# Patient Record
Sex: Female | Born: 1940
Health system: Southern US, Community
[De-identification: ages and names within clinical notes are randomized; demographics above are authoritative.]

## PROBLEM LIST (undated history)

## (undated) DIAGNOSIS — E785 Hyperlipidemia, unspecified: Secondary | ICD-10-CM

## (undated) DIAGNOSIS — K219 Gastro-esophageal reflux disease without esophagitis: Secondary | ICD-10-CM

## (undated) DIAGNOSIS — R011 Cardiac murmur, unspecified: Secondary | ICD-10-CM

## (undated) DIAGNOSIS — H3530 Unspecified macular degeneration: Secondary | ICD-10-CM

## (undated) HISTORY — PX: COLONOSCOPY: SHX174

## (undated) HISTORY — DX: Gastro-esophageal reflux disease without esophagitis: K21.9

## (undated) HISTORY — DX: Unspecified macular degeneration: H35.30

## (undated) HISTORY — DX: Hyperlipidemia, unspecified: E78.5

---

## 2005-12-09 ENCOUNTER — Ambulatory Visit: Payer: Self-pay | Admitting: Family Medicine

## 2006-01-03 ENCOUNTER — Encounter: Admission: RE | Admit: 2006-01-03 | Discharge: 2006-01-03 | Payer: Self-pay | Admitting: Family Medicine

## 2006-01-25 ENCOUNTER — Ambulatory Visit: Payer: Self-pay | Admitting: Gastroenterology

## 2006-02-01 ENCOUNTER — Ambulatory Visit: Payer: Self-pay | Admitting: Gastroenterology

## 2006-04-26 ENCOUNTER — Ambulatory Visit: Payer: Self-pay | Admitting: Family Medicine

## 2006-04-26 LAB — CONVERTED CEMR LAB
ALT: 15 units/L (ref 0–40)
Bilirubin, Direct: 0.1 mg/dL (ref 0.0–0.3)
Total Protein: 6.7 g/dL (ref 6.0–8.3)

## 2006-09-28 ENCOUNTER — Ambulatory Visit: Payer: Self-pay | Admitting: Family Medicine

## 2006-09-28 LAB — CONVERTED CEMR LAB
AST: 24 units/L (ref 0–37)
Albumin: 4.2 g/dL (ref 3.5–5.2)
Chloride: 106 meq/L (ref 96–112)
GFR calc non Af Amer: 76 mL/min
Potassium: 5 meq/L (ref 3.5–5.1)
Sodium: 143 meq/L (ref 135–145)

## 2006-10-02 ENCOUNTER — Encounter: Payer: Self-pay | Admitting: Family Medicine

## 2006-10-25 ENCOUNTER — Ambulatory Visit: Payer: Self-pay | Admitting: Internal Medicine

## 2007-01-09 ENCOUNTER — Telehealth (INDEPENDENT_AMBULATORY_CARE_PROVIDER_SITE_OTHER): Payer: Self-pay | Admitting: *Deleted

## 2007-01-16 ENCOUNTER — Ambulatory Visit: Payer: Self-pay | Admitting: Family Medicine

## 2007-01-19 LAB — CONVERTED CEMR LAB
Bilirubin, Direct: 0.1 mg/dL (ref 0.0–0.3)
Cholesterol: 169 mg/dL (ref 0–200)
HDL: 48.6 mg/dL (ref >39.0)
LDL Cholesterol: 90 mg/dL (ref 0–99)
Total CHOL/HDL Ratio: 3.5
Total Protein: 6.6 g/dL (ref 6.0–8.3)

## 2007-01-30 ENCOUNTER — Encounter: Admission: RE | Admit: 2007-01-30 | Discharge: 2007-01-30 | Payer: Self-pay | Admitting: Family Medicine

## 2007-02-01 ENCOUNTER — Encounter (INDEPENDENT_AMBULATORY_CARE_PROVIDER_SITE_OTHER): Payer: Self-pay | Admitting: *Deleted

## 2007-02-13 ENCOUNTER — Ambulatory Visit: Payer: Self-pay | Admitting: Family Medicine

## 2007-02-13 DIAGNOSIS — E785 Hyperlipidemia, unspecified: Secondary | ICD-10-CM | POA: Insufficient documentation

## 2007-04-18 ENCOUNTER — Ambulatory Visit: Payer: Self-pay | Admitting: Family Medicine

## 2007-04-23 LAB — CONVERTED CEMR LAB
Direct LDL: 168.7 mg/dL
HDL: 45.9 mg/dL (ref >39.0)
Total Bilirubin: 0.8 mg/dL (ref 0.3–1.2)
Total Protein: 6.2 g/dL (ref 6.0–8.3)
Triglycerides: 156 mg/dL — ABNORMAL HIGH (ref 0–149)

## 2007-07-30 ENCOUNTER — Ambulatory Visit: Payer: Self-pay | Admitting: Family Medicine

## 2007-08-06 ENCOUNTER — Telehealth (INDEPENDENT_AMBULATORY_CARE_PROVIDER_SITE_OTHER): Payer: Self-pay | Admitting: *Deleted

## 2007-08-06 LAB — CONVERTED CEMR LAB
ALT: 29 units/L (ref 0–35)
AST: 29 units/L (ref 0–37)
Bilirubin, Direct: 0.1 mg/dL (ref 0.0–0.3)
Cholesterol: 210 mg/dL (ref 0–200)
Total Protein: 6.7 g/dL (ref 6.0–8.3)
VLDL: 24 mg/dL (ref 0–40)

## 2007-11-30 ENCOUNTER — Telehealth (INDEPENDENT_AMBULATORY_CARE_PROVIDER_SITE_OTHER): Payer: Self-pay | Admitting: *Deleted

## 2008-02-07 ENCOUNTER — Encounter: Admission: RE | Admit: 2008-02-07 | Discharge: 2008-02-07 | Payer: Self-pay | Admitting: Family Medicine

## 2008-02-11 ENCOUNTER — Encounter (INDEPENDENT_AMBULATORY_CARE_PROVIDER_SITE_OTHER): Payer: Self-pay | Admitting: *Deleted

## 2008-03-19 ENCOUNTER — Telehealth (INDEPENDENT_AMBULATORY_CARE_PROVIDER_SITE_OTHER): Payer: Self-pay | Admitting: *Deleted

## 2008-04-15 ENCOUNTER — Ambulatory Visit: Payer: Self-pay | Admitting: Family Medicine

## 2008-04-15 LAB — CONVERTED CEMR LAB
Albumin: 3.9 g/dL (ref 3.5–5.2)
Alkaline Phosphatase: 66 units/L (ref 39–117)
Total Protein: 6.7 g/dL (ref 6.0–8.3)

## 2008-05-12 ENCOUNTER — Telehealth (INDEPENDENT_AMBULATORY_CARE_PROVIDER_SITE_OTHER): Payer: Self-pay | Admitting: *Deleted

## 2008-05-15 ENCOUNTER — Other Ambulatory Visit: Admission: RE | Admit: 2008-05-15 | Discharge: 2008-05-15 | Payer: Self-pay | Admitting: Family Medicine

## 2008-05-15 ENCOUNTER — Encounter: Payer: Self-pay | Admitting: Family Medicine

## 2008-05-15 ENCOUNTER — Ambulatory Visit: Payer: Self-pay | Admitting: Family Medicine

## 2008-05-16 ENCOUNTER — Encounter: Payer: Self-pay | Admitting: Family Medicine

## 2008-05-19 ENCOUNTER — Encounter (INDEPENDENT_AMBULATORY_CARE_PROVIDER_SITE_OTHER): Payer: Self-pay | Admitting: *Deleted

## 2008-05-20 ENCOUNTER — Encounter (INDEPENDENT_AMBULATORY_CARE_PROVIDER_SITE_OTHER): Payer: Self-pay | Admitting: *Deleted

## 2008-05-26 ENCOUNTER — Ambulatory Visit: Payer: Self-pay | Admitting: Family Medicine

## 2008-05-26 LAB — CONVERTED CEMR LAB
Albumin: 4.2 g/dL (ref 3.5–5.2)
Basophils Absolute: 0.1 10*3/uL (ref 0.0–0.1)
Basophils Relative: 1 % (ref 0.0–3.0)
CRP, High Sensitivity: <1 — ABNORMAL LOW (ref 0.00–5.00)
Calcium: 9.5 mg/dL (ref 8.4–10.5)
Chloride: 100 meq/L (ref 96–112)
Creatinine, Ser: 0.7 mg/dL (ref 0.4–1.2)
Direct LDL: 188.1 mg/dL
Eosinophils Absolute: 0.2 10*3/uL (ref 0.0–0.7)
GFR calc Af Amer: 107 mL/min
GFR calc non Af Amer: 88 mL/min
HCT: 42.5 % (ref 36.0–46.0)
HDL: 55.1 mg/dL (ref >39.0)
MCHC: 34.2 g/dL (ref 30.0–36.0)
MCV: 93.5 fL (ref 78.0–100.0)
Monocytes Absolute: 0.6 10*3/uL (ref 0.1–1.0)
Neutro Abs: 4 10*3/uL (ref 1.4–7.7)
Neutrophils Relative %: 58.7 % (ref 43.0–77.0)
RBC: 4.55 M/uL (ref 3.87–5.11)
TSH: 3.14 microintl units/mL (ref 0.35–5.50)
Total Bilirubin: 0.9 mg/dL (ref 0.3–1.2)
Total CK: 88 units/L (ref 7–177)

## 2008-07-25 ENCOUNTER — Telehealth: Payer: Self-pay | Admitting: Family Medicine

## 2008-08-14 ENCOUNTER — Ambulatory Visit: Payer: Self-pay | Admitting: Family Medicine

## 2008-08-14 DIAGNOSIS — R109 Unspecified abdominal pain: Secondary | ICD-10-CM | POA: Insufficient documentation

## 2008-08-14 DIAGNOSIS — R079 Chest pain, unspecified: Secondary | ICD-10-CM

## 2008-08-14 LAB — CONVERTED CEMR LAB
Glucose, Urine, Semiquant: NEGATIVE
Nitrite: NEGATIVE
Specific Gravity, Urine: 1.01

## 2008-08-15 ENCOUNTER — Encounter: Payer: Self-pay | Admitting: Family Medicine

## 2008-08-20 ENCOUNTER — Encounter: Admission: RE | Admit: 2008-08-20 | Discharge: 2008-08-20 | Payer: Self-pay | Admitting: Family Medicine

## 2008-08-24 LAB — CONVERTED CEMR LAB
Alkaline Phosphatase: 69 units/L (ref 39–117)
Basophils Absolute: 0 10*3/uL (ref 0.0–0.1)
Bilirubin, Direct: 0.1 mg/dL (ref 0.0–0.3)
Calcium: 9.5 mg/dL (ref 8.4–10.5)
GFR calc Af Amer: 107 mL/min
Glucose, Bld: 93 mg/dL (ref 70–99)
HCT: 40.9 % (ref 36.0–46.0)
Lipase: 35 units/L (ref 11.0–59.0)
Lymphocytes Relative: 29.3 % (ref 12.0–46.0)
MCHC: 34.5 g/dL (ref 30.0–36.0)
Monocytes Absolute: 0.3 10*3/uL (ref 0.1–1.0)
Monocytes Relative: 3.5 % (ref 3.0–12.0)
Neutro Abs: 4.5 10*3/uL (ref 1.4–7.7)
Platelets: 237 10*3/uL (ref 150–400)
Potassium: 4.4 meq/L (ref 3.5–5.1)
RDW: 12.5 % (ref 11.5–14.6)
Sodium: 140 meq/L (ref 135–145)
Total Bilirubin: 0.6 mg/dL (ref 0.3–1.2)
Total Protein: 6.8 g/dL (ref 6.0–8.3)

## 2008-08-25 ENCOUNTER — Telehealth (INDEPENDENT_AMBULATORY_CARE_PROVIDER_SITE_OTHER): Payer: Self-pay | Admitting: *Deleted

## 2008-08-25 ENCOUNTER — Encounter (INDEPENDENT_AMBULATORY_CARE_PROVIDER_SITE_OTHER): Payer: Self-pay | Admitting: *Deleted

## 2008-08-26 ENCOUNTER — Ambulatory Visit: Payer: Self-pay | Admitting: Family Medicine

## 2008-08-27 LAB — CONVERTED CEMR LAB
ALT: 26 units/L (ref 0–35)
AST: 27 units/L (ref 0–37)
Alkaline Phosphatase: 68 units/L (ref 39–117)
Bilirubin, Direct: 0.1 mg/dL (ref 0.0–0.3)
Total Bilirubin: 0.7 mg/dL (ref 0.3–1.2)

## 2008-08-28 ENCOUNTER — Encounter (INDEPENDENT_AMBULATORY_CARE_PROVIDER_SITE_OTHER): Payer: Self-pay | Admitting: *Deleted

## 2008-09-02 LAB — HM COLONOSCOPY

## 2008-09-04 ENCOUNTER — Ambulatory Visit: Payer: Self-pay | Admitting: Family Medicine

## 2008-09-04 DIAGNOSIS — J029 Acute pharyngitis, unspecified: Secondary | ICD-10-CM

## 2008-09-08 ENCOUNTER — Telehealth (INDEPENDENT_AMBULATORY_CARE_PROVIDER_SITE_OTHER): Payer: Self-pay | Admitting: *Deleted

## 2008-09-09 ENCOUNTER — Ambulatory Visit: Payer: Self-pay | Admitting: Family Medicine

## 2008-11-24 ENCOUNTER — Telehealth (INDEPENDENT_AMBULATORY_CARE_PROVIDER_SITE_OTHER): Payer: Self-pay | Admitting: *Deleted

## 2009-01-28 ENCOUNTER — Ambulatory Visit: Payer: Self-pay | Admitting: Family Medicine

## 2009-01-28 DIAGNOSIS — N390 Urinary tract infection, site not specified: Secondary | ICD-10-CM | POA: Insufficient documentation

## 2009-01-28 LAB — CONVERTED CEMR LAB
Nitrite: NEGATIVE
Protein, U semiquant: NEGATIVE
Specific Gravity, Urine: 1.01
Urobilinogen, UA: 0.2

## 2009-01-29 ENCOUNTER — Encounter: Payer: Self-pay | Admitting: Family Medicine

## 2009-01-29 LAB — CONVERTED CEMR LAB: WBC, UA: NONE SEEN cells/hpf (ref <3)

## 2009-02-01 LAB — CONVERTED CEMR LAB
ALT: 23 units/L (ref 0–35)
Albumin: 4.2 g/dL (ref 3.5–5.2)
BUN: 15 mg/dL (ref 6–23)
Bilirubin, Direct: 0.1 mg/dL (ref 0.0–0.3)
Chloride: 106 meq/L (ref 96–112)
GFR calc non Af Amer: 88.16 mL/min (ref >60)
Potassium: 4.2 meq/L (ref 3.5–5.1)
Sodium: 142 meq/L (ref 135–145)
Total Protein: 7.2 g/dL (ref 6.0–8.3)

## 2009-02-02 ENCOUNTER — Telehealth (INDEPENDENT_AMBULATORY_CARE_PROVIDER_SITE_OTHER): Payer: Self-pay | Admitting: *Deleted

## 2009-02-12 ENCOUNTER — Encounter (INDEPENDENT_AMBULATORY_CARE_PROVIDER_SITE_OTHER): Payer: Self-pay | Admitting: *Deleted

## 2009-02-12 ENCOUNTER — Telehealth (INDEPENDENT_AMBULATORY_CARE_PROVIDER_SITE_OTHER): Payer: Self-pay | Admitting: *Deleted

## 2009-02-16 ENCOUNTER — Ambulatory Visit: Payer: Self-pay | Admitting: Family Medicine

## 2009-02-16 LAB — CONVERTED CEMR LAB
Nitrite: NEGATIVE
Specific Gravity, Urine: 1.01

## 2009-02-18 ENCOUNTER — Encounter: Admission: RE | Admit: 2009-02-18 | Discharge: 2009-02-18 | Payer: Self-pay | Admitting: Family Medicine

## 2009-02-19 ENCOUNTER — Encounter (INDEPENDENT_AMBULATORY_CARE_PROVIDER_SITE_OTHER): Payer: Self-pay | Admitting: *Deleted

## 2009-03-20 ENCOUNTER — Telehealth (INDEPENDENT_AMBULATORY_CARE_PROVIDER_SITE_OTHER): Payer: Self-pay | Admitting: *Deleted

## 2009-04-28 ENCOUNTER — Ambulatory Visit: Payer: Self-pay | Admitting: Family Medicine

## 2009-04-28 ENCOUNTER — Telehealth (INDEPENDENT_AMBULATORY_CARE_PROVIDER_SITE_OTHER): Payer: Self-pay | Admitting: *Deleted

## 2009-04-28 DIAGNOSIS — B372 Candidiasis of skin and nail: Secondary | ICD-10-CM | POA: Insufficient documentation

## 2009-04-28 LAB — CONVERTED CEMR LAB
Ketones, urine, test strip: NEGATIVE
Nitrite: NEGATIVE
Urobilinogen, UA: 0.2
WBC Urine, dipstick: NEGATIVE

## 2009-06-29 ENCOUNTER — Telehealth (INDEPENDENT_AMBULATORY_CARE_PROVIDER_SITE_OTHER): Payer: Self-pay | Admitting: *Deleted

## 2009-07-22 ENCOUNTER — Ambulatory Visit: Payer: Self-pay | Admitting: Family Medicine

## 2009-07-22 DIAGNOSIS — R300 Dysuria: Secondary | ICD-10-CM

## 2009-07-22 LAB — CONVERTED CEMR LAB
Bilirubin Urine: NEGATIVE
Ketones, urine, test strip: NEGATIVE
Nitrite: POSITIVE
Protein, U semiquant: NEGATIVE
Urobilinogen, UA: 0.2

## 2009-07-23 ENCOUNTER — Encounter: Payer: Self-pay | Admitting: Family Medicine

## 2009-07-23 LAB — CONVERTED CEMR LAB
ALT: 20 units/L (ref 0–35)
AST: 25 units/L (ref 0–37)
Alkaline Phosphatase: 71 units/L (ref 39–117)
Bilirubin, Direct: 0 mg/dL (ref 0.0–0.3)
Total Bilirubin: 0.8 mg/dL (ref 0.3–1.2)

## 2009-07-27 ENCOUNTER — Telehealth (INDEPENDENT_AMBULATORY_CARE_PROVIDER_SITE_OTHER): Payer: Self-pay | Admitting: *Deleted

## 2009-08-04 ENCOUNTER — Telehealth: Payer: Self-pay | Admitting: Family Medicine

## 2009-08-24 ENCOUNTER — Telehealth (INDEPENDENT_AMBULATORY_CARE_PROVIDER_SITE_OTHER): Payer: Self-pay | Admitting: *Deleted

## 2009-09-07 ENCOUNTER — Telehealth: Payer: Self-pay | Admitting: Family Medicine

## 2009-10-13 ENCOUNTER — Telehealth: Payer: Self-pay | Admitting: Family Medicine

## 2009-11-27 ENCOUNTER — Telehealth: Payer: Self-pay | Admitting: Family Medicine

## 2009-11-27 DIAGNOSIS — D239 Other benign neoplasm of skin, unspecified: Secondary | ICD-10-CM | POA: Insufficient documentation

## 2009-12-04 ENCOUNTER — Ambulatory Visit: Payer: Self-pay | Admitting: Family Medicine

## 2009-12-07 ENCOUNTER — Encounter: Payer: Self-pay | Admitting: Family Medicine

## 2009-12-07 LAB — CONVERTED CEMR LAB
ALT: 16 units/L (ref 0–35)
Albumin: 4.1 g/dL (ref 3.5–5.2)
Total Protein: 6.5 g/dL (ref 6.0–8.3)

## 2009-12-21 ENCOUNTER — Telehealth (INDEPENDENT_AMBULATORY_CARE_PROVIDER_SITE_OTHER): Payer: Self-pay | Admitting: *Deleted

## 2010-02-09 ENCOUNTER — Telehealth: Payer: Self-pay | Admitting: Family Medicine

## 2010-02-11 LAB — HM MAMMOGRAPHY: HM Mammogram: NEGATIVE

## 2010-02-19 ENCOUNTER — Encounter: Admission: RE | Admit: 2010-02-19 | Discharge: 2010-02-19 | Payer: Self-pay | Admitting: Family Medicine

## 2010-03-11 ENCOUNTER — Telehealth (INDEPENDENT_AMBULATORY_CARE_PROVIDER_SITE_OTHER): Payer: Self-pay | Admitting: *Deleted

## 2010-05-10 ENCOUNTER — Ambulatory Visit (HOSPITAL_BASED_OUTPATIENT_CLINIC_OR_DEPARTMENT_OTHER): Admission: RE | Admit: 2010-05-10 | Discharge: 2010-05-10 | Payer: Self-pay | Admitting: Family Medicine

## 2010-05-10 ENCOUNTER — Ambulatory Visit: Payer: Self-pay | Admitting: Diagnostic Radiology

## 2010-05-10 ENCOUNTER — Ambulatory Visit: Payer: Self-pay | Admitting: Family Medicine

## 2010-05-10 DIAGNOSIS — M25559 Pain in unspecified hip: Secondary | ICD-10-CM

## 2010-05-17 ENCOUNTER — Ambulatory Visit: Payer: Self-pay | Admitting: Family Medicine

## 2010-06-02 ENCOUNTER — Telehealth: Payer: Self-pay | Admitting: Family Medicine

## 2010-06-04 ENCOUNTER — Encounter: Payer: Self-pay | Admitting: Family Medicine

## 2010-06-14 ENCOUNTER — Ambulatory Visit: Payer: Self-pay | Admitting: Family Medicine

## 2010-06-15 ENCOUNTER — Ambulatory Visit: Payer: Self-pay | Admitting: Family Medicine

## 2010-06-15 DIAGNOSIS — E039 Hypothyroidism, unspecified: Secondary | ICD-10-CM

## 2010-06-16 LAB — CONVERTED CEMR LAB
Free T4: 0.9 ng/dL (ref 0.60–1.60)
T3, Free: 3.3 pg/mL (ref 2.3–4.2)

## 2010-07-15 ENCOUNTER — Telehealth (INDEPENDENT_AMBULATORY_CARE_PROVIDER_SITE_OTHER): Payer: Self-pay | Admitting: *Deleted

## 2010-08-03 NOTE — Progress Notes (Signed)
Summary: samples  Phone Note Call from Patient Call back at Home Phone [phone]   Caller: Patient Summary of Call: patient is scheduled for lab [phone] - she wants to know if she can have sample of crestor 10 mg - she only has a few left  patient wants referral to dermatol - she has a mole - she said she discussed this with drllowne on last visit - i explained if it wasnt noted she may need appt -- i didnt see a mention of a mole in notes   Initial call taken by: Okey Regal Spring,  Nov 27, 2009 8:45 AM  Follow-up for Phone Call        reviewed OV notes did not see were mole discuss. Ok to refer to derm ? ok to give samples. pls advise..............Marland KitchenFelecia Deloach CMA  Nov 27, 2009 9:40 AM   Additional Follow-up for Phone Call Additional follow up Details #1::        ok to give samples referral put in Additional Follow-up by: Loreen Freud DO,  Nov 27, 2009 10:28 AM  New Problems: MOLE (ICD-216.9)   Additional Follow-up for Phone Call Additional follow up Details #2::    pt aware samples placed up front, awaiting appt info for referral.......Marland KitchenFelecia Deloach CMA  Nov 27, 2009 11:16 AM   New Problems: MOLE (ICD-216.9)

## 2010-08-03 NOTE — Progress Notes (Signed)
Summary: lab results  Phone Note Outgoing Call   Call placed by: [facility] CMA,  July 27, 2009 9:54 AM Summary of Call: left message to call  office...............Marland KitchenFelecia Deloach CMA  July 27, 2009 9:54 AM   + uti --on cipro  Follow-up for Phone Call        pt aware............Marland KitchenFelecia Deloach CMA  July 27, 2009 10:08 AM

## 2010-08-03 NOTE — Progress Notes (Signed)
Summary: refill - ambien  Phone Note Refill Request Message from:  Fax from Pharmacy on June 02, 2010 8:15 AM  Refills Requested: Medication #1:  AMBIEN 10 MG  TABS 1 every 3rd night as needed. [facility] [place] - fax [phone]  Initial call taken by: Okey Regal Spring,  June 02, 2010 8:15 AM  Follow-up for Phone Call        last seen 02/09/10 and filled 05/10/10.Marland KitchenMarland Kitchenplease advise Follow-up by: Almeta Monas CMA Duncan Dull),  June 02, 2010 8:48 AM  Additional Follow-up for Phone Call Additional follow up Details #1::        if filled on 11 / 7 she is taking it every night and she should not be----  med list says refill was 8/9-- if that is correct--ok to refill Additional Follow-up by: Loreen Freud DO,  June 02, 2010 9:12 AM    Additional Follow-up for Phone Call Additional follow up Details #2::    sorry last filled 02/09/10 and seen 05/10/10 Follow-up by: Almeta Monas CMA Duncan Dull),  June 02, 2010 10:00 AM  Additional Follow-up for Phone Call Additional follow up Details #3:: Details for Additional Follow-up Action Taken: ok to refill x1 Additional Follow-up by: Loreen Freud DO,  June 02, 2010 11:12 AM  Prescriptions: AMBIEN 10 MG  TABS (ZOLPIDEM TARTRATE) 1 every 3rd night as needed.  #30 x 0   Entered by:   Almeta Monas CMA (AAMA)   Authorized by:   Loreen Freud DO   Signed by:   Almeta Monas CMA (AAMA) on 06/02/2010   Method used:   Printed then faxed to ...       [facility] [phone]* (retail)       [address]       [place], [place]  [address]       Ph: [phone]       Fax: [phone]   RxID:   [id number]

## 2010-08-03 NOTE — Assessment & Plan Note (Signed)
Summary: ROV/labwork/drb   Vital Signs:  Patient profile:   70 year old female Height:      65 inches Weight:      150.13 pounds BMI:     25.07 Temp:     97.2 degrees F oral Pulse rate:   82 / minute Pulse rhythm:   regular BP sitting:   128 / 78  (left arm) Cuff size:   regular  Vitals Entered By: Army Fossa CMA (July 22, 2009 9:42 AM) CC: Routine follow-up, labwork- pt is fasting. Everything going okay. needs refills. , Dysuria   History of Present Illness:  Hyperlipidemia follow-up      This is a 70 year old woman who presents for Hyperlipidemia follow-up.  The patient denies muscle aches, GI upset, abdominal pain, flushing, itching, constipation, diarrhea, and fatigue.  The patient denies the following symptoms: chest pain/pressure, exercise intolerance, dypsnea, palpitations, syncope, and pedal edema.  Compliance with medications (by patient report) has been near 100%.  Dietary compliance has been good.  The patient reports no exercise.    Dysuria      The patient also presents with Dysuria.  The patient complains of burning with urination and urinary frequency, but denies urgency, hematuria, vaginal discharge, vaginal itching, and vaginal sores.  The patient denies the following associated symptoms: nausea, vomiting, fever, shaking chills, flank pain, abdominal pain, back pain, pelvic pain, and arthralgias.  The patient denies the following risk factors: diabetes, prior antibiotics, immunosuppression, history of GU anomaly, history of pyelonephritis, pregnancy, history of STD, and analgesic abuse.    Allergies (verified): No Known Drug Allergies  Past History:  Past medical, surgical, family and social histories (including risk factors) reviewed for relevance to current acute and chronic problems.  Past Medical History: Reviewed history from 02/13/2007 and no changes required. Hyperlipidemia  Past Surgical History: Reviewed history from 05/15/2008 and no changes  required. Denies surgical history  Family History: Reviewed history from 05/15/2008 and no changes required. none  Social History: Reviewed history from 05/15/2008 and no changes required. Retired Married Never Smoked Alcohol use-yes Drug use-no Regular exercise-yes  Review of Systems      See HPI  Physical Exam  General:  Well-developed,well-nourished,in no acute distress; alert,appropriate and cooperative throughout examination Lungs:  Normal respiratory effort, chest expands symmetrically. Lungs are clear to auscultation, no crackles or wheezes. Heart:  Normal rate and regular rhythm. S1 and S2 normal without gallop, murmur, click, rub or other extra sounds. Extremities:  No clubbing, cyanosis, edema, or deformity noted with normal full range of motion of all joints.   Psych:  Oriented X3 and normally interactive.     Impression & Recommendations:  Problem # 1:  HYPERLIPIDEMIA (ICD-272.4)  Her updated medication list for this problem includes:    Simvastatin 40 Mg Tabs (Simvastatin) .Marland Kitchen... Take 1 tab once daily at bedtime. needs office visit before additional refills.  Orders: Venipuncture (98119) TLB-Hepatic/Liver Function Pnl (80076-HEPATIC) T-NMR, Lipoprofile (14782-95621) UA Dipstick w/o Micro (manual) (30865)  Labs Reviewed: SGOT: 29 (01/28/2009)   SGPT: 23 (01/28/2009)   HDL:55.1 (05/15/2008), 55.4 (07/30/2007)  LDL:DEL (05/15/2008), DEL (07/30/2007)  Chol:294 (05/15/2008), 210 (07/30/2007)  Trig:124 (05/15/2008), 121 (07/30/2007)  Problem # 2:  DYSURIA (ICD-788.1)  Her updated medication list for this problem includes:    Cipro 500 Mg Tabs (Ciprofloxacin hcl) .Marland Kitchen... 1 by mouth two times a day  Encouraged to push clear liquids, get enough rest, and take acetaminophen as needed. To be seen in 10 days if no  improvement, sooner if worse.  Orders: UA Dipstick w/o Micro (manual) (65784)  Complete Medication List: 1)  Ambien 10 Mg Tabs (Zolpidem tartrate) .Marland Kitchen..  1 every 3rd night as needed. 2)  Simvastatin 40 Mg Tabs (Simvastatin) .... Take 1 tab once daily at bedtime. needs office visit before additional refills. 3)  Cipro 500 Mg Tabs (Ciprofloxacin hcl) .Marland Kitchen.. 1 by mouth two times a day  Other Orders: T-Culture, Urine ([id number]) Prescriptions: CIPRO 500 MG TABS (CIPROFLOXACIN HCL) 1 by mouth two times a day  #10 x 0   Entered and Authorized by:   Loreen Freud DO   Signed by:   Loreen Freud DO on 07/22/2009   Method used:   Electronically to        [facility] [phone]* (retail)       [address]       [place], [place]  [address]       Ph: [phone]       Fax: [phone]   RxID:   [id number]   Laboratory Results   Urine Tests    Routine Urinalysis   Color: yellow Appearance: Clear Glucose: negative   (Normal Range: Negative) Bilirubin: negative   (Normal Range: Negative) Ketone: negative   (Normal Range: Negative) Spec. Gravity: 1.015   (Normal Range: 1.003-1.035) Blood: small   (Normal Range: Negative) pH: 6.5   (Normal Range: 5.0-8.0) Protein: negative   (Normal Range: Negative) Urobilinogen: 0.2   (Normal Range: 0-1) Nitrite: positive   (Normal Range: Negative) Leukocyte Esterace: negative   (Normal Range: Negative)    Comments: Army Fossa CMA  July 22, 2009 10:06 AM

## 2010-08-03 NOTE — Progress Notes (Signed)
Summary: med reaction  Phone Note Call from Patient Call back at Home Phone [phone]   Caller: Mom Summary of Call: pt states that she was given sample of crestor. pt is on her 3rd week of samples and is experience pain in her legs. pt states that she may not be able to tolerate a rx of this med. pls advise..............Marland KitchenFelecia Deloach CMA  August 24, 2009 11:19 AM   Follow-up for Phone Call        break them in half and take every other day---see if helps Follow-up by: Loreen Freud DO,  August 24, 2009 12:04 PM  Additional Follow-up for Phone Call Additional follow up Details #1::        left message to call  office............Marland KitchenFelecia Deloach CMA  August 24, 2009 12:24 PM  pt return call left message to call office...........Marland KitchenFelecia Deloach CMA  August 24, 2009 2:24 PM  pt aware, pt given 2 week samples of 10mg  and instructed to take 1 tab every other day. pt will pick samples up tomorrow............Marland KitchenFelecia Deloach CMA  August 24, 2009 3:48 PM

## 2010-08-03 NOTE — Progress Notes (Signed)
Summary: Refill Request  Phone Note Refill Request Message from:  [facility] Fax #: [phone]  Refills Requested: Medication #1:  AMBIEN 10 MG  TABS 1 every 3rd night as needed.   Dosage confirmed as above?Dosage Confirmed   Brand Name Necessary? No   Supply Requested: 1 month   Last Refilled: 06/29/2009 Next Appointment Scheduled: none Initial call taken by: Harold Barban,  September 07, 2009 10:09 AM  Follow-up for Phone Call        last ov- 07/22/09 ok to refill x1 Follow-up by: Loreen Freud DO,  September 07, 2009 11:23 AM    Prescriptions: AMBIEN 10 MG  TABS (ZOLPIDEM TARTRATE) 1 every 3rd night as needed.  #10 x 0   Entered by:   Army Fossa CMA   Authorized by:   Loreen Freud DO   Signed by:   Army Fossa CMA on 09/07/2009   Method used:   Printed then faxed to ...       [facility] [phone]* (retail)       [address]       [place], [place]  [address]       Ph: [phone]       Fax: [phone]   RxID:   [id number]

## 2010-08-03 NOTE — Progress Notes (Signed)
Summary: Lab Results  Phone Note Outgoing Call   Call placed by: Army Fossa CMA,  December 21, 2009 11:51 AM Reason for Call: Discuss lab or test results Summary of Call: Regarding lab results, no answering machine  con't current meds---LDL particle # high---add niaspan 500 mg #30  2 refills----  1 by mouth at bedtime---take with aspirin or tylenol to decrease flushing and take with low fat snack.  Recheck 3 months----272.4 NMR, hep Signed by Loreen Freud DO on 12/18/2009 at 10:17 PM   Follow-up for Phone Call        Left message for pt to call back. Army Fossa CMA  December 23, 2009 10:11 AM   Additional Follow-up for Phone Call Additional follow up Details #1::        PATIENT DID  RETUREN YOUR CALL AT 12:10---WILL CALL YOU BACK AFTER 1:00PM TODAY Additional Follow-up by: Jerolyn Shin,  December 23, 2009 12:11 PM    Additional Follow-up for Phone Call Additional follow up Details #2::    Spoke with pt she is aware of results and medication. Army Fossa CMA  December 23, 2009 2:38 PM mailed pt a copy in the mail. Army Fossa CMA  December 23, 2009 2:38 PM   New/Updated Medications: NIASPAN 500 MG CR-TABS (NIACIN (ANTIHYPERLIPIDEMIC)) 1 by mouth at bedtime. )Prescriptions: NIASPAN 500 MG CR-TABS (NIACIN (ANTIHYPERLIPIDEMIC)) 1 by mouth at bedtime.  #30 x 2   Entered by:   Army Fossa CMA   Authorized by:   Loreen Freud DO   Signed by:   Army Fossa CMA on 12/23/2009   Method used:   Electronically to        [facility] [phone]* (retail)       [address]       [place], [place]  [address]       Ph: [phone]       Fax: [phone]   RxID:   [id number]

## 2010-08-03 NOTE — Progress Notes (Signed)
Summary: Meds  Phone Note Outgoing Call   Summary of Call: I spoke with pt about her labwork- she states that Crestor is to expensive. Please advise. Army Fossa CMA  August 04, 2009 4:51 PM   Follow-up for Phone Call        give samples and pt assistance form  Follow-up by: Loreen Freud DO,  August 04, 2009 5:12 PM  Additional Follow-up for Phone Call Additional follow up Details #1::        Pt is going to pick everything up today. Army Fossa CMA  August 05, 2009 8:45 AM

## 2010-08-03 NOTE — Progress Notes (Signed)
Summary: Refill(lmom 4/12)  Phone Note Refill Request   Refills Requested: Medication #1:  AMBIEN 10 MG  TABS 1 every 3rd night as needed.   Supply Requested: 1 month   Last Refilled: 09/07/2009  Medication #2:  CRESTOR 10 MG TABS 1 by mouth every other day.. Pt states she would like a 30 day supply because she is having to pay the same price for 10 as she would for 30. Please advise.  Initial call taken by: Army Fossa CMA,  October 13, 2009 8:30 AM  Follow-up for Phone Call        ok to give #30 on ambien Follow-up by: Loreen Freud DO,  October 13, 2009 11:27 AM  Additional Follow-up for Phone Call Additional follow up Details #1::        left message for pt, need to know what pharm. It is time for her to have labwork done also. Army Fossa CMA  October 13, 2009 11:31 AM     Additional Follow-up for Phone Call Additional follow up Details #2::    Pt is aware. Army Fossa CMA  October 13, 2009 12:52 PM   New/Updated Medications: CRESTOR 10 MG TABS (ROSUVASTATIN CALCIUM) 1 by mouth every other day. Prescriptions: CRESTOR 10 MG TABS (ROSUVASTATIN CALCIUM) 1 by mouth every other day.  #15 x 0   Entered by:   Army Fossa CMA   Authorized by:   Loreen Freud DO   Signed by:   Army Fossa CMA on 10/13/2009   Method used:   Printed then faxed to ...       [facility] [phone]* (retail)       [address]       [place], [place]  [address]       Ph: [phone]       Fax: [phone]   RxID:   [id number] AMBIEN 10 MG  TABS (ZOLPIDEM TARTRATE) 1 every 3rd night as needed.  #30 x 0   Entered by:   Army Fossa CMA   Authorized by:   Loreen Freud DO   Signed by:   Army Fossa CMA on 10/13/2009   Method used:   Printed then faxed to ...       [facility]  [facility] [phone]* (retail)       [address]       [place], [place]  [address]       Ph: [phone]       Fax: [phone]   RxID:    [id number]

## 2010-08-03 NOTE — Assessment & Plan Note (Signed)
Summary: FOR HIP PAIN//PH   Vital Signs:  Patient profile:   70 year old female Height:      65 inches Weight:      150.6 pounds BMI:     25.15 Pulse rate:   68 / minute Pulse rhythm:   regular BP sitting:   124 / 70  (left arm) Cuff size:   regular  Vitals Entered By: Almeta Monas CMA Duncan Dull) (May 10, 2010 1:37 PM) CC: x1week ago pt fell down the stairs c/o right hip and buttocks pain Pain Assessment Patient in pain? yes     Location: hip Intensity: 8 Type: aching Onset of pain  Gradual   History of Present Illness:  Injury      This is a 70 year old woman who presents with An injury.  The symptoms began 1 week ago.  Pt fell about 1 week ago down about 10 steps and now she c/o R hip and buttock pain.  The patient reports injury to the right hip, but denies injury to the head, face, neck, left arm, right arm, left elbow, right elbow, left forearm, right forearm, chest, back, abdomen, left hip, left thigh, right thigh, left knee, right knee, left leg, right leg, left ankle, right ankle, left foot, and right foot.  The patient denies swelling, redness, tenderness, increased warmth deformity, blood loss, numbness, weakness, loss of sensation, coolness of extremity, and loss of consciousness.  The patient denies the following risk factors for significant bleeding: aspirin use, anticoagulant use, and history of bleeding disorder.  Screening for risk of abuse was negative.    Current Medications (verified): 1)  Ambien 10 Mg  Tabs (Zolpidem Tartrate) .Marland Kitchen.. 1 Every 3rd Night As Needed. 2)  Crestor 10 Mg Tabs (Rosuvastatin Calcium) .... As Directed 3)  Mobic 15 Mg Tabs (Meloxicam) .... 1/2 -1 By Mouth Once Daily As Needed  Pain  Allergies (verified): 1)  ! Niaspan (Niacin (Antihyperlipidemic))  Past History:  Past Medical History: Last updated: 02/13/2007 Hyperlipidemia  Past Surgical History: Last updated: 05/15/2008 Denies surgical history  Family History: Last  updated: 05/15/2008 none  Social History: Last updated: 05/15/2008 Retired Married Never Smoked Alcohol use-yes Drug use-no Regular exercise-yes  Risk Factors: Alcohol Use: <1 (05/15/2008) Caffeine Use: 2 (05/15/2008) Exercise: yes (05/15/2008)  Risk Factors: Smoking Status: never (05/15/2008)  Family History: Reviewed history from 05/15/2008 and no changes required. none  Social History: Reviewed history from 05/15/2008 and no changes required. Retired Married Never Smoked Alcohol use-yes Drug use-no Regular exercise-yes  Review of Systems      See HPI  Physical Exam  General:  Well-developed,well-nourished,in no acute distress; alert,appropriate and cooperative throughout examination Msk:  normal ROM, no joint tenderness, no joint swelling, no joint warmth, no redness over joints, no joint deformities, and no crepitation.   Extremities:  No clubbing, cyanosis, edema, or deformity noted with normal full range of motion of all joints.   Neurologic:  alert & oriented X3 and strength normal in all extremities.   pain only with weight bearing Psych:  Cognition and judgment appear intact. Alert and cooperative with normal attention span and concentration. No apparent delusions, illusions, hallucinations   Impression & Recommendations:  Problem # 1:  HIP PAIN, RIGHT (ICD-719.45)  Orders: T-Hip Comp Right Min 2 views (73510TC) T-Lumbar Spine 2 Views (72100TC)  Discussed use of medications, application of heat or cold, and exercises.   Her updated medication list for this problem includes:    Mobic 15 Mg  Tabs (Meloxicam) .Marland Kitchen... 1/2 -1 by mouth once daily as needed  pain  Complete Medication List: 1)  Ambien 10 Mg Tabs (Zolpidem tartrate) .Marland Kitchen.. 1 every 3rd night as needed. 2)  Crestor 10 Mg Tabs (Rosuvastatin calcium) .... As directed 3)  Mobic 15 Mg Tabs (Meloxicam) .... 1/2 -1 by mouth once daily as needed  pain  Patient Instructions: 1)  fasting labs  272.4    [facility]-----ov 3 weeks after labs Prescriptions: MOBIC 15 MG TABS (MELOXICAM) 1/2 -1 by mouth once daily as needed  pain  #30 x 0   Entered and Authorized by:   Loreen Freud DO   Signed by:   Loreen Freud DO on 05/10/2010   Method used:   Electronically to        [facility] [phone]* (retail)       [address]       [place], [place]  [address]       Ph: [phone]       Fax: [phone]   RxID:   [id number]    Orders Added: 1)  T-Hip Comp Right Min 2 views [73510TC] 2)  T-Lumbar Spine 2 Views [72100TC] 3)  Est. Patient Level III [24401]

## 2010-08-03 NOTE — Progress Notes (Signed)
Summary: Change Direction  Phone Note Refill Request   Refills Requested: Medication #1:  CRESTOR 10 MG TABS 1 by mouth every other day.  Medication #2:  NIASPAN 500 MG CR-TABS 1 by mouth at bedtime.. Pt would like to get direction change on crestor to states once daily but she will still take every other day. pt would like to have direction change  due to med costing the same for #15 and #30. ok to change direction on med. Pt also wants to let you know that she was unable to take NIASPAN 500 MG due to it causing nausea. pls advise ok to change direction. pt uses [facility]...................Marland KitchenFelecia Deloach CMA  March 11, 2010 11:16 AM    Follow-up for Phone Call        I can not say on directions 1 a day---we can put as directed---but to put 1 a day is considered fraud. Follow-up by: Loreen Freud DO,  March 11, 2010 11:43 AM  Additional Follow-up for Phone Call Additional follow up Details #1::        Spoke with patient, patient ok'd above information and aware rx sent to pharmacy. Niaspan was removed from med list with the notation caused nausea Additional Follow-up by: Shonna Chock CMA,  March 11, 2010 3:58 PM   New Allergies: ! NIASPAN (NIACIN (ANTIHYPERLIPIDEMIC)) New/Updated Medications: CRESTOR 10 MG TABS (ROSUVASTATIN CALCIUM) as directed New Allergies: ! NIASPAN (NIACIN (ANTIHYPERLIPIDEMIC))Prescriptions: CRESTOR 10 MG TABS (ROSUVASTATIN CALCIUM) as directed  #30 x 2   Entered and Authorized by:   Loreen Freud DO   Signed by:   Loreen Freud DO on 03/11/2010   Method used:   Electronically to        [facility] [phone]* (retail)       [address]       [place], [place]  [address]       Ph: [phone]       Fax: [phone]   RxID:   [id number]

## 2010-08-03 NOTE — Progress Notes (Signed)
Summary: refill  Phone Note Refill Request Message from:  Fax from Pharmacy on February 09, 2010 12:58 PM  Refills Requested: Medication #1:  AMBIEN 10 MG  TABS 1 every 3rd night as needed. [facility] [place] - fax [phone]  Initial call taken by: Okey Regal Spring,  February 09, 2010 12:58 PM  Follow-up for Phone Call        last filled 10-13-09 #30, last ov 07-22-09....Marland KitchenMarland KitchenFelecia Deloach CMA  February 09, 2010 1:40 PM   Additional Follow-up for Phone Call Additional follow up Details #1::        ok to refill x Additional Follow-up by: Loreen Freud DO,  February 09, 2010 2:10 PM    Prescriptions: AMBIEN 10 MG  TABS (ZOLPIDEM TARTRATE) 1 every 3rd night as needed.  #30 x 0   Entered by:   Jeremy Johann CMA   Authorized by:   Loreen Freud DO   Signed by:   Jeremy Johann CMA on 02/09/2010   Method used:   Printed then faxed to ...       [facility] [phone]* (retail)       [address]       [place], [place]  [address]       Ph: [phone]       Fax: [phone]   RxID:   [id number]

## 2010-08-05 NOTE — Assessment & Plan Note (Signed)
Summary: REVIEW [facility]//KP   Vital Signs:  Patient profile:   70 year old female Weight:      152.0 pounds Pulse rate:   68 / minute Pulse rhythm:   regular BP sitting:   130 / 70  (left arm) Cuff size:   regular  Vitals Entered By: Almeta Monas CMA Duncan Dull) (June 15, 2010 1:51 PM) CC: review [facility]   History of Present Illness: Pt here to review [facility].  No complaints.    Current Medications (verified): 1)  Ambien 10 Mg  Tabs (Zolpidem Tartrate) .Marland Kitchen.. 1 Every 3rd Night As Needed. 2)  Crestor 10 Mg Tabs (Rosuvastatin Calcium) .... As Directed 3)  Mobic 15 Mg Tabs (Meloxicam) .... 1/2 -1 By Mouth Once Daily As Needed  Pain  Allergies (verified): 1)  ! Niaspan (Niacin (Antihyperlipidemic))  Past History:  Past Medical History: Last updated: 02/13/2007 Hyperlipidemia  Past Surgical History: Last updated: 05/15/2008 Denies surgical history  Family History: Last updated: 05/15/2008 none  Social History: Last updated: 05/15/2008 Retired Married Never Smoked Alcohol use-yes Drug use-no Regular exercise-yes  Risk Factors: Alcohol Use: <1 (05/15/2008) Caffeine Use: 2 (05/15/2008) Exercise: yes (05/15/2008)  Risk Factors: Smoking Status: never (05/15/2008)  Family History: Reviewed history from 05/15/2008 and no changes required. none  Social History: Reviewed history from 05/15/2008 and no changes required. Retired Married Never Smoked Alcohol use-yes Drug use-no Regular exercise-yes  Review of Systems      See HPI  Physical Exam  General:  Well-developed,well-nourished,in no acute distress; alert,appropriate and cooperative throughout examination Psych:  Cognition and judgment appear intact. Alert and cooperative with normal attention span and concentration. No apparent delusions, illusions, hallucinations   Impression & Recommendations:  Problem # 1:  HYPERLIPIDEMIA (ICD-272.4)  Her updated medication list  for this problem includes:    Crestor 10 Mg Tabs (Rosuvastatin calcium) .Marland Kitchen... As directed  Labs Reviewed: SGOT: 21 (12/04/2009)   SGPT: 16 (12/04/2009)   HDL:55.1 (05/15/2008), 55.4 (07/30/2007)  LDL:DEL (05/15/2008), DEL (07/30/2007)  Chol:294 (05/15/2008), 210 (07/30/2007)  Trig:124 (05/15/2008), 121 (07/30/2007)  Problem # 2:  HYPOTHYROIDISM (ICD-244.9)  Orders: Venipuncture ([address]) TLB-TSH (Thyroid Stimulating Hormone) (84443-TSH) TLB-T3, Free (Triiodothyronine) (84481-T3FREE) TLB-T4 (Thyrox), Free [phone]) Specimen Handling (19147)  Labs Reviewed: TSH: 3.14 (05/15/2008)    Chol: 294 (05/15/2008)   HDL: 55.1 (05/15/2008)   LDL: DEL (05/15/2008)   TG: 124 (05/15/2008)  Complete Medication List: 1)  Ambien 10 Mg Tabs (Zolpidem tartrate) .Marland Kitchen.. 1 every 3rd night as needed. 2)  Crestor 10 Mg Tabs (Rosuvastatin calcium) .... As directed 3)  Mobic 15 Mg Tabs (Meloxicam) .... 1/2 -1 by mouth once daily as needed  pain  Patient Instructions: 1)  fasting labs in 3 months---[facility] 272.3     Orders Added: 1)  Venipuncture [36415] 2)  TLB-TSH (Thyroid Stimulating Hormone) [84443-TSH] 3)  TLB-T3, Free (Triiodothyronine) [82956-O1HYQM] 4)  TLB-T4 (Thyrox), Free [57846-NG2X] 5)  Specimen Handling [99000] 6)  Est. Patient Level III [52841]

## 2010-08-05 NOTE — Progress Notes (Signed)
Summary: referral  Phone Note Call from Patient   Caller: Patient Summary of Call: Pt call to report that hip pain is still no better so she would like to be referred to ortho. Pt aware referral put in awaiting appt info..........Marland KitchenFelecia Deloach CMA  July 15, 2010 10:21 AM

## 2010-09-16 ENCOUNTER — Telehealth: Payer: Self-pay | Admitting: Family Medicine

## 2010-09-17 ENCOUNTER — Other Ambulatory Visit (INDEPENDENT_AMBULATORY_CARE_PROVIDER_SITE_OTHER): Payer: Medicare Other

## 2010-09-17 ENCOUNTER — Encounter (INDEPENDENT_AMBULATORY_CARE_PROVIDER_SITE_OTHER): Payer: Self-pay | Admitting: *Deleted

## 2010-09-17 DIAGNOSIS — E783 Hyperchylomicronemia: Secondary | ICD-10-CM

## 2010-09-20 ENCOUNTER — Telehealth (INDEPENDENT_AMBULATORY_CARE_PROVIDER_SITE_OTHER): Payer: Self-pay | Admitting: *Deleted

## 2010-09-21 NOTE — Progress Notes (Signed)
Summary: refiill  Phone Note Refill Request   Refills Requested: Medication #1:  ZOLPIDEM TARYTATE 10MG  TAKE 1 TABLET EVERY 3RD NIGHT AS NEEDED   [facility] [address] [facility] [phone]  Initial call taken by: Okey Regal [place],  September 16, 2010 4:52 PM  Follow-up for Phone Call        last seen 06/15/10 and filled 06/02/10 please advise Follow-up by: Almeta Monas CMA Duncan Dull),  September 16, 2010 5:12 PM  Additional Follow-up for Phone Call Additional follow up Details #1::        refill x1 Additional Follow-up by: Loreen Freud DO,  September 16, 2010 5:27 PM    Prescriptions: AMBIEN 10 MG  TABS (ZOLPIDEM TARTRATE) 1 every 3rd night as needed.  #30 x 0   Entered by:   Almeta Monas CMA (AAMA)   Authorized by:   Loreen Freud DO   Signed by:   Almeta Monas CMA (AAMA) on 09/17/2010   Method used:   Printed then faxed to ...       [facility] [address] [facility] [address] (retail)       [address] [place], [place]  [address]       Ph: [phone]       Fax: [phone]   RxID:   [id number]

## 2010-09-29 ENCOUNTER — Ambulatory Visit: Payer: Medicare Other | Attending: Orthopedic Surgery | Admitting: Physical Therapy

## 2010-09-29 DIAGNOSIS — M2569 Stiffness of other specified joint, not elsewhere classified: Secondary | ICD-10-CM | POA: Insufficient documentation

## 2010-09-29 DIAGNOSIS — IMO0001 Reserved for inherently not codable concepts without codable children: Secondary | ICD-10-CM | POA: Insufficient documentation

## 2010-09-29 DIAGNOSIS — M545 Low back pain, unspecified: Secondary | ICD-10-CM | POA: Insufficient documentation

## 2010-09-29 DIAGNOSIS — M25559 Pain in unspecified hip: Secondary | ICD-10-CM | POA: Insufficient documentation

## 2010-09-30 NOTE — Progress Notes (Signed)
Summary: PT advisement  Phone Note Call from Patient Call back at Home Phone [phone]   Summary of Call: Patient called about referrals (physical therapy) and would like to know if there is a location that she can go to that is close to our office/[employer]. Patient does not want to go to downtown location. She would like advisement from Dr. Laury Axon. Please advise. Initial call taken by: Lucious Groves CMA,  September 20, 2010 3:58 PM  Follow-up for Phone Call        [place] [facility]---did ortho refer her?  She would just need to let them know she wants to go there. Follow-up by: Loreen Freud DO,  September 20, 2010 4:55 PM  Additional Follow-up for Phone Call Additional follow up Details #1::        Left message to call back  Additional Follow-up by: Almeta Monas CMA Duncan Dull),  September 21, 2010 10:07 AM    Additional Follow-up for Phone Call Additional follow up Details #2::    pt aware of the above and stated  and was switched to the [facility] in [place] by Her Ortho..... Follow-up by: Almeta Monas CMA Duncan Dull),  September 21, 2010 10:45 AM

## 2010-10-05 ENCOUNTER — Encounter: Payer: Self-pay | Admitting: Family Medicine

## 2010-10-05 ENCOUNTER — Ambulatory Visit: Payer: Medicare Other | Attending: Orthopedic Surgery | Admitting: Physical Therapy

## 2010-10-05 DIAGNOSIS — M2569 Stiffness of other specified joint, not elsewhere classified: Secondary | ICD-10-CM | POA: Insufficient documentation

## 2010-10-05 DIAGNOSIS — M545 Low back pain, unspecified: Secondary | ICD-10-CM | POA: Insufficient documentation

## 2010-10-05 DIAGNOSIS — M25559 Pain in unspecified hip: Secondary | ICD-10-CM | POA: Insufficient documentation

## 2010-10-05 DIAGNOSIS — IMO0001 Reserved for inherently not codable concepts without codable children: Secondary | ICD-10-CM | POA: Insufficient documentation

## 2010-10-07 ENCOUNTER — Ambulatory Visit: Payer: Medicare Other | Admitting: Physical Therapy

## 2010-10-12 ENCOUNTER — Ambulatory Visit: Payer: Medicare Other | Attending: Orthopedic Surgery | Admitting: Physical Therapy

## 2010-10-14 ENCOUNTER — Ambulatory Visit: Payer: Medicare Other | Attending: Orthopedic Surgery | Admitting: Physical Therapy

## 2010-10-14 DIAGNOSIS — M25559 Pain in unspecified hip: Secondary | ICD-10-CM | POA: Insufficient documentation

## 2010-10-14 DIAGNOSIS — M2569 Stiffness of other specified joint, not elsewhere classified: Secondary | ICD-10-CM | POA: Insufficient documentation

## 2010-10-14 DIAGNOSIS — M545 Low back pain, unspecified: Secondary | ICD-10-CM | POA: Insufficient documentation

## 2010-10-14 DIAGNOSIS — IMO0001 Reserved for inherently not codable concepts without codable children: Secondary | ICD-10-CM | POA: Insufficient documentation

## 2010-10-15 ENCOUNTER — Encounter: Payer: Self-pay | Admitting: Family Medicine

## 2010-10-18 ENCOUNTER — Ambulatory Visit (INDEPENDENT_AMBULATORY_CARE_PROVIDER_SITE_OTHER): Payer: Medicare Other | Admitting: Family Medicine

## 2010-10-18 ENCOUNTER — Encounter: Payer: Self-pay | Admitting: Family Medicine

## 2010-10-18 VITALS — BP 122/80 | HR 84 | Wt 151.8 lb

## 2010-10-18 DIAGNOSIS — E785 Hyperlipidemia, unspecified: Secondary | ICD-10-CM

## 2010-10-18 MED ORDER — PITAVASTATIN CALCIUM 2 MG PO TABS: ORAL_TABLET | ORAL | Status: DC

## 2010-10-18 NOTE — Progress Notes (Signed)
  Subjective:    Patient ID: Joanna Williams, female    DOB: 1940/09/09, 70 y.o.   MRN: [id number]  HPI Pt here to review [facility].  No complaints.  Pt unable to take crestor everyday secondary to myalgias.    Review of Systems  All other systems reviewed and are negative.       Objective:   Physical Exam  Constitutional: She appears well-developed and well-nourished.  Psychiatric: She has a normal mood and affect. Her behavior is normal. Judgment and thought content normal.          Assessment & Plan:

## 2010-10-18 NOTE — Assessment & Plan Note (Signed)
Reviewed [facility] Change crestor to livalo Recheck 3 months

## 2010-10-19 ENCOUNTER — Ambulatory Visit: Payer: Medicare Other | Attending: Orthopedic Surgery | Admitting: Physical Therapy

## 2010-10-19 DIAGNOSIS — M25559 Pain in unspecified hip: Secondary | ICD-10-CM | POA: Insufficient documentation

## 2010-10-19 DIAGNOSIS — M545 Low back pain, unspecified: Secondary | ICD-10-CM | POA: Insufficient documentation

## 2010-10-19 DIAGNOSIS — IMO0001 Reserved for inherently not codable concepts without codable children: Secondary | ICD-10-CM | POA: Insufficient documentation

## 2010-10-19 DIAGNOSIS — M2569 Stiffness of other specified joint, not elsewhere classified: Secondary | ICD-10-CM | POA: Insufficient documentation

## 2010-10-21 ENCOUNTER — Ambulatory Visit: Payer: Medicare Other | Admitting: Physical Therapy

## 2010-10-21 DIAGNOSIS — M2569 Stiffness of other specified joint, not elsewhere classified: Secondary | ICD-10-CM | POA: Insufficient documentation

## 2010-10-21 DIAGNOSIS — M25559 Pain in unspecified hip: Secondary | ICD-10-CM | POA: Insufficient documentation

## 2010-10-21 DIAGNOSIS — M545 Low back pain, unspecified: Secondary | ICD-10-CM | POA: Insufficient documentation

## 2010-10-21 DIAGNOSIS — IMO0001 Reserved for inherently not codable concepts without codable children: Secondary | ICD-10-CM | POA: Insufficient documentation

## 2010-10-26 ENCOUNTER — Ambulatory Visit: Payer: Medicare Other | Admitting: Physical Therapy

## 2010-10-26 DIAGNOSIS — M545 Low back pain, unspecified: Secondary | ICD-10-CM | POA: Insufficient documentation

## 2010-10-26 DIAGNOSIS — IMO0001 Reserved for inherently not codable concepts without codable children: Secondary | ICD-10-CM | POA: Insufficient documentation

## 2010-10-26 DIAGNOSIS — M2569 Stiffness of other specified joint, not elsewhere classified: Secondary | ICD-10-CM | POA: Insufficient documentation

## 2010-10-26 DIAGNOSIS — M25559 Pain in unspecified hip: Secondary | ICD-10-CM | POA: Insufficient documentation

## 2010-10-28 ENCOUNTER — Ambulatory Visit: Payer: Medicare Other | Admitting: Physical Therapy

## 2010-10-29 ENCOUNTER — Ambulatory Visit: Payer: Medicare Other | Admitting: Physical Therapy

## 2010-10-29 DIAGNOSIS — IMO0001 Reserved for inherently not codable concepts without codable children: Secondary | ICD-10-CM | POA: Insufficient documentation

## 2010-10-29 DIAGNOSIS — M25559 Pain in unspecified hip: Secondary | ICD-10-CM | POA: Insufficient documentation

## 2010-10-29 DIAGNOSIS — M2569 Stiffness of other specified joint, not elsewhere classified: Secondary | ICD-10-CM | POA: Insufficient documentation

## 2010-10-29 DIAGNOSIS — M545 Low back pain, unspecified: Secondary | ICD-10-CM | POA: Insufficient documentation

## 2010-11-18 ENCOUNTER — Telehealth: Payer: Self-pay

## 2010-11-18 NOTE — Telephone Encounter (Signed)
Spoke with patient and she is aware of Dr.Lowne recommendations and agreed to it      KP

## 2010-11-18 NOTE — Telephone Encounter (Signed)
otc fish oil or flaxseed oil----4 g a day

## 2010-11-18 NOTE — Telephone Encounter (Signed)
Pt called left msg on triage voicemail that she can't afford Lovaza would like to know if there any other alternative medication.   Dr. Laury Axon, pls advise

## 2010-11-19 NOTE — Letter (Signed)
October 25, 2006    Joanna Perla, DO  [address] [place], [place] [address]   RE:  [name], [name]  MRN:  [id number]  /  DOB:  1941-05-12   Dear Myrene Buddy:   Somehow [name] ended up seeing me to review her NMR lipo profile.  Your name as ordering physician was crossed out, and mine written in.   Rather than have her reschedule at this time, I went over it with her.   She is on no diet.  She is extremely physically active walking 3 miles  almost every day with no cardiopulmonary symptoms.   She was intolerant to 80 mg of Zocor.  She has had no issues with  Crestor 5 mg.   On this relatively small dose, her LDL was 153, which would be  associated with at least a 15% risk of premature cardiovascular event.  Based on the panel, her LDL goal would less than 100.  The good news is it looks like she could get to that level with Crestor,  Vytorin, or Lipitor.  It basically would depend on which of the 3 is  least expensive to her.   One option would be to try the Vytorin 40 mg 1/2 pill daily with fasting  labs in 10-11 weeks & OV 2-3 days later with you.    Sincerely,      Titus Dubin. Alwyn Ren, MD,FACP,FCCP  Electronically Signed    [facility]  DD: 10/25/2006  DT: 10/25/2006  Job #: [id number]

## 2010-12-21 ENCOUNTER — Other Ambulatory Visit: Payer: Self-pay

## 2010-12-21 MED ORDER — ZOLPIDEM TARTRATE 10 MG PO TABS: 10.0000 mg | ORAL_TABLET | Freq: Every evening | ORAL | Status: DC | PRN

## 2010-12-21 NOTE — Telephone Encounter (Signed)
Last seen 10/18/10 and filled 09/17/10   Please advise    KP

## 2010-12-21 NOTE — Telephone Encounter (Signed)
Faxed.   KP 

## 2011-01-14 ENCOUNTER — Other Ambulatory Visit: Payer: Self-pay | Admitting: Family Medicine

## 2011-01-14 DIAGNOSIS — E785 Hyperlipidemia, unspecified: Secondary | ICD-10-CM

## 2011-01-17 ENCOUNTER — Other Ambulatory Visit (INDEPENDENT_AMBULATORY_CARE_PROVIDER_SITE_OTHER): Payer: Medicare Other

## 2011-01-17 DIAGNOSIS — E785 Hyperlipidemia, unspecified: Secondary | ICD-10-CM

## 2011-01-17 LAB — HEPATIC FUNCTION PANEL
Albumin: 4.3 g/dL (ref 3.5–5.2)
Total Protein: 7.1 g/dL (ref 6.0–8.3)

## 2011-01-17 LAB — LIPID PANEL
Cholesterol: 309 mg/dL — ABNORMAL HIGH (ref 0–200)
HDL: 57.1 mg/dL (ref >39.00)
Triglycerides: 185 mg/dL — ABNORMAL HIGH (ref 0.0–149.0)

## 2011-01-17 LAB — BASIC METABOLIC PANEL
BUN: 19 mg/dL (ref 6–23)
CO2: 28 mEq/L (ref 19–32)
Calcium: 8.8 mg/dL (ref 8.4–10.5)
Creatinine, Ser: 0.8 mg/dL (ref 0.4–1.2)
Glucose, Bld: 93 mg/dL (ref 70–99)

## 2011-01-17 NOTE — Progress Notes (Signed)
Labs only

## 2011-01-18 ENCOUNTER — Other Ambulatory Visit: Payer: Self-pay | Admitting: Family Medicine

## 2011-01-18 DIAGNOSIS — Z1231 Encounter for screening mammogram for malignant neoplasm of breast: Secondary | ICD-10-CM

## 2011-01-19 ENCOUNTER — Telehealth: Payer: Self-pay | Admitting: Family Medicine

## 2011-01-19 MED ORDER — SIMVASTATIN 20 MG PO TABS: 20.0000 mg | ORAL_TABLET | Freq: Every evening | ORAL | Status: DC

## 2011-01-19 NOTE — Telephone Encounter (Signed)
Patient aware of Rx ---Meds sent to the pharmacy    KP

## 2011-01-19 NOTE — Telephone Encounter (Signed)
We can start with zocor 20 mg  #30  1 po qhs , 2 refills   ---recheck 3 months  272.4  Lipid, hep

## 2011-01-19 NOTE — Telephone Encounter (Signed)
Message copied by Doristine Devoid on Wed Jan 19, 2011  2:26 PM ------      Message from: Lelon Perla      Created: Mon Jan 17, 2011  8:27 PM       crestor and livalo on med list---if she is taking livalo---increase to 4 mg 1 po qhs  #30  2 refills      Recheck 3 months  272.4  Lipid, hep

## 2011-01-19 NOTE — Telephone Encounter (Signed)
Spoke w/ pt about labs says she hasn't been on anything because medication was too expensive so she called and was told it was ok to take fish oil and flaxseed oil. Pt ok w/ cholesterol medication as long as it is generic says she has taken zocor in the past. Pls advise

## 2011-01-24 NOTE — Progress Notes (Signed)
Labs only

## 2011-02-21 ENCOUNTER — Ambulatory Visit
Admission: RE | Admit: 2011-02-21 | Discharge: 2011-02-21 | Disposition: A | Discharge status: Home / Self Care | Payer: Medicare Other | Source: Ambulatory Visit | Attending: Family Medicine | Admitting: Family Medicine

## 2011-02-21 DIAGNOSIS — Z1231 Encounter for screening mammogram for malignant neoplasm of breast: Secondary | ICD-10-CM

## 2011-03-08 ENCOUNTER — Other Ambulatory Visit: Payer: Self-pay | Admitting: Family Medicine

## 2011-03-09 MED ORDER — ZOLPIDEM TARTRATE 10 MG PO TABS: 10.0000 mg | ORAL_TABLET | Freq: Every evening | ORAL | Status: DC | PRN

## 2011-03-09 NOTE — Telephone Encounter (Signed)
Faxed.   KP 

## 2011-03-09 NOTE — Telephone Encounter (Signed)
Last seen 10/18/10 and filled 12/21/10 please advise      KP

## 2011-04-04 ENCOUNTER — Encounter: Payer: Self-pay | Admitting: Family Medicine

## 2011-04-04 ENCOUNTER — Ambulatory Visit (INDEPENDENT_AMBULATORY_CARE_PROVIDER_SITE_OTHER): Payer: Medicare Other | Admitting: Family Medicine

## 2011-04-04 VITALS — BP 120/74 | HR 82 | Temp 98.5°F | Wt 151.4 lb

## 2011-04-04 DIAGNOSIS — R300 Dysuria: Secondary | ICD-10-CM

## 2011-04-04 DIAGNOSIS — N390 Urinary tract infection, site not specified: Secondary | ICD-10-CM

## 2011-04-04 DIAGNOSIS — E785 Hyperlipidemia, unspecified: Secondary | ICD-10-CM

## 2011-04-04 DIAGNOSIS — N3000 Acute cystitis without hematuria: Secondary | ICD-10-CM

## 2011-04-04 LAB — POCT URINALYSIS DIPSTICK
Nitrite, UA: POSITIVE
Protein, UA: 30
Urobilinogen, UA: 0.2
pH, UA: 6.5

## 2011-04-04 MED ORDER — CIPROFLOXACIN HCL 500 MG PO TABS: 500.0000 mg | ORAL_TABLET | Freq: Two times a day (BID) | ORAL | Status: AC

## 2011-04-04 MED ORDER — PHENAZOPYRIDINE HCL 200 MG PO TABS: 200.0000 mg | ORAL_TABLET | Freq: Three times a day (TID) | ORAL | Status: DC | PRN

## 2011-04-04 NOTE — Progress Notes (Signed)
  Subjective:    Joanna Williams is a 70 y.o. female who complains of burning with urination, frequency and suprapubic pressure. She has had symptoms for 2 days. Patient also complains of stomach ache. Patient denies back pain, congestion, cough, fever, headache, rhinitis, sorethroat and vaginal discharge. Patient does not have a history of recurrent UTI. Patient does not have a history of pyelonephritis.   The following portions of the patient's history were reviewed and updated as appropriate: allergies, current medications, past family history, past medical history, past social history, past surgical history and problem list.  Review of Systems Pertinent items are noted in HPI.    Objective:    BP 120/74  Pulse 82  Temp(Src) 98.5 F (36.9 C) (Oral)  Wt 151 lb 6.4 oz (68.675 kg)  SpO2 96% General appearance: alert, cooperative, appears stated age and no distress Abdomen: soft, non-tender; bowel sounds normal; no masses,  no organomegaly  Laboratory:  Urine dipstick: mod for leukocyte esterase and pos for nitrites.   Micro exam: not done.    Assessment:    Acute cystitis and UTI     Plan:    Medications: ciprofloxacin. Maintain adequate hydration. Follow up if symptoms not improving, and as needed.

## 2011-04-04 NOTE — Patient Instructions (Signed)
Urinary Tract Infection (UTI)   Infections of the urinary tract can start in several places. A bladder infection (cystitis), a kidney infection (pyelonephritis), and a prostate infection (prostatitis) are different types of urinary tract infections. They usually get better if treated with medicines (antibiotics) that kill germs. Take all the medicine until it is gone. You or your child may feel better in a few days, but TAKE ALL MEDICINE or the infection may not respond and may become more difficult to treat.   HOME CARE INSTRUCTIONS   Drink enough water and fluids to keep the urine clear or pale yellow. Cranberry juice is especially recommended, in addition to large amounts of water.   Avoid caffeine, tea, and carbonated beverages. They tend to irritate the bladder.   Alcohol may irritate the prostate.   Only take over-the-counter or prescription medicines for pain, discomfort, or fever as directed by your caregiver.   FINDING OUT THE RESULTS OF YOUR TEST   Not all test results are available during your visit. If your or your child's test results are not back during the visit, make an appointment with your caregiver to find out the results. Do not assume everything is normal if you have not heard from your caregiver or the medical facility. It is important for you to follow up on all test results.   TO PREVENT FURTHER INFECTIONS:   Empty the bladder often. Avoid holding urine for long periods of time.   After a bowel movement, women should cleanse from front to back. Use each tissue only once.   Empty the bladder before and after sexual intercourse.   SEEK MEDICAL CARE IF:   There is back pain.   You or your child has an oral temperature above 100.4.   Your baby is older than 3 months with a rectal temperature of 100.5º F (38.1° C) or higher for more than 1 day.   Your or your child's problems (symptoms) are no better in 3 days. Return sooner if you or your child is getting worse.   SEEK IMMEDIATE MEDICAL CARE IF:    There is severe back pain or lower abdominal pain.   You or your child develops chills.   You or your child has an oral temperature above 100.4, not controlled by medicine.   Your baby is older than 3 months with a rectal temperature of 102º F (38.9º C) or higher.   Your baby is 3 months old or younger with a rectal temperature of 100.4º F (38º C) or higher.   There is nausea or vomiting.   There is continued burning or discomfort with urination.   MAKE SURE YOU:   Understand these instructions.   Will watch this condition.   Will get help right away if you or your child is not doing well or gets worse.   Document Released: 03/30/2005 Document Re-Released: 09/14/2009   ExitCare® Patient Information ©2011 [employer].

## 2011-04-07 LAB — URINE CULTURE

## 2011-04-15 ENCOUNTER — Other Ambulatory Visit: Payer: Self-pay | Admitting: Family Medicine

## 2011-04-15 DIAGNOSIS — E785 Hyperlipidemia, unspecified: Secondary | ICD-10-CM

## 2011-04-18 ENCOUNTER — Other Ambulatory Visit (INDEPENDENT_AMBULATORY_CARE_PROVIDER_SITE_OTHER): Payer: Medicare Other

## 2011-04-18 DIAGNOSIS — E785 Hyperlipidemia, unspecified: Secondary | ICD-10-CM

## 2011-04-18 DIAGNOSIS — Z23 Encounter for immunization: Secondary | ICD-10-CM

## 2011-04-18 LAB — BASIC METABOLIC PANEL
BUN: 16 mg/dL (ref 6–23)
CO2: 27 mEq/L (ref 19–32)
Chloride: 106 mEq/L (ref 96–112)
Creatinine, Ser: 0.7 mg/dL (ref 0.4–1.2)
Glucose, Bld: 83 mg/dL (ref 70–99)

## 2011-04-18 LAB — HEPATIC FUNCTION PANEL
AST: 19 U/L (ref 0–37)
Albumin: 4.1 g/dL (ref 3.5–5.2)
Alkaline Phosphatase: 68 U/L (ref 39–117)
Bilirubin, Direct: 0 mg/dL (ref 0.0–0.3)
Total Bilirubin: 0.4 mg/dL (ref 0.3–1.2)

## 2011-04-18 LAB — POCT URINALYSIS DIPSTICK
Ketones, UA: NEGATIVE
Protein, UA: NEGATIVE
Spec Grav, UA: 1.015

## 2011-04-18 LAB — LIPID PANEL
Total CHOL/HDL Ratio: 4
VLDL: 37.4 mg/dL (ref 0.0–40.0)

## 2011-04-18 NOTE — Progress Notes (Signed)
Labs only

## 2011-04-19 ENCOUNTER — Telehealth: Payer: Self-pay | Admitting: *Deleted

## 2011-04-19 ENCOUNTER — Encounter: Payer: Self-pay | Admitting: *Deleted

## 2011-04-19 MED ORDER — SIMVASTATIN 40 MG PO TABS: 40.0000 mg | ORAL_TABLET | Freq: Every day | ORAL | Status: DC

## 2011-04-19 NOTE — Telephone Encounter (Signed)
Message copied by Verdene Rio on Tue Apr 19, 2011 11:47 AM ------      Message from: Lelon Perla      Created: Mon Apr 18, 2011  4:30 PM       Cholesterol--- LDL goal < 100,  HDL >40  TG < 150.  Diet and exercise will increase HDL and decrease LDL and TG.  Fish,  Fish Oil, Flaxseed oil will also help increase the HDL and decrease Triglycerides.   Increase zocor 40 mg #30 ,  2 refills 1 po qhs.   Recheck labs in 3 months  272.4  Lipid, hep

## 2011-04-20 LAB — URINE CULTURE: Colony Count: 3000

## 2011-05-30 ENCOUNTER — Other Ambulatory Visit: Payer: Self-pay | Admitting: Family Medicine

## 2011-05-31 MED ORDER — ZOLPIDEM TARTRATE 10 MG PO TABS: 10.0000 mg | ORAL_TABLET | Freq: Every evening | ORAL | Status: DC | PRN

## 2011-05-31 NOTE — Telephone Encounter (Signed)
Last seen 04/04/11 and filled 03/09/11 #30. Please advise     KP

## 2011-07-21 ENCOUNTER — Other Ambulatory Visit: Payer: Self-pay | Admitting: Orthopedic Surgery

## 2011-07-26 ENCOUNTER — Encounter (HOSPITAL_COMMUNITY): Payer: Self-pay | Admitting: Respiratory Therapy

## 2011-08-02 NOTE — Pre-Procedure Instructions (Signed)
20 LAQUITA HARLAN   08/02/2011      Your procedure is scheduled on: Wednesday, Feb. 6 th  Report to [facility] at  11:00 AM.  Call this number if you have problems the morning of surgery: [phone]   Remember:   Do not eat food:After Midnight Tuesday.   May have clear liquids: up to 4 Hours before arrival time -- 7:00 AM.  Clear liquids include soda, tea, black coffee, apple or grape juice, broth.   Take these medicines the morning of surgery with A SIP OF WATER:  Norco,              Prilosec   Do not wear jewelry, make-up or nail polish.  Do not wear lotions, powders, or perfumes. You may wear deodorant.  Do not shave 48 hours prior to surgery.   Do not bring valuables to the hospital.   Contacts, dentures or bridgework may not be worn into surgery.  Leave suitcase in the car. After surgery it may be brought to your room.  For patients admitted to the hospital, checkout time is 11:00 AM the day of discharge.   Patients discharged the day of surgery will not be allowed to drive home.  Name and phone number of your driver: NA  Special Instructions: CHG Shower Use Special Wash: 1/2 bottle night before surgery and 1/2 bottle morning of surgery.   Please read over the following fact sheets that you were given: Pain Booklet, Blood Transfusion Information, MRSA Information and Surgical Site Infection Prevention

## 2011-08-03 ENCOUNTER — Encounter (HOSPITAL_COMMUNITY)
Admission: RE | Admit: 2011-08-03 | Discharge: 2011-08-03 | Disposition: A | Discharge status: Home / Self Care | Payer: Medicare Other | Source: Ambulatory Visit | Attending: Orthopedic Surgery | Admitting: Orthopedic Surgery

## 2011-08-03 ENCOUNTER — Encounter (HOSPITAL_COMMUNITY): Payer: Self-pay

## 2011-08-03 LAB — DIFFERENTIAL
Basophils Absolute: 0.1 10*3/uL (ref 0.0–0.1)
Lymphocytes Relative: 41 % (ref 12–46)
Monocytes Absolute: 0.8 10*3/uL (ref 0.1–1.0)
Monocytes Relative: 11 % (ref 3–12)
Neutro Abs: 2.9 10*3/uL (ref 1.7–7.7)

## 2011-08-03 LAB — CBC
HCT: 44.1 % (ref 36.0–46.0)
Hemoglobin: 14.8 g/dL (ref 12.0–15.0)
RBC: 4.7 MIL/uL (ref 3.87–5.11)
RDW: 12.8 % (ref 11.5–15.5)
WBC: 6.7 10*3/uL (ref 4.0–10.5)

## 2011-08-03 LAB — SURGICAL PCR SCREEN
MRSA, PCR: NEGATIVE
Staphylococcus aureus: NEGATIVE

## 2011-08-03 LAB — ABO/RH: ABO/RH(D): B POS

## 2011-08-03 LAB — BASIC METABOLIC PANEL
Chloride: 101 mEq/L (ref 96–112)
GFR calc Af Amer: >90 mL/min (ref >90)
Potassium: 4.1 mEq/L (ref 3.5–5.1)
Sodium: 140 mEq/L (ref 135–145)

## 2011-08-03 LAB — URINALYSIS, ROUTINE W REFLEX MICROSCOPIC
Hgb urine dipstick: NEGATIVE
Protein, ur: NEGATIVE mg/dL
Urobilinogen, UA: 0.2 mg/dL (ref 0.0–1.0)

## 2011-08-03 LAB — APTT: aPTT: 28 seconds (ref 24–37)

## 2011-08-03 MED ORDER — CHLORHEXIDINE GLUCONATE 4 % EX LIQD: 60.0000 mL | Freq: Once | CUTANEOUS | Status: DC

## 2011-08-04 LAB — TYPE AND SCREEN
ABO/RH(D): B POS
Antibody Screen: NEGATIVE

## 2011-08-09 MED ORDER — CEFAZOLIN SODIUM 1-5 GM-% IV SOLN
1.0000 g | INTRAVENOUS | Status: AC
  Administered 2011-08-10: 1 g via INTRAVENOUS
  Filled 2011-08-09: qty 50

## 2011-08-09 NOTE — H&P (Signed)
  HISTORY OF PRESENT ILLNESS:  Joanna Williams is a 71 year old patient who was last seen in February 2012.  X-rays taken by another physician demonstrated moderate arthritis.  She had a cortisone injection with Dr. Regino Schultze on 07/22/2010 that provided her with partial pain relief.  She reports that recently her right hip pain has worsened significantly, she localizes most of it to the groin area.  She has had Mobic and Vicodin in the past, but reports that the medicines are not working very well at this point.  She does water aerobics, but has difficulty being active outside of the pool.  PAST MEDICAL HISTORY:  Significant for high cholesterol.  She takes Crestor and has no known drug allergies.  She has not listed any prior surgeries.  FAMILY HISTORY:  Negative for diabetes or hypertension.  SOCIAL HISTORY:  She denies use of tobacco and drinks occasional alcohol.  She is married and retired.  ROS: Patient denies dizziness, nausea, fever, chills, vomiting, shortness of breath, chest pain, loss of appetite, or rash.    PHYSICAL EXAM: Well-developed, well-nourished.  Awake, alert, and oriented x3.  Extraocular motion is intact.  No use of accessory respiratory muscles for breathing.   Cardiovascular exam reveals a regular rhythm.  Skin is intact without cuts, scrapes, or abrasions.  Right hip pain with internal and external rotation.    RADIOGRAPHS:  X-rays were ordered, performed, and interpreted by me today.  Two views of the right hip today demonstrate bone-on-bone degenerative joint disease in the superior weightbearing dome of the hip.  IMPRESSION:  End stage arthritis of the right hip.  PLAN:  We discussed the options in detail with Joanna Williams today.  She understands that at this point she is a candidate for a hip replacement.  Dr. Turner Daniels discussed the surgery with her today.  If she decides to proceed, we would use a DePuy pinnacle poly on ceramic cup and an S-ROM stem.  We have refilled her  meloxicam and Norco prescriptions today.

## 2011-08-10 ENCOUNTER — Encounter (HOSPITAL_COMMUNITY): Payer: Self-pay | Admitting: *Deleted

## 2011-08-10 ENCOUNTER — Inpatient Hospital Stay (HOSPITAL_COMMUNITY)
Admission: RE | Admit: 2011-08-10 | Discharge: 2011-08-13 | DRG: 470 | Disposition: A | Discharge status: Home Health | Payer: Medicare Other | Source: Ambulatory Visit | Attending: Orthopedic Surgery | Admitting: Orthopedic Surgery

## 2011-08-10 ENCOUNTER — Encounter (HOSPITAL_COMMUNITY): Payer: Self-pay | Admitting: Anesthesiology

## 2011-08-10 ENCOUNTER — Encounter (HOSPITAL_COMMUNITY)
Admission: RE | Disposition: A | Discharge status: Home Health | Payer: Self-pay | Source: Ambulatory Visit | Attending: Orthopedic Surgery

## 2011-08-10 ENCOUNTER — Ambulatory Visit (HOSPITAL_COMMUNITY): Payer: Medicare Other | Admitting: Anesthesiology

## 2011-08-10 ENCOUNTER — Ambulatory Visit (HOSPITAL_COMMUNITY): Payer: Medicare Other

## 2011-08-10 DIAGNOSIS — M169 Osteoarthritis of hip, unspecified: Principal | ICD-10-CM | POA: Diagnosis present

## 2011-08-10 DIAGNOSIS — M1610 Unilateral primary osteoarthritis, unspecified hip: Principal | ICD-10-CM | POA: Diagnosis present

## 2011-08-10 DIAGNOSIS — E039 Hypothyroidism, unspecified: Secondary | ICD-10-CM | POA: Diagnosis present

## 2011-08-10 DIAGNOSIS — Z888 Allergy status to other drugs, medicaments and biological substances status: Secondary | ICD-10-CM

## 2011-08-10 DIAGNOSIS — Z01812 Encounter for preprocedural laboratory examination: Secondary | ICD-10-CM

## 2011-08-10 DIAGNOSIS — K219 Gastro-esophageal reflux disease without esophagitis: Secondary | ICD-10-CM | POA: Diagnosis present

## 2011-08-10 DIAGNOSIS — Z87891 Personal history of nicotine dependence: Secondary | ICD-10-CM

## 2011-08-10 DIAGNOSIS — Z87440 Personal history of urinary (tract) infections: Secondary | ICD-10-CM

## 2011-08-10 DIAGNOSIS — E785 Hyperlipidemia, unspecified: Secondary | ICD-10-CM | POA: Diagnosis present

## 2011-08-10 DIAGNOSIS — M25559 Pain in unspecified hip: Secondary | ICD-10-CM

## 2011-08-10 DIAGNOSIS — Z7901 Long term (current) use of anticoagulants: Secondary | ICD-10-CM

## 2011-08-10 HISTORY — PX: TOTAL HIP ARTHROPLASTY: SHX124

## 2011-08-10 SURGERY — ARTHROPLASTY, HIP, TOTAL,POSTERIOR APPROACH
Anesthesia: General | Site: Hip | Laterality: Right | Wound class: Clean

## 2011-08-10 MED ORDER — SIMVASTATIN 40 MG PO TABS
40.0000 mg | ORAL_TABLET | Freq: Every evening | ORAL | Status: DC
  Administered 2011-08-10 – 2011-08-12 (×3): 40 mg via ORAL
  Filled 2011-08-10 (×4): qty 1

## 2011-08-10 MED ORDER — ONDANSETRON HCL 4 MG/2ML IJ SOLN: 4.0000 mg | Freq: Four times a day (QID) | INTRAMUSCULAR | Status: DC | PRN

## 2011-08-10 MED ORDER — METHOCARBAMOL 100 MG/ML IJ SOLN: 500.0000 mg | Freq: Four times a day (QID) | INTRAVENOUS | Status: DC | PRN

## 2011-08-10 MED ORDER — LIDOCAINE HCL (CARDIAC) 20 MG/ML IV SOLN
INTRAVENOUS | Status: DC | PRN
  Administered 2011-08-10: 50 mg via INTRAVENOUS

## 2011-08-10 MED ORDER — LACTATED RINGERS IV SOLN
INTRAVENOUS | Status: DC
  Administered 2011-08-10: 11:00:00 via INTRAVENOUS

## 2011-08-10 MED ORDER — ONDANSETRON HCL 4 MG PO TABS: 4.0000 mg | ORAL_TABLET | Freq: Four times a day (QID) | ORAL | Status: DC | PRN

## 2011-08-10 MED ORDER — PANTOPRAZOLE SODIUM 40 MG PO TBEC
40.0000 mg | DELAYED_RELEASE_TABLET | Freq: Every day | ORAL | Status: DC
  Administered 2011-08-10 – 2011-08-12 (×3): 40 mg via ORAL
  Filled 2011-08-10 (×3): qty 1

## 2011-08-10 MED ORDER — NEOSTIGMINE METHYLSULFATE 1 MG/ML IJ SOLN
INTRAMUSCULAR | Status: DC | PRN
  Administered 2011-08-10: 4 mg via INTRAVENOUS

## 2011-08-10 MED ORDER — BUPIVACAINE-EPINEPHRINE 0.5% -1:200000 IJ SOLN
INTRAMUSCULAR | Status: DC | PRN
  Administered 2011-08-10: 20 mL

## 2011-08-10 MED ORDER — DROPERIDOL 2.5 MG/ML IJ SOLN: 0.6250 mg | INTRAMUSCULAR | Status: DC | PRN

## 2011-08-10 MED ORDER — LACTATED RINGERS IV SOLN
INTRAVENOUS | Status: DC | PRN
  Administered 2011-08-10 (×2): via INTRAVENOUS

## 2011-08-10 MED ORDER — METHOCARBAMOL 100 MG/ML IJ SOLN
500.0000 mg | INTRAVENOUS | Status: AC
  Administered 2011-08-10: 500 mg via INTRAVENOUS
  Filled 2011-08-10: qty 5

## 2011-08-10 MED ORDER — MAGNESIUM HYDROXIDE 400 MG/5ML PO SUSP: 30.0000 mL | Freq: Every day | ORAL | Status: DC | PRN

## 2011-08-10 MED ORDER — HYDROCODONE-ACETAMINOPHEN 5-325 MG PO TABS
1.0000 | ORAL_TABLET | ORAL | Status: DC | PRN
  Administered 2011-08-10 (×2): 1 via ORAL
  Administered 2011-08-11 – 2011-08-12 (×6): 2 via ORAL
  Administered 2011-08-13: 1 via ORAL
  Filled 2011-08-10 (×5): qty 2
  Filled 2011-08-10: qty 1
  Filled 2011-08-10: qty 2
  Filled 2011-08-10 (×2): qty 1

## 2011-08-10 MED ORDER — PHENOL 1.4 % MT LIQD: 1.0000 | OROMUCOSAL | Status: DC | PRN

## 2011-08-10 MED ORDER — KCL IN DEXTROSE-NACL 20-5-0.45 MEQ/L-%-% IV SOLN
INTRAVENOUS | Status: DC
  Administered 2011-08-10 – 2011-08-11 (×2): via INTRAVENOUS
  Filled 2011-08-10 (×9): qty 1000

## 2011-08-10 MED ORDER — DEXTROSE-NACL 5-0.45 % IV SOLN: INTRAVENOUS | Status: DC

## 2011-08-10 MED ORDER — DEXAMETHASONE SODIUM PHOSPHATE 10 MG/ML IJ SOLN
INTRAMUSCULAR | Status: DC | PRN
  Administered 2011-08-10: 4 mg via INTRAVENOUS

## 2011-08-10 MED ORDER — HYDROMORPHONE HCL PF 1 MG/ML IJ SOLN
0.5000 mg | INTRAMUSCULAR | Status: DC | PRN
  Administered 2011-08-10: 1 mg via INTRAVENOUS

## 2011-08-10 MED ORDER — HYDROMORPHONE HCL PF 1 MG/ML IJ SOLN
0.2500 mg | INTRAMUSCULAR | Status: DC | PRN
  Administered 2011-08-10 (×4): 0.5 mg via INTRAVENOUS

## 2011-08-10 MED ORDER — MENTHOL 3 MG MT LOZG
1.0000 | LOZENGE | OROMUCOSAL | Status: DC | PRN
  Filled 2011-08-10: qty 9

## 2011-08-10 MED ORDER — BISACODYL 10 MG RE SUPP: 10.0000 mg | Freq: Every day | RECTAL | Status: DC | PRN

## 2011-08-10 MED ORDER — ONDANSETRON HCL 4 MG/2ML IJ SOLN
INTRAMUSCULAR | Status: DC | PRN
  Administered 2011-08-10: 4 mg via INTRAVENOUS

## 2011-08-10 MED ORDER — METOCLOPRAMIDE HCL 10 MG PO TABS: 5.0000 mg | ORAL_TABLET | Freq: Three times a day (TID) | ORAL | Status: DC | PRN

## 2011-08-10 MED ORDER — FLEET ENEMA 7-19 GM/118ML RE ENEM: 1.0000 | ENEMA | Freq: Once | RECTAL | Status: AC | PRN

## 2011-08-10 MED ORDER — OMEPRAZOLE MAGNESIUM 20 MG PO TBEC: 20.0000 mg | DELAYED_RELEASE_TABLET | Freq: Every day | ORAL | Status: DC

## 2011-08-10 MED ORDER — GLYCOPYRROLATE 0.2 MG/ML IJ SOLN
INTRAMUSCULAR | Status: DC | PRN
  Administered 2011-08-10: .5 mg via INTRAVENOUS

## 2011-08-10 MED ORDER — FENTANYL CITRATE 0.05 MG/ML IJ SOLN
INTRAMUSCULAR | Status: DC | PRN
  Administered 2011-08-10 (×2): 50 ug via INTRAVENOUS
  Administered 2011-08-10: 100 ug via INTRAVENOUS
  Administered 2011-08-10: 50 ug via INTRAVENOUS

## 2011-08-10 MED ORDER — METHOCARBAMOL 500 MG PO TABS
500.0000 mg | ORAL_TABLET | Freq: Four times a day (QID) | ORAL | Status: DC | PRN
  Administered 2011-08-10: 500 mg via ORAL
  Filled 2011-08-10: qty 1

## 2011-08-10 MED ORDER — ZOLPIDEM TARTRATE 10 MG PO TABS: 10.0000 mg | ORAL_TABLET | Freq: Every evening | ORAL | Status: DC | PRN

## 2011-08-10 MED ORDER — ZOLPIDEM TARTRATE 5 MG PO TABS
5.0000 mg | ORAL_TABLET | Freq: Every evening | ORAL | Status: DC | PRN
  Administered 2011-08-12: 5 mg via ORAL
  Filled 2011-08-10: qty 1

## 2011-08-10 MED ORDER — ENOXAPARIN SODIUM 40 MG/0.4ML ~~LOC~~ SOLN
40.0000 mg | SUBCUTANEOUS | Status: DC
  Administered 2011-08-11 – 2011-08-12 (×2): 40 mg via SUBCUTANEOUS
  Filled 2011-08-10 (×4): qty 0.4

## 2011-08-10 MED ORDER — DIPHENHYDRAMINE HCL 12.5 MG/5ML PO ELIX
12.5000 mg | ORAL_SOLUTION | ORAL | Status: DC | PRN
  Filled 2011-08-10: qty 10

## 2011-08-10 MED ORDER — WARFARIN VIDEO
Freq: Once | Status: AC
  Administered 2011-08-12: 18:00:00

## 2011-08-10 MED ORDER — ACETAMINOPHEN 650 MG RE SUPP: 650.0000 mg | Freq: Four times a day (QID) | RECTAL | Status: DC | PRN

## 2011-08-10 MED ORDER — METOCLOPRAMIDE HCL 5 MG/ML IJ SOLN
5.0000 mg | Freq: Three times a day (TID) | INTRAMUSCULAR | Status: DC | PRN
  Filled 2011-08-10: qty 2

## 2011-08-10 MED ORDER — HYDROMORPHONE HCL PF 1 MG/ML IJ SOLN
INTRAMUSCULAR | Status: AC
  Administered 2011-08-10: 1 mg via INTRAVENOUS
  Filled 2011-08-10: qty 1

## 2011-08-10 MED ORDER — WARFARIN SODIUM 5 MG PO TABS
5.0000 mg | ORAL_TABLET | Freq: Once | ORAL | Status: AC
  Administered 2011-08-10: 5 mg via ORAL
  Filled 2011-08-10: qty 1

## 2011-08-10 MED ORDER — ROCURONIUM BROMIDE 100 MG/10ML IV SOLN
INTRAVENOUS | Status: DC | PRN
  Administered 2011-08-10: 40 mg via INTRAVENOUS

## 2011-08-10 MED ORDER — HYDROMORPHONE HCL PF 1 MG/ML IJ SOLN
INTRAMUSCULAR | Status: AC
  Filled 2011-08-10: qty 1

## 2011-08-10 MED ORDER — ACETAMINOPHEN 10 MG/ML IV SOLN
INTRAVENOUS | Status: AC
  Filled 2011-08-10: qty 100

## 2011-08-10 MED ORDER — OXYCODONE HCL 5 MG PO TABS
5.0000 mg | ORAL_TABLET | ORAL | Status: DC | PRN
  Administered 2011-08-11: 10 mg via ORAL
  Administered 2011-08-11: 5 mg via ORAL
  Filled 2011-08-10: qty 2
  Filled 2011-08-10: qty 1

## 2011-08-10 MED ORDER — ALUM & MAG HYDROXIDE-SIMETH 200-200-20 MG/5ML PO SUSP: 30.0000 mL | ORAL | Status: DC | PRN

## 2011-08-10 MED ORDER — PATIENT'S GUIDE TO USING COUMADIN BOOK
Freq: Once | Status: AC
  Administered 2011-08-10: 18:00:00
  Filled 2011-08-10: qty 1

## 2011-08-10 MED ORDER — ACETAMINOPHEN 10 MG/ML IV SOLN
INTRAVENOUS | Status: DC | PRN
  Administered 2011-08-10: 1000 mg via INTRAVENOUS

## 2011-08-10 MED ORDER — SODIUM CHLORIDE 0.9 % IR SOLN
Status: DC | PRN
  Administered 2011-08-10: 1000 mL

## 2011-08-10 MED ORDER — PROPOFOL 10 MG/ML IV EMUL
INTRAVENOUS | Status: DC | PRN
  Administered 2011-08-10: 150 mg via INTRAVENOUS

## 2011-08-10 MED ORDER — ACETAMINOPHEN 325 MG PO TABS: 650.0000 mg | ORAL_TABLET | Freq: Four times a day (QID) | ORAL | Status: DC | PRN

## 2011-08-10 MED ORDER — MIDAZOLAM HCL 5 MG/5ML IJ SOLN
INTRAMUSCULAR | Status: DC | PRN
  Administered 2011-08-10: 2 mg via INTRAVENOUS

## 2011-08-10 SURGICAL SUPPLY — 54 items
BLADE SAW SAG 73X25 THK (BLADE) ×1
BLADE SAW SGTL 18X1.27X75 (BLADE) IMPLANT
BLADE SAW SGTL 73X25 THK (BLADE) ×1 IMPLANT
BLADE SAW SGTL MED 73X18.5 STR (BLADE) IMPLANT
BRUSH FEMORAL CANAL (MISCELLANEOUS) IMPLANT
CLOTH BEACON ORANGE TIMEOUT ST (SAFETY) ×2 IMPLANT
COVER BACK TABLE 24X17X13 BIG (DRAPES) IMPLANT
COVER SURGICAL LIGHT HANDLE (MISCELLANEOUS) ×4 IMPLANT
DRAPE ORTHO SPLIT 77X108 STRL (DRAPES) ×2
DRAPE PROXIMA HALF (DRAPES) ×2 IMPLANT
DRAPE SURG ORHT 6 SPLT 77X108 (DRAPES) ×1 IMPLANT
DRAPE U-SHAPE 47X51 STRL (DRAPES) ×2 IMPLANT
DRILL BIT 7/64X5 (BIT) ×2 IMPLANT
DRSG MEPILEX BORDER 4X12 (GAUZE/BANDAGES/DRESSINGS) ×2 IMPLANT
DRSG MEPILEX BORDER 4X8 (GAUZE/BANDAGES/DRESSINGS) ×2 IMPLANT
DURAPREP 26ML APPLICATOR (WOUND CARE) ×2 IMPLANT
ELECT BLADE 4.0 EZ CLEAN MEGAD (MISCELLANEOUS)
ELECT REM PT RETURN 9FT ADLT (ELECTROSURGICAL) ×2
ELECTRODE BLDE 4.0 EZ CLN MEGD (MISCELLANEOUS) IMPLANT
ELECTRODE REM PT RTRN 9FT ADLT (ELECTROSURGICAL) ×1 IMPLANT
FLOSEAL 10ML (HEMOSTASIS) IMPLANT
GAUZE XEROFORM 1X8 LF (GAUZE/BANDAGES/DRESSINGS) ×2 IMPLANT
GLOVE BIO SURGEON STRL SZ7 (GLOVE) ×2 IMPLANT
GLOVE BIO SURGEON STRL SZ7.5 (GLOVE) ×2 IMPLANT
GLOVE BIOGEL PI IND STRL 7.0 (GLOVE) ×1 IMPLANT
GLOVE BIOGEL PI IND STRL 8 (GLOVE) ×1 IMPLANT
GLOVE BIOGEL PI INDICATOR 7.0 (GLOVE) ×1
GLOVE BIOGEL PI INDICATOR 8 (GLOVE) ×1
GOWN PREVENTION PLUS XLARGE (GOWN DISPOSABLE) ×2 IMPLANT
GOWN STRL NON-REIN LRG LVL3 (GOWN DISPOSABLE) ×4 IMPLANT
HANDPIECE INTERPULSE COAX TIP (DISPOSABLE)
HOOD PEEL AWAY FACE SHEILD DIS (HOOD) ×4 IMPLANT
KIT BASIN OR (CUSTOM PROCEDURE TRAY) ×2 IMPLANT
KIT ROOM TURNOVER OR (KITS) ×2 IMPLANT
MANIFOLD NEPTUNE II (INSTRUMENTS) ×2 IMPLANT
NEEDLE 22X1 1/2 (OR ONLY) (NEEDLE) ×2 IMPLANT
NS IRRIG 1000ML POUR BTL (IV SOLUTION) ×2 IMPLANT
PACK TOTAL JOINT (CUSTOM PROCEDURE TRAY) ×2 IMPLANT
PAD ARMBOARD 7.5X6 YLW CONV (MISCELLANEOUS) ×4 IMPLANT
PASSER SUT SWANSON 36MM LOOP (INSTRUMENTS) ×2 IMPLANT
PRESSURIZER FEMORAL UNIV (MISCELLANEOUS) IMPLANT
SET HNDPC FAN SPRY TIP SCT (DISPOSABLE) IMPLANT
SUT ETHIBOND 2 V 37 (SUTURE) ×2 IMPLANT
SUT ETHILON 3 0 FSL (SUTURE) ×2 IMPLANT
SUT VIC AB 0 CTB1 27 (SUTURE) ×2 IMPLANT
SUT VIC AB 1 CTX 36 (SUTURE) ×2
SUT VIC AB 1 CTX36XBRD ANBCTR (SUTURE) ×1 IMPLANT
SUT VIC AB 2-0 CTB1 (SUTURE) ×2 IMPLANT
SYR CONTROL 10ML LL (SYRINGE) ×2 IMPLANT
TOWEL OR 17X24 6PK STRL BLUE (TOWEL DISPOSABLE) ×2 IMPLANT
TOWEL OR 17X26 10 PK STRL BLUE (TOWEL DISPOSABLE) ×2 IMPLANT
TOWER CARTRIDGE SMART MIX (DISPOSABLE) IMPLANT
TRAY FOLEY CATH 14FR (SET/KITS/TRAYS/PACK) IMPLANT
WATER STERILE IRR 1000ML POUR (IV SOLUTION) ×8 IMPLANT

## 2011-08-10 NOTE — Transfer of Care (Signed)
Immediate Anesthesia Transfer of Care Note  Patient: Joanna Williams  Procedure(s) Performed:  TOTAL HIP ARTHROPLASTY - DEPUY/ PENNACLE POLY OR CERAMIC  Patient Location: PACU  Anesthesia Type: General  Level of Consciousness: alert   Airway & Oxygen Therapy: Patient Spontanous Breathing and Patient connected to nasal cannula oxygen  Post-op Assessment: Report given to PACU RN and Post -op Vital signs reviewed and stable  Post vital signs: stable  Complications: No apparent anesthesia complications

## 2011-08-10 NOTE — Progress Notes (Signed)
ANTICOAGULATION CONSULT NOTE - Initial Consult  Pharmacy Consult for Coumadin Indication: VTE prophylaxis s/p R THA 08/10/11  Allergies  Allergen Reactions  . Niacin     REACTION: Nausea    Patient Measurements: Height: 5\' 4"  (162.6 cm) Weight: 151 lb 8 oz (68.72 kg) IBW/kg (Calculated) : 54.7   Vital Signs: Temp: 97.8 F (36.6 C) (02/06 1500) Temp src: Oral (02/06 1003) BP: 112/63 mmHg (02/06 1501) Pulse Rate: 54  (02/06 1501)  Labs: 1/31 pre-op labs, PT = 13.3 seconds; INR 0.99    H/H = 14.8/44.1, PLTC =  282K    SCr = 0.67; no pre-op LFTs, but LFTs okay Oct. 2012   Estimated Creatinine Clearance: 61.4 ml/min (by C-G formula based on Cr of 0.67).  Medical History: Past Medical History  Diagnosis Date  . Hyperlipidemia    Medications prior to admission, continued post-op:   Omeprazole 20 mg daily (substituting Protonix 40 mg daily)   Simvastatin 40 mg daily    meds NOT continued:        Meloxicam 15 mg daily - OK, not recently taking  Assessment:   OR day - Right total hip arthroplasty  Goal of Therapy:    INR 2-3   Plan:     Coumadin 5 mg tonight.    Coumadin education materials.    Daily PT/INR.    Lovenox 40 mg SQ q24h also to begin 12-18 hours post-op & continue until INR > or = 1.8.  SCDs also ordered.  Dennie Fetters, [place] Pager: [phone] 08/10/2011,4:17 PM

## 2011-08-10 NOTE — Preoperative (Signed)
Beta Blockers   Reason not to administer Beta Blockers:Not Applicable 

## 2011-08-10 NOTE — Anesthesia Preprocedure Evaluation (Addendum)
Anesthesia Evaluation  Patient identified by MRN, date of birth, ID band Patient awake    Reviewed: Allergy & Precautions, H&P , NPO status , Patient's Chart, lab work & pertinent test results, reviewed documented beta blocker date and time   History of Anesthesia Complications Negative for: history of anesthetic complications  Airway Mallampati: II TM Distance: >3 FB Neck ROM: Full    Dental  (+) Teeth Intact and Dental Advisory Given   Pulmonary neg pulmonary ROS, former smoker (1/2 ppd x 20 years; quit in the 1980's) clear to auscultation  Pulmonary exam normal       Cardiovascular Exercise Tolerance: Good neg cardio ROS Regular Normal- Systolic murmurs    Neuro/Psych Negative Neurological ROS  Negative Psych ROS   GI/Hepatic negative GI ROS, Neg liver ROS, GERD-  Medicated and Controlled,  Endo/Other    Renal/GU      Musculoskeletal   Abdominal   Peds  Hematology negative hematology ROS (+)   Anesthesia Other Findings   Reproductive/Obstetrics                          Anesthesia Physical Anesthesia Plan  ASA: II  Anesthesia Plan: General   Post-op Pain Management:    Induction: Intravenous  Airway Management Planned: Oral ETT  Additional Equipment:   Intra-op Plan:   Post-operative Plan: Extubation in OR  Informed Consent: I have reviewed the patients History and Physical, chart, labs and discussed the procedure including the risks, benefits and alternatives for the proposed anesthesia with the patient or authorized representative who has indicated his/her understanding and acceptance.   Dental advisory given  Plan Discussed with: CRNA, Anesthesiologist and Surgeon  Anesthesia Plan Comments:         Anesthesia Quick Evaluation

## 2011-08-10 NOTE — Op Note (Signed)
OPERATIVE REPORT    DATE OF PROCEDURE:  08/10/2011       PREOPERATIVE DIAGNOSIS:  DEGENERATIVE JOINT DISEASE RIGHT HIP                                                          POSTOPERATIVE DIAGNOSIS:  DEGENERATIVE JOINT DISEASE RIGHT HIP                                                           PROCEDURE:  R total hip arthroplasty using a 52 mm DePuy Pinnacle  Cup, [employer], 10-degree polyethylene liner index superior  and posterior, a +3 36 mm ceramic head, a [id number] SROM stem, 18Fsm Cone   SURGEON: Eliah Marquard J    ASSISTANT:   Mauricia Area, PA-C  (present throughout entire procedure and necessary for timely completion of the procedure)   ANESTHESIA: General BLOOD LOSS: 400 FLUID REPLACEMENT: 1500 crystalloid DRAINS: Foley Catheter URINE OUTPUT: 300 COMPLICATIONS: none    INDICATIONS FOR PROCEDURE: A 71 y.o. year-old With  DEGENERATIVE JOINT DISEASE RIGHT HIP   for 3 years, x-rays show bone-on-bone arthritic changes. Despite conservative measures with observation, anti-inflammatory medicine, narcotics, use of a cane, has severe unremitting pain and can ambulate only a few blocks before resting.  Patient desires elective R total hip arthroplasty to decrease pain and increase function. The risks, benefits, and alternatives were discussed at length including but not limited to the risks of infection, bleeding, nerve injury, stiffness, blood clots, the need for revision surgery, cardiopulmonary complications, among others, and they were willing to proceed.y have been discussed. Questions answered.     PROCEDURE IN DETAIL: The patient was identified by armband,  received preoperative IV antibiotics in the holding area at [facility], taken to the operating room , appropriate anesthetic monitors  were attached and general endotracheal anesthesia induced. Foley catheter was inserted. He was rolled into the L lateral decubitus position and fixed there with a  Stulberg Mark II pelvic clamp and the R lower extremity was then prepped and draped  in the usual sterile fashion from the ankle to the hemipelvis. A time-out  procedure was performed. The skin along the lateral hip and thigh  infiltrated with 10 mL of 0.5% Marcaine and epinephrine solution. We  then made a posterolateral approach to the hip. With a #10 blade, 15 cm  incision through skin and subcutaneous tissue down to the level of the  IT band. Small bleeders were identified and cauterized. IT band cut in  line with skin incision exposing the greater trochanter. A Cobra retractor was placed between the gluteus minimus and the superior hip joint capsule, and a spiked Cobra between the quadratus femoris and the inferior hip joint capsule. This isolated the short  external rotators and piriformis tendons. These were tagged with a #2 Ethibond  suture and cut off their insertion on the intertrochanteric crest. The posterior  capsule was then developed into an acetabular-based flap from Posterior Superior off of the acetabulum out over the femoral neck and back posterior inferior to the acetabular rim. This flap was tagged with two #  2 Ethibond sutures and retracted protecting the sciatic nerve. This exposed the arthritic femoral head and osteophytes. The hip was then flexed and internally rotated, dislocating the femoral head and a standard neck cut performed 1 fingerbreadth above the lesser trochanter.  A spiked Cobra was placed in the cotyloid notch and a Hohmann retractor was then used to lever the femur anteriorly off of the anterior pelvic column. A posterior-inferior wing retractor was placed at the junction of the acetabulum and the ischium completing the acetabular exposure.We then removed the peripheral osteophytes and labrum from the acetabulum. We then reamed the acetabulum up to 51 mm with basket reamers obtaining good coverage in all quadrants, irrigated out with normal  saline solution and  hammered into place a 52 mm pinnacle cup in 45  degrees of abduction and about 20 degrees of anteversion. More  peripheral osteophytes removed and a trial 10-degree liner placed with the  index superior-posterior. The hip was then flexed and internally rotated exposing the  proximal femur, which was entered with the initiating reamer followed by  the axial reamers up to a 13.5 mm full depth and 14mm partial depth. We then conically reamed to 48F to the correct depth for a 42 base neck. The calcar was milled to 48Fsm. A trial cone and stem was inserted in the 25 degrees anteversion, with a +0 36mm trial head. Trial reduction was then performed and excellent stability was noted with at 90 of flexion with 70deg of internal rotation and then full extension with maximal external rotation. The hip could not be dislocated in full extension. The knee could easily flex  to about 130 degrees. We also stretched the abductors at this point,  because of the preexisting adductor contractures. All trial components  were then removed. The acetabulum was irrigated out with normal saline  solution. A titanium Apex [facility] was then screwed into place  followed by a 10-degree polyethylene liner index superior-posterior. On  the femoral side a 48Fsm ZTT1 cone was hammered into place, followed by a [id number] SROM stem in 25 degrees of anteversion. At this point, a +0 36 mm ceramic head was  hammered on the stem. The hip was reduced. We checked our stability  one more time and found to be excellent. The wound was once again  thoroughly irrigated out with normal saline solution pulse lavage. The  capsular flap and short external rotators were repaired back to the  intertrochanteric crest through drill holes with a #2 Ethibond suture.  The IT band was closed with running 1 Vicryl suture. The subcutaneous  tissue with 0 and 2-0 undyed Vicryl suture and the skin with running  interlocking 3-0 nylon suture.  Dressing of Xeroform and Mepilex was  then applied. The patient was then unclamped, rolled supine, awaken extubated and taken to recovery room without difficulty in stable condition.   Nasario Czerniak J 08/10/2011, 1:05 PM

## 2011-08-10 NOTE — Interval H&P Note (Signed)
History and Physical Interval Note:  08/10/2011 11:44 AM  Joanna Williams  has presented today for surgery, with the diagnosis of DEGENERATIVE JOINT DISEASE RIGHT HIP  The various methods of treatment have been discussed with the patient and family. After consideration of risks, benefits and other options for treatment, the patient has consented to  Procedure(s): TOTAL HIP ARTHROPLASTY as a surgical intervention .  The patients' history has been reviewed, patient examined, no change in status, stable for surgery.  I have reviewed the patients' chart and labs.  Questions were answered to the patient's satisfaction.     Nestor Lewandowsky

## 2011-08-10 NOTE — Anesthesia Procedure Notes (Signed)
Procedure Name: Intubation Date/Time: 08/10/2011 11:49 AM Performed by: Romie Minus Pre-anesthesia Checklist: Patient identified, Emergency Drugs available, Suction available and Patient being monitored Patient Re-evaluated:Patient Re-evaluated prior to inductionOxygen Delivery Method: Circle System Utilized Preoxygenation: Pre-oxygenation with 100% oxygen Intubation Type: IV induction Ventilation: Mask ventilation without difficulty and Oral airway inserted - appropriate to patient size Laryngoscope Size: 2 Grade View: Grade I Tube type: Oral Tube size: 7.5 mm Number of attempts: 1 Placement Confirmation: ETT inserted through vocal cords under direct vision,  positive ETCO2 and breath sounds checked- equal and bilateral Secured at: 21 cm Tube secured with: Tape Dental Injury: Teeth and Oropharynx as per pre-operative assessment

## 2011-08-10 NOTE — Anesthesia Postprocedure Evaluation (Signed)
Anesthesia Post Note  Patient: Joanna Williams  Procedure(s) Performed:  TOTAL HIP ARTHROPLASTY - DEPUY/ PENNACLE POLY OR CERAMIC  Anesthesia type: general  Patient location: PACU  Post pain: Pain level controlled  Post assessment: Patient's Cardiovascular Status Stable  Last Vitals:  Filed Vitals:   08/10/11 1501  BP: 112/63  Pulse: 54  Temp:   Resp: 16    Post vital signs: Reviewed and stable  Level of consciousness: sedated  Complications: No apparent anesthesia complications

## 2011-08-10 NOTE — Progress Notes (Signed)
Dr. Krista Blue notified of patient drinking a cup of coffee at 0630 (no cream or sugar).

## 2011-08-11 LAB — PROTIME-INR
INR: 1.14 (ref 0.00–1.49)
Prothrombin Time: 14.8 seconds (ref 11.6–15.2)

## 2011-08-11 LAB — BASIC METABOLIC PANEL
BUN: 8 mg/dL (ref 6–23)
Chloride: 104 mEq/L (ref 96–112)
GFR calc non Af Amer: 88 mL/min — ABNORMAL LOW (ref >90)
Glucose, Bld: 120 mg/dL — ABNORMAL HIGH (ref 70–99)
Potassium: 4.1 mEq/L (ref 3.5–5.1)
Sodium: 137 mEq/L (ref 135–145)

## 2011-08-11 LAB — CBC
HCT: 32.3 % — ABNORMAL LOW (ref 36.0–46.0)
Hemoglobin: 10.7 g/dL — ABNORMAL LOW (ref 12.0–15.0)
MCHC: 33.1 g/dL (ref 30.0–36.0)
RBC: 3.41 MIL/uL — ABNORMAL LOW (ref 3.87–5.11)
WBC: 9.1 10*3/uL (ref 4.0–10.5)

## 2011-08-11 MED ORDER — WARFARIN SODIUM 5 MG PO TABS
5.0000 mg | ORAL_TABLET | Freq: Once | ORAL | Status: AC
  Administered 2011-08-11: 5 mg via ORAL
  Filled 2011-08-11: qty 1

## 2011-08-11 NOTE — Progress Notes (Signed)
CARE MANAGEMENT NOTE 08/11/2011 Discharge planning. Spoke with patient. Choice offered. Preoperatively seetup with [facility], no changes. Pt has rolling walker and 3in1. Has family support and assistance at discharge.

## 2011-08-11 NOTE — Progress Notes (Signed)
Patient ID: Joanna Williams, female   DOB: 1941/02/11, 71 y.o.   MRN: [id number] PATIENT ID: Joanna Williams  MRN: [id number]  DOB/AGE:  1941-02-16 / 71 y.o.  1 Day Post-Op Procedure(s) (LRB): TOTAL HIP ARTHROPLASTY (Right)    PROGRESS NOTE Subjective: Patient is alert, oriented,noNausea, no Vomiting, yes passing gas, no Bowel Movement. Taking PO sips. Denies SOB, Chest or Calf Pain. Using Incentive Spirometer, PAS in place. Ambulate WBAT Patient reports pain as 2 on 0-10 scale  .    Objective: Vital signs in last 24 hours: Filed Vitals:   08/10/11 1500 08/10/11 1501 08/10/11 1550 08/10/11 2240  BP:  112/63 136/55 107/63  Pulse: 55 54 55 82  Temp: 97.8 F (36.6 C)  97.7 F (36.5 C) 98.2 F (36.8 C)  TempSrc:   Oral   Resp: 12 16 16 16   Height:  5\' 4"  (1.626 m)    Weight:  68.72 kg (151 lb 8 oz)    SpO2: 100% 100% 100% 98%      Intake/Output from previous day: I/O last 3 completed shifts: In: 1750 [I.V.:1750] Out: 425 [Urine:225; Blood:200]   Intake/Output this shift: Total I/O In: 1984.6 [P.O.:720; I.V.:1264.6] Out: 1200 [Urine:1200]   LABORATORY DATA:  Basename 08/11/11 0614  WBC 9.1  HGB 10.7*  HCT 32.3*  PLT 222  NA --  K --  CL --  CO2 --  BUN --  CREATININE --  GLUCOSE --  GLUCAP --  INR 1.14  CALCIUM --    Examination: Neurologically intact ABD soft Neurovascular intact Sensation intact distally Intact pulses distally Dorsiflexion/Plantar flexion intact Incision: scant drainage No cellulitis present Compartment soft} XR AP&Lat of hip shows well placed\fixed THA  Assessment:   1 Day Post-Op Procedure(s) (LRB): TOTAL HIP ARTHROPLASTY (Right) ADDITIONAL DIAGNOSIS:    Plan: PT/OT WBAT, THA  posterior precautions  DVT Prophylaxis: Lovenox\Coumadin bridge, monitor INR 1.5-2.0 target  DISCHARGE PLAN: Home  DISCHARGE NEEDS: HHPT, HHRN, CPM, Walker and 3-in-1 comode seat

## 2011-08-11 NOTE — Evaluation (Signed)
Physical Therapy Evaluation Patient Details Name: Joanna Williams MRN: [id number] DOB: 1940/08/20 Today's Date: 08/11/2011  Problem List:  Patient Active Problem List  Diagnoses  . CANDIDIASIS, SKIN  . MOLE  . HYPERLIPIDEMIA  . ACUTE PHARYNGITIS  . UTI  . HIP PAIN, RIGHT  . CHEST PAIN UNSPECIFIED  . DYSURIA  . ABDOMINAL PAIN OTHER SPECIFIED SITE  . HYPOTHYROIDISM    Past Medical History:  Past Medical History  Diagnosis Date  . Hyperlipidemia    Past Surgical History:  Past Surgical History  Procedure Date  . Colonoscopy 2 yrs ago    PT Assessment/Plan/Recommendation PT Assessment Clinical Impression Statement: Pt. is s/p THR with decreased bed mobility, transfers and gait .  Will benefit from acute PT to address these issues.  Pt. did well first time up and anticipate DC home with HHPT. PT Recommendation/Assessment: Patient will need skilled PT in the acute care venue PT Problem List: Decreased activity tolerance;Decreased mobility;Decreased knowledge of use of DME;Decreased knowledge of precautions;Pain Barriers to Discharge: Inaccessible home environment Barriers to Discharge Comments: 14 steps to bedrooms PT Therapy Diagnosis : Difficulty walking;Acute pain PT Plan PT Frequency: 7X/week PT Treatment/Interventions: DME instruction;Gait training;Stair training;Functional mobility training;Therapeutic activities;Therapeutic exercise;Patient/family education PT Recommendation Follow Up Recommendations: Home health PT Equipment Recommended: None recommended by PT (has PT equipment in the home) PT Goals  Acute Rehab PT Goals PT Goal Formulation: With patient Time For Goal Achievement: 7 days Pt will go Supine/Side to Sit: with modified independence PT Goal: Supine/Side to Sit - Progress: Goal set today Pt will go Sit to Supine/Side: with modified independence PT Goal: Sit to Supine/Side - Progress: Goal set today Pt will go Sit to Stand: with modified  independence PT Goal: Sit to Stand - Progress: Goal set today Pt will go Stand to Sit: with modified independence PT Goal: Stand to Sit - Progress: Goal set today Pt will Ambulate: 51 - 150 feet;with modified independence;with rolling walker PT Goal: Ambulate - Progress: Goal set today Pt will Go Up / Down Stairs: 3-5 stairs;with rail(s);with least restrictive assistive device PT Goal: Up/Down Stairs - Progress: Goal set today Pt will Perform Home Exercise Program: with supervision, verbal cues required/provided PT Goal: Perform Home Exercise Program - Progress: Goal set today Additional Goals Additional Goal #1: pt. will verbalize and adhere to 3/3  posterior hip precautions PT Goal: Additional Goal #1 - Progress: Goal set today  PT Evaluation Precautions/Restrictions  Precautions Precautions: Posterior Hip Precaution Booklet Issued: Yes (comment) (reviewed hip precautions with pt. and provided handout) Required Braces or Orthoses: No Restrictions Weight Bearing Restrictions: Yes RLE Weight Bearing: Weight bearing as tolerated Other Position/Activity Restrictions: educated pt. on rational for placing pillow between knees while up in chair Prior Functioning  Home Living Lives With: Spouse Type of Home: House Home Layout: Two level Alternate Level Stairs-Rails: Right;Left;Can reach both Alternate Level Stairs-Number of Steps: 14 Home Access: Stairs to enter Entrance Stairs-Rails: Right;Can reach both;Left Entrance Stairs-Number of Steps: 4 Bathroom Shower/Tub: Psychologist, counselling;Door Bathroom Toilet: Standard Home Adaptive Equipment: Bedside commode/3-in-1;Raised toilet seat with rails;Walker - rolling Additional Comments: has built in seat in walk in shower but she feels like it is not big enough for her and swhe does not use it Prior Function Level of Independence: Independent with basic ADLs;Independent with homemaking with ambulation;Independent with gait;Independent with  transfers Driving: Yes Vocation: Retired Financial risk analyst Arousal/Alertness: Awake/alert Overall Cognitive Status: Appears within functional limits for tasks assessed Orientation Level: Oriented X4 Sensation/Coordination Sensation  Light Touch: Appears Intact (both lower legs) Extremity Assessment RLE Assessment RLE Assessment:  (good ankle pump and quad set) LLE Assessment LLE Assessment: Within Functional Limits Mobility (including Balance) Bed Mobility Bed Mobility: Yes Supine to Sit: 3: Mod assist;HOB flat Supine to Sit Details (indicate cue type and reason): vc's for technique and hip precautions Sit to Supine: Not Tested (comment) Transfers Transfers: Yes Sit to Stand: 3: Mod assist Sit to Stand Details (indicate cue type and reason): vc's for technique and hand placement Stand to Sit: 3: Mod assist Stand to Sit Details: vc's for hand placement and technique Ambulation/Gait Ambulation/Gait: Yes Ambulation/Gait Assistance: 4: Min assist (second person tp manage equipment) Ambulation/Gait Assistance Details (indicate cue type and reason): vc's for sequence and to eliminate "extra"step with right LE Ambulation Distance (Feet): 30 Feet Assistive device: Rolling walker Gait Pattern: Step-to pattern (extra step with right LE)    Exercise  Total Joint Exercises Ankle Circles/Pumps: AROM;Both;15 reps;Supine Quad Sets: AROM;Right;10 reps;Supine End of Session PT - End of Session Equipment Utilized During Treatment: Gait belt Activity Tolerance: Patient tolerated treatment well;Patient limited by pain (pt. had pain from IV in left hand with use of RW) Patient left: in chair;with call bell in reach Nurse Communication: Mobility status for transfers;Mobility status for ambulation;Weight bearing status General Behavior During Session: [facility] for tasks performed Cognition: [facility] for tasks performed  Ferman Hamming 08/11/2011, 11:52 AM Acute Rehabilitation  Services [phone] (pager)

## 2011-08-11 NOTE — Progress Notes (Signed)
Physical Therapy Treatment Patient Details Name: Joanna Williams MRN: [id number] DOB: January 16, 1941 Today's Date: 08/11/2011  PT Assessment/Plan  PT - Assessment/Plan Comments on Treatment Session: Making good progress with mobility. PT Plan: Discharge plan remains appropriate PT Frequency: 7X/week Follow Up Recommendations: Home health PT Equipment Recommended: None recommended by PT PT Goals  Acute Rehab PT Goals PT Goal Formulation: With patient Time For Goal Achievement: 7 days Pt will go Supine/Side to Sit: with modified independence PT Goal: Supine/Side to Sit - Progress: Progressing toward goal Pt will go Sit to Supine/Side: with modified independence PT Goal: Sit to Supine/Side - Progress: Progressing toward goal Pt will go Sit to Stand: with modified independence PT Goal: Sit to Stand - Progress: Progressing toward goal Pt will go Stand to Sit: with modified independence PT Goal: Stand to Sit - Progress: Progressing toward goal Pt will Ambulate: 51 - 150 feet;with modified independence;with rolling walker PT Goal: Ambulate - Progress: Progressing toward goal Pt will Go Up / Down Stairs: 3-5 stairs;with rail(s);with least restrictive assistive device PT Goal: Up/Down Stairs - Progress: Goal set today Pt will Perform Home Exercise Program: with supervision, verbal cues required/provided PT Goal: Perform Home Exercise Program - Progress: Goal set today Additional Goals Additional Goal #1: pt. will verbalize and adhere to 3/3  posterior hip precautions PT Goal: Additional Goal #1 - Progress: Goal set today  PT Treatment Precautions/Restrictions  Precautions Precautions: Posterior Hip Precaution Booklet Issued: Yes (comment) (reviewed hip precautions with pt. and provided handout) Precaution Comments: pt. stated 2/3 precautions Required Braces or Orthoses: No Restrictions Weight Bearing Restrictions: Yes RLE Weight Bearing: Weight bearing as tolerated Other  Position/Activity Restrictions: educated pt. on rational for placing pillow between knees while up in chair Mobility (including Balance) Bed Mobility Bed Mobility: Yes Supine to Sit: 4: Min assist;HOB elevated (Comment degrees) (11 degrees) Supine to Sit Details (indicate cue type and reason): min assist primarily for right LE Sit to Supine: 4: Min assist Sit to Supine - Details (indicate cue type and reason): assist for right LE Transfers Transfers: Yes Sit to Stand: 4: Min assist Sit to Stand Details (indicate cue type and reason): vc's for technique and hand placement Stand to Sit: 4: Min assist Stand to Sit Details: reminders for technique Ambulation/Gait Ambulation/Gait: Yes Ambulation/Gait Assistance: 4: Min assist Ambulation/Gait Assistance Details (indicate cue type and reason): reminders for gait sequence Ambulation Distance (Feet): 25 Feet Assistive device: Rolling walker Gait Pattern: Step-to pattern      End of Session PT - End of Session Equipment Utilized During Treatment: Gait belt (bed pillow between knees once back in bed) Activity Tolerance: Patient tolerated treatment well Patient left: in bed;with call bell in reach Nurse Communication: Mobility status for transfers;Mobility status for ambulation;Weight bearing status General Behavior During Session: [facility] for tasks performed Cognition: [facility] for tasks performed  Ferman Hamming 08/11/2011, 2:21 PM Acute Rehabilitation Services [phone] (pager)

## 2011-08-11 NOTE — Progress Notes (Signed)
ANTICOAGULATION CONSULT NOTE - Follow Up Consult  Pharmacy Consult for coumadin Indication: VTE prophylaxis s/p THA 08/10/11  Allergies  Allergen Reactions  . Niacin     REACTION: Nausea    Patient Measurements: Height: 5\' 4"  (162.6 cm) Weight: 151 lb 8 oz (68.72 kg) IBW/kg (Calculated) : 54.7    Vital Signs: Temp: 98.8 F (37.1 C) (02/07 0610) BP: 99/56 mmHg (02/07 0610) Pulse Rate: 71  (02/07 0610)  Labs:  Basename 08/11/11 0614  HGB 10.7*  HCT 32.3*  PLT 222  APTT --  LABPROT 14.8  INR 1.14  HEPARINUNFRC --  CREATININE 0.64  CKTOTAL --  CKMB --  TROPONINI --   Estimated Creatinine Clearance: 61.4 ml/min (by C-G formula based on Cr of 0.64).    Assessment: 71 yo F s/p THA 08/10/11 on coumadin for VTE prophylasix. INR 1.14 after 5mg  x1 dose. No bleeding reported. Goal of Therapy:  INR 1.5 - 2 per MD note 08/11/11   Plan:  Repeat coumadin 5mg  x1, LMWH 40mg  q24 until INR > = 1.8. Daily INRs  Joanna Williams T 08/11/2011,10:10 AM

## 2011-08-11 NOTE — Progress Notes (Signed)
UR COMPLETED  

## 2011-08-12 ENCOUNTER — Encounter (HOSPITAL_COMMUNITY): Payer: Self-pay | Admitting: Orthopedic Surgery

## 2011-08-12 LAB — PROTIME-INR: INR: 1.33 (ref 0.00–1.49)

## 2011-08-12 LAB — CBC
HCT: 32.1 % — ABNORMAL LOW (ref 36.0–46.0)
MCV: 94.1 fL (ref 78.0–100.0)
Platelets: 208 10*3/uL (ref 150–400)
RBC: 3.41 MIL/uL — ABNORMAL LOW (ref 3.87–5.11)
RDW: 13.5 % (ref 11.5–15.5)
WBC: 13.5 10*3/uL — ABNORMAL HIGH (ref 4.0–10.5)

## 2011-08-12 MED ORDER — WARFARIN SODIUM 5 MG PO TABS: 5.0000 mg | ORAL_TABLET | Freq: Every day | ORAL | Status: DC

## 2011-08-12 MED ORDER — OXYCODONE-ACETAMINOPHEN 5-325 MG PO TABS: 1.0000 | ORAL_TABLET | ORAL | Status: AC | PRN

## 2011-08-12 MED ORDER — WARFARIN SODIUM 5 MG PO TABS
5.0000 mg | ORAL_TABLET | Freq: Once | ORAL | Status: AC
  Administered 2011-08-12: 5 mg via ORAL
  Filled 2011-08-12: qty 1

## 2011-08-12 NOTE — Progress Notes (Signed)
Physical Therapy Treatment Patient Details Name: LITITIA SEN MRN: [id number] DOB: 1940-10-18 Today's Date: 08/12/2011  PT Assessment/Plan  PT - Assessment/Plan Comments on Treatment Session: Continuing to progress. PT Plan: Discharge plan remains appropriate PT Frequency: 7X/week Follow Up Recommendations: Home health PT Equipment Recommended: None recommended by PT PT Goals  Acute Rehab PT Goals PT Goal: Supine/Side to Sit - Progress: Progressing toward goal PT Goal: Sit to Supine/Side - Progress: Progressing toward goal PT Goal: Sit to Stand - Progress: Progressing toward goal PT Goal: Stand to Sit - Progress: Progressing toward goal PT Goal: Ambulate - Progress: Progressing toward goal PT Goal: Up/Down Stairs - Progress: Progressing toward goal PT Goal: Perform Home Exercise Program - Progress: Progressing toward goal  PT Treatment Precautions/Restrictions  Precautions Precautions: Posterior Hip Precaution Booklet Issued: Yes (comment) Precaution Comments: pt. still only able to state 2/3 precautions; had to be reminded of no crossing legs Required Braces or Orthoses: No Restrictions Weight Bearing Restrictions: Yes RLE Weight Bearing: Weight bearing as tolerated Other Position/Activity Restrictions: educated pt. on rational for placing pillow between knees while up in chair Mobility (including Balance) Bed Mobility Supine to Sit: 4: Min assist Supine to Sit Details (indicate cue type and reason): for right LE Sit to Supine: 4: Min assist Sit to Supine - Details (indicate cue type and reason): for right LE Transfers Transfers: Yes Sit to Stand: 4: Min assist;With upper extremity assist;From bed Sit to Stand Details (indicate cue type and reason): continue to need reminders for hand placement Stand to Sit: 4: Min assist Stand to Sit Details: reminders for hand placement Ambulation/Gait Ambulation/Gait: Yes Ambulation/Gait Assistance: 4: Min assist Ambulation  Distance (Feet): 120 Feet Assistive device: Rolling walker Gait Pattern: Step-to pattern;Decreased hip/knee flexion - right;Trunk flexed Stairs: Yes Stairs Assistance: 4: Min assist Stairs Assistance Details (indicate cue type and reason): cues for technique and hip precautions Stair Management Technique: One rail Right;Step to pattern;Sideways Number of Stairs: 2     Exercise    End of Session PT - End of Session Equipment Utilized During Treatment: Gait belt Activity Tolerance: Patient tolerated treatment well (slight nausea persists) Patient left: in bed General Behavior During Session: [facility] for tasks performed Cognition: [facility] for tasks performed  Ferman Hamming 08/12/2011, 3:49 PM Acute Rehabilitation Services [phone] (pager)

## 2011-08-12 NOTE — Progress Notes (Signed)
Occupational Therapy Evaluation Patient Details Name: Joanna Williams MRN: [id number] DOB: [birth year] Today's Date: 08/12/2011  Problem List:  Patient Active Problem List  Diagnoses  . CANDIDIASIS, SKIN  . MOLE  . HYPERLIPIDEMIA  . ACUTE PHARYNGITIS  . UTI  . HIP PAIN, RIGHT  . CHEST PAIN UNSPECIFIED  . DYSURIA  . ABDOMINAL PAIN OTHER SPECIFIED SITE  . HYPOTHYROIDISM    Past Medical History:  Past Medical History  Diagnosis Date  . Hyperlipidemia    Past Surgical History:  Past Surgical History  Procedure Date  . Colonoscopy 2 yrs ago    OT Assessment/Plan/Recommendation OT Assessment Clinical Impression Statement: 71 yo female s/p R THA. Completed all necessary information regarding use of AE for ADL following posterior hip precautions. Pt able to return demonstrate. Pt also completed shower transfer with supervision - backing into shower. Written information given to pt on ADL, hip precautions and shower transfers. Pt has nec DME. No further OT needed. OT signing off. Pt will have adequate help after D/C at home to assist as needed. OT Recommendation/Assessment: Patient does not need any further OT services OT Recommendation Follow Up Recommendations: No OT follow up Equipment Recommended: None recommended by PT OT Goals Acute Rehab OT Goals OT Goal Formulation:  (eval only)  OT Evaluation Precautions/Restrictions  Precautions Precautions: Posterior Hip Precaution Booklet Issued: Yes (comment) Precaution Comments: pt. still only able to state 2/3 precautions; had to be reminded of no crossing legs Required Braces or Orthoses: No Restrictions Weight Bearing Restrictions: Yes RLE Weight Bearing: Weight bearing as tolerated Other Position/Activity Restrictions: educated pt. on rational for placing pillow between knees while up in chair Prior Functioning Home Living Lives With: Spouse Receives Help From: Family Type of Home: House Home Layout: Two  level Alternate Level Stairs-Rails: Right;Left;Can reach both Alternate Level Stairs-Number of Steps: 14 Home Access: Stairs to enter Entrance Stairs-Rails: Right;Can reach both;Left Entrance Stairs-Number of Steps: 4 Bathroom Shower/Tub: Psychologist, counselling;Door [employer] Toilet: Standard Bathroom Accessibility: Yes How Accessible: Accessible via walker Home Adaptive Equipment: Bedside commode/3-in-1;Raised toilet seat with rails;Walker - rolling Additional Comments: has built in seat in walk in shower but she feels like it is not big enough for her and swhe does not use it Prior Function Level of Independence: Independent with basic ADLs;Independent with homemaking with ambulation;Independent with gait;Independent with transfers Able to Take Stairs?: Yes Driving: Yes Vocation: Retired ADL ADL Eating/Feeding: Simulated;Independent Where Assessed - Eating/Feeding: Chair Grooming: Performed;Modified independent Where Assessed - Grooming: Standing at sink Upper Body Bathing: Simulated;Modified independent Where Assessed - Upper Body Bathing: Standing at sink Lower Body Bathing: Simulated;Minimal assistance Where Assessed - Lower Body Bathing: Sit to stand from bed;Unsupported Upper Body Dressing: Simulated;Modified independent Where Assessed - Upper Body Dressing: Sitting, bed Lower Body Dressing: Performed;Minimal assistance Lower Body Dressing Details (indicate cue type and reason): education on AE for LB D Where Assessed - Lower Body Dressing: Sit to stand from bed Toilet Transfer: Performed;Supervision/safety Toilet Transfer Method: Proofreader: Bedside commode Toileting - Clothing Manipulation: Independent Where Assessed - Glass blower/designer Manipulation: Standing Toileting - Hygiene: Independent Where Assessed - Toileting Hygiene: Standing Tub/Shower Transfer: Supervision/safety (discussed use of DME) Tub/Shower Transfer Method: Ambulating (Back  over) Tub/Shower Transfer Equipment: Grab bars;Shower seat with back Equipment Used: Long-handled shoe horn;Long-handled sponge;Reacher;Rolling walker;Sock aid Ambulation Related to ADLs: supervision ADL Comments: Min A for LB ADL only Vision/Perception  Vision - History Baseline Vision: Wears glasses all the time Patient Visual Report: No change from baseline  Vision - Assessment Eye Alignment: Within Functional Limits Perception Perception: Within Functional Limits Praxis Praxis: Intact Cognition Cognition Arousal/Alertness: Awake/alert Overall Cognitive Status: Appears within functional limits for tasks assessed Orientation Level: Oriented X4 Sensation/Coordination Sensation Light Touch: Appears Intact Coordination Gross Motor Movements are Fluid and Coordinated: Yes Fine Motor Movements are Fluid and Coordinated: Yes Extremity Assessment RUE Assessment RUE Assessment: Within Functional Limits LUE Assessment LUE Assessment: Within Functional Limits Mobility  Bed Mobility Bed Mobility: No Supine to Sit: 4: Min assist;HOB elevated (Comment degrees);Other (comment) (HOB 27 degrees) Supine to Sit Details (indicate cue type and reason): assist primarily for moving of right LE to EOB, vc's for upper body technique Sit to Supine: Not Tested (comment) Transfers Transfers: Yes Sit to Stand: 4: Min assist;From bed;Without upper extremity assist Sit to Stand Details (indicate cue type and reason): reminder for use of arms on bed to push up Stand to Sit: 4: Min assist Stand to Sit Details: reminder to extend right LE out before sitting Exercises Total Joint Exercises Ankle Circles/Pumps: AROM;Both;15 reps;Supine Quad Sets: AROM;Right;15 reps;Supine Short Arc Quad: AROM;Right;Other reps (comment);Supine (15 reps) Heel Slides: AROM;Right;10 reps;Supine Hip ABduction/ADduction: AROM;Right;10 reps End of Session OT - End of Session Equipment Utilized During Treatment: Gait  belt Activity Tolerance: Patient limited by fatigue Patient left: in chair;with call bell in reach Nurse Communication: Mobility status for transfers General Behavior During Session: [facility] for tasks performed Cognition: [facility] for tasks performed   [facility] 08/12/2011, 11:53 AM  [facility], OTR/L  [phone] 08/12/2011

## 2011-08-12 NOTE — Progress Notes (Signed)
PATIENT ID: Joanna Williams  MRN: [id number]  DOB/AGE:  Dec 23, 1940 / 71 y.o.  2 Days Post-Op Procedure(s) (LRB): TOTAL HIP ARTHROPLASTY (Right)    PROGRESS NOTE Subjective: Patient is alert, oriented,noNausea, no Vomiting, yes passing gas, no Bowel Movement. Taking PO well. Denies SOB, Chest or Calf Pain. Using Incentive Spirometer, PAS in place. Ambulating with PT. Patient reports pain as moderate  .    Objective: Vital signs in last 24 hours: Filed Vitals:   08/11/11 1030 08/11/11 1350 08/11/11 2209 08/12/11 0514  BP: 98/54 92/52 106/53 104/42  Pulse: 77 80 96 110  Temp: 98.2 F (36.8 C) 99.1 F (37.3 C) 99.5 F (37.5 C) 98.2 F (36.8 C)  TempSrc: Axillary Oral    Resp: 16 16 18 20   Height:      Weight:      SpO2: 94% 94% 93% 93%      Intake/Output from previous day: I/O last 3 completed shifts: In: 3539.6 [P.O.:2050; I.V.:1489.6] Out: 2451 [Urine:2451]   Intake/Output this shift:     LABORATORY DATA:  Basename 08/12/11 0550 08/11/11 0614  WBC 13.5* 9.1  HGB 10.6* 10.7*  HCT 32.1* 32.3*  PLT 208 222  NA -- 137  K -- 4.1  CL -- 104  CO2 -- 28  BUN -- 8  CREATININE -- 0.64  GLUCOSE -- 120*  GLUCAP -- --  INR 1.33 1.14  CALCIUM -- 8.2*    Examination: Neurologically intact ABD soft Neurovascular intact Sensation intact distally Intact pulses distally Dorsiflexion/Plantar flexion intact Incision: scant drainage} XR AP&Lat of hip shows well placed\fixed THA  Assessment:   2 Days Post-Op Procedure(s) (LRB): TOTAL HIP ARTHROPLASTY (Right) ADDITIONAL DIAGNOSIS:  none  Plan: PT/OT WBAT, THA  posterior precautions  DVT Prophylaxis: Lovenox\Coumadin bridge, monitor INR 1.5-2.0 target  DISCHARGE PLAN: Home Saturday  DISCHARGE NEEDS: HHPT, HHRN, Walker and 3-in-1 comode seat

## 2011-08-12 NOTE — Progress Notes (Signed)
Physical Therapy Treatment Patient Details Name: Joanna Williams MRN: [id number] DOB: 26-Jul-1940 Today's Date: 08/12/2011  PT Assessment/Plan  PT - Assessment/Plan Comments on Treatment Session: Pt. continues to make good progress and anticipate she will be ready for DC on tomorrow. PT Plan: Discharge plan remains appropriate PT Frequency: 7X/week Follow Up Recommendations: Home health PT Equipment Recommended: None recommended by PT PT Goals  Acute Rehab PT Goals PT Goal: Supine/Side to Sit - Progress: Progressing toward goal PT Goal: Sit to Stand - Progress: Progressing toward goal PT Goal: Stand to Sit - Progress: Progressing toward goal PT Goal: Ambulate - Progress: Progressing toward goal PT Goal: Up/Down Stairs - Progress: Progressing toward goal PT Goal: Perform Home Exercise Program - Progress: Progressing toward goal Additional Goals PT Goal: Additional Goal #1 - Progress: Progressing toward goal  PT Treatment Precautions/Restrictions  Precautions Precautions: Posterior Hip Precaution Booklet Issued: Yes (comment) (reviewed hip precautions with pt. and provided handout) Precaution Comments: pt. still only able to state 2/3 precautions; had to be reminded of no crossing legs Required Braces or Orthoses: No Restrictions Weight Bearing Restrictions: Yes RLE Weight Bearing: Weight bearing as tolerated Other Position/Activity Restrictions: educated pt. on rational for placing pillow between knees while up in chair Mobility (including Balance) Bed Mobility Supine to Sit: 4: Min assist;HOB elevated (Comment degrees);Other (comment) (HOB 27 degrees) Supine to Sit Details (indicate cue type and reason): assist primarily for moving of right LE to EOB, vc's for upper body technique Sit to Supine: Not Tested (comment) Transfers Transfers: Yes Sit to Stand: 4: Min assist;From bed;Without upper extremity assist Sit to Stand Details (indicate cue type and reason): reminder for use  of arms on bed to push up Stand to Sit: 4: Min assist Stand to Sit Details: reminder to extend right LE out before sitting Ambulation/Gait Ambulation/Gait: Yes Ambulation/Gait Assistance: 4: Min assist;Other (comment) (min guard assist) Ambulation/Gait Assistance Details (indicate cue type and reason): one reminder for correct sequence Ambulation Distance (Feet): 120 Feet Assistive device: Rolling walker Gait Pattern: Step-to pattern;Decreased hip/knee flexion - right;Trunk flexed Stairs: Yes Stairs Assistance: 4: Min assist Stairs Assistance Details (indicate cue type and reason): cues for correct technique and to maintain hip precautions  Stair Management Technique: One rail Right;Sideways Number of Stairs: 2  Height of Stairs: 6     Exercise  Total Joint Exercises Ankle Circles/Pumps: AROM;Both;15 reps;Supine Quad Sets: AROM;Right;15 reps;Supine Short Arc Quad: AROM;Right;Other reps (comment);Supine (15 reps) Heel Slides: AROM;Right;10 reps;Supine Hip ABduction/ADduction: AROM;Right;10 reps End of Session PT - End of Session Equipment Utilized During Treatment: Gait belt Activity Tolerance: Patient tolerated treatment well (pt. c/o slight nausea this am) Patient left: in chair;with call bell in reach;Other (comment) (pt. left with OT for eval) General Behavior During Session: [facility] for tasks performed Cognition: [facility] for tasks performed  Ferman Hamming 08/12/2011, 9:50 AM Acute Rehabilitation Services [phone] (pager)

## 2011-08-12 NOTE — Progress Notes (Signed)
ANTICOAGULATION CONSULT NOTE - Follow Up Consult  Pharmacy Consult for coumadin Indication: VTE prophylaxis s/p THA 08/10/11  Allergies  Allergen Reactions  . Niacin     REACTION: Nausea    Patient Measurements: Height: 5\' 4"  (162.6 cm) Weight: 151 lb 8 oz (68.72 kg) IBW/kg (Calculated) : 54.7    Vital Signs: Temp: 98.2 F (36.8 C) (02/08 0514) BP: 104/42 mmHg (02/08 0514) Pulse Rate: 110  (02/08 0514)  Labs:  Basename 08/12/11 0550 08/11/11 0614  HGB 10.6* 10.7*  HCT 32.1* 32.3*  PLT 208 222  APTT -- --  LABPROT 16.7* 14.8  INR 1.33 1.14  HEPARINUNFRC -- --  CREATININE -- 0.64  CKTOTAL -- --  CKMB -- --  TROPONINI -- --   Estimated Creatinine Clearance: 61.4 ml/min (by C-G formula based on Cr of 0.64).    Assessment: 71 yo F s/p THA 08/10/11 on coumadin for VTE prophylasix. INR rising nicely after 2 x 5mg  doses. No bleeding reported.  Goal of Therapy:  INR 1.5 - 2 per MD note 08/11/11   Plan:  Repeat coumadin 5mg  x1 LMWH 40mg  q24 until INR > = 1.8.  Daily INRs   [employer], Pharm.D., BCPS Clinical Pharmacist Pager [phone]  08/12/2011,10:01 AM

## 2011-08-13 LAB — CBC
HCT: 31 % — ABNORMAL LOW (ref 36.0–46.0)
Hemoglobin: 10.4 g/dL — ABNORMAL LOW (ref 12.0–15.0)
MCHC: 33.5 g/dL (ref 30.0–36.0)
MCV: 93.4 fL (ref 78.0–100.0)
RDW: 13.3 % (ref 11.5–15.5)

## 2011-08-13 LAB — PROTIME-INR: INR: 1.5 — ABNORMAL HIGH (ref 0.00–1.49)

## 2011-08-13 NOTE — Progress Notes (Signed)
Physical Therapy Treatment Patient Details Name: Joanna Williams MRN: [id number] DOB: 12/03/1940 Today's Date: 08/13/2011  PT Assessment/Plan  PT - Assessment/Plan Comments on Treatment Session: required less assistance and cues today with mobility. PT Plan: Discharge plan remains appropriate;Frequency remains appropriate PT Frequency: 7X/week Follow Up Recommendations: Home health PT Equipment Recommended: None recommended by PT  PT Goals  Acute Rehab PT Goals PT Goal: Supine/Side to Sit - Progress: Other (comment) (not addressed today) PT Goal: Sit to Supine/Side - Progress: Other (comment) (not addressed today) PT Goal: Sit to Stand - Progress: Met PT Goal: Stand to Sit - Progress: Progressing toward goal PT Goal: Ambulate - Progress: Progressing toward goal PT Goal: Up/Down Stairs - Progress: Progressing toward goal PT Goal: Perform Home Exercise Program - Progress: Met Additional Goals PT Goal: Additional Goal #1 - Progress: Met  PT Treatment Precautions/Restrictions  Precautions Precautions: Posterior Hip Precaution Booklet Issued: Yes (comment) Precaution Comments: pt able to recall 3/3 posterior hip precautions without cues/assit. Required Braces or Orthoses: No Restrictions Weight Bearing Restrictions: Yes RLE Weight Bearing: Weight bearing as tolerated  Mobility (including Balance) Bed Mobility Bed Mobility: No (pt in chair upon arrival to room and back to chair after PT) Transfers Transfers: Yes Sit to Stand: With upper extremity assist;From chair/3-in-1;6: Modified independent (Device/Increase time);With armrests Sit to Stand Details (indicate cue type and reason): no cues or assist needed. increased time needed to achieve standing safely. Stand to Sit: 5: Supervision;With upper extremity assist;With armrests;To chair/3-in-1 Stand to Sit Details: one verbal cues to slide right foot forward with sitting due to hip precautions. Ambulation/Gait Ambulation/Gait  Assistance: 5: Supervision Ambulation/Gait Assistance Details (indicate cue type and reason): cues to advance gait pattern from step-to to a reciprocal gait pattern. cues to increase right hip/knee flexion with swing phase and to decrease circumduction of right hip with swing phase. occasional cues to look forward vs at floor for more upright posture. Ambulation Distance (Feet): 180 Feet Assistive device: Rolling walker Gait Pattern: Step-to pattern;Step-through pattern;Decreased step length - left;Decreased stride length;Right circumduction;Decreased hip/knee flexion - right;Trunk flexed;Antalgic Stairs Assistance: 6: Modified independent (Device/Increase time) Stairs Assistance Details (indicate cue type and reason): no cues/assist needed. pt able to independently and safely demonstrate stair technique she was educated on last treatment session. Stair Management Technique: One rail Right;Step to pattern;Sideways Number of Stairs: 2     Exercise  Total Joint Exercises Ankle Circles/Pumps: AROM;Both;10 reps;Seated Quad Sets: AROM;Right;10 reps;Seated Hip ABduction/ADduction: AROM;Right;10 reps;Standing Marching in Standing: AROM;Right;10 reps;Standing  End of Session PT - End of Session Equipment Utilized During Treatment: Gait belt Activity Tolerance: Patient tolerated treatment well Patient left: in chair;with call bell in reach Nurse Communication: Mobility status for transfers;Mobility status for ambulation General Behavior During Session: [facility] for tasks performed Cognition: [facility] for tasks performed  Sallyanne Kuster 08/13/2011, 9:18 AM  Sallyanne Kuster, PTA Office- [phone]

## 2011-08-13 NOTE — Progress Notes (Signed)
PATIENT ID: [name]  MRN: [id number]  DOB/AGE:  1940-10-31 / 71 y.o.  3 Days Post-Op Procedure(s) (LRB): TOTAL HIP ARTHROPLASTY (Right)  Subjective: Pain is mild.  No c/o chest pain or SOB.   Ready to go home today.   Objective: Vital signs in last 24 hours: Temp:  [97.5 F (36.4 C)-98.5 F (36.9 C)] 98.5 F (36.9 C) (02/08 2100) Pulse Rate:  [82-87] 83  (02/08 2100) Resp:  [16-18] 17  (02/08 2100) BP: (99-118)/(58-69) 118/69 mmHg (02/08 2100) SpO2:  [92 %-93 %] 93 % (02/08 2100)  Intake/Output from previous day: 02/08 0701 - 02/09 0700 In: 470 [P.O.:470] Out: -  Intake/Output this shift: Total I/O In: 120 [P.O.:120] Out: -    Basename 08/12/11 0550 08/11/11 0614  HGB 10.6* 10.7*    Basename 08/12/11 0550 08/11/11 0614  WBC 13.5* 9.1  RBC 3.41* 3.41*  HCT 32.1* 32.3*  PLT 208 222    Basename 08/11/11 0614  NA 137  K 4.1  CL 104  CO2 28  BUN 8  CREATININE 0.64  GLUCOSE 120*  CALCIUM 8.2*    Basename 08/12/11 0550 08/11/11 0614  LABPT -- --  INR 1.33 1.14    Physical Exam: Neurovascular intact Sensation intact distally Dorsiflexion/Plantar flexion intact Incision: scant drainage  Assessment/Plan: 3 Days Post-Op Procedure(s) (LRB): TOTAL HIP ARTHROPLASTY (Right)   Up with therapy Weight Bearing as Tolerated (WBAT)  VTE prophylaxis: pharmacologic prophylaxis (with any of the following: warfarin adjusted-dose) Plan for d/c home today, f/u with Dr. Turner Daniels as scheduled.   Joanna Williams 08/13/2011, 5:40 AM

## 2011-08-13 NOTE — Discharge Summary (Signed)
Patient ID: Joanna Williams MRN: [id number] DOB/AGE: Mar 13, 1941 71 y.o.  Admit date: 08/10/2011 Discharge date: 08/13/2011  Admission Diagnoses:  R hip DJD  Discharge Diagnoses:  Same  Past Medical History  Diagnosis Date  . Hyperlipidemia     Surgeries: Procedure(s): TOTAL HIP ARTHROPLASTY on 08/10/2011   Consultants:    Discharged Condition: Improved  Hospital Course: Joanna Williams is an 71 y.o. female who was admitted 08/10/2011 for operative treatment of R hip DJD. Patient has severe unremitting pain that affects sleep, daily activities, and work/hobbies. After pre-op clearance the patient was taken to the operating room on 08/10/2011 and underwent  Procedure(s): TOTAL HIP ARTHROPLASTY.    Patient was given perioperative antibiotics: Anti-infectives     Start     Dose/Rate Route Frequency Ordered Stop   08/10/11 0000   ceFAZolin (ANCEF) IVPB 1 g/50 mL premix        1 g 100 mL/hr over 30 Minutes Intravenous 60 min pre-op 08/09/11 1505 08/10/11 1140           Patient was given sequential compression devices, early ambulation, and chemoprophylaxis to prevent DVT.  Patient benefited maximally from hospital stay and there were no complications.    Recent vital signs: Patient Vitals for the past 24 hrs:  BP Temp Temp src Pulse Resp SpO2  08/12/11 2100 118/69 mmHg 98.5 F (36.9 C) Oral 83  17  93 %  08/12/11 1300 108/58 mmHg 98.1 F (36.7 C) Oral 82  18  92 %  08/12/11 1101 99/61 mmHg 97.5 F (36.4 C) Oral 87  16  93 %     Recent laboratory studies:  Basename 08/12/11 0550 August 25, 2011 0614  WBC 13.5* 9.1  HGB 10.6* 10.7*  HCT 32.1* 32.3*  PLT 208 222  NA -- 137  K -- 4.1  CL -- 104  CO2 -- 28  BUN -- 8  CREATININE -- 0.64  GLUCOSE -- 120*  INR 1.33 1.14  CALCIUM -- 8.2*     Discharge Medications:   Medication List  As of 08/13/2011  5:43 AM   STOP taking these medications         HYDROcodone-acetaminophen 5-325 MG per tablet      meloxicam 15 MG  tablet         TAKE these medications         omeprazole 20 MG tablet   Commonly known as: PRILOSEC OTC   Take 20 mg by mouth daily.      oxyCODONE-acetaminophen 5-325 MG per tablet   Commonly known as: PERCOCET   Take 1-2 tablets by mouth every 4 (four) hours as needed for pain.      simvastatin 40 MG tablet   Commonly known as: ZOCOR   Take 40 mg by mouth every evening.      warfarin 5 MG tablet   Commonly known as: COUMADIN   Take 1 tablet (5 mg total) by mouth daily.      zolpidem 10 MG tablet   Commonly known as: AMBIEN   Take 10 mg by mouth at bedtime as needed. For sleep            Diagnostic Studies: Dg Pelvis Portable  08/10/2011  *RADIOLOGY REPORT*  Clinical Data: Post right hip replacement  PORTABLE PELVIS  Comparison: None.  Findings: The femoral and acetabular components of the right hip replacement are in good position.  The distal tip of the femoral component is not included on the current films.  Air in the soft tissues overlying the right hip is noted postoperatively.  No acute bony abnormality is seen.  There are degenerative changes present in the lower lumbar spine.  IMPRESSION: Right total hip replacement good position.  The tip of the femoral component is not visualized.  Original Report Authenticated By: Juline Patch, M.D.   Dg Hip Portable 1 View Right  08/10/2011  *RADIOLOGY REPORT*  Clinical Data: Postop right hip arthroplasty.  PORTABLE RIGHT HIP - 1 VIEW  Comparison: 05/10/2010.  Findings: Cross-table lateral views demonstrate interval right total hip arthroplasty.  The hardware appears well positioned. There is no evidence of acute fracture or dislocation.  IMPRESSION: No demonstrated complication following right total hip arthroplasty.  Original Report Authenticated By: Gerrianne Scale, M.D.    Disposition: Final discharge disposition not confirmed  Discharge Orders    Future Orders Please Complete By Expires   Diet - low sodium heart healthy       Increase activity slowly      Walker       May shower / Bathe      Driving Restrictions      Comments:   No driving for 2 weeks.   Change dressing (specify)      Comments:   Dressing change as needed.   Call MD for:  temperature >100.4      Call MD for:  severe uncontrolled pain      Call MD for:  redness, tenderness, or signs of infection (pain, swelling, redness, odor or green/yellow discharge around incision site)      Call MD / Call 911      Comments:   If you experience chest pain or shortness of breath, CALL 911 and be transported to the hospital emergency room.  If you develope a fever above 101 F, pus (white drainage) or increased drainage or redness at the wound, or calf pain, call your surgeon's office.   Constipation Prevention      Comments:   Drink plenty of fluids.  Prune juice may be helpful.  You may use a stool softener, such as Colace (over the counter) 100 mg twice a day.  Use MiraLax (over the counter) for constipation as needed.   Increase activity slowly as tolerated      Weight Bearing as taught in Physical Therapy      Comments:   Use a walker or crutches as instructed.      Follow-up Information    Follow up with Nestor Lewandowsky, MD in 1 week.   Contact information:   [facility] [address] [place] [address] [phone]           Signed: Mable Paris 08/13/2011, 5:43 AM

## 2011-08-18 ENCOUNTER — Telehealth: Payer: Self-pay | Admitting: Family Medicine

## 2011-08-18 MED ORDER — SIMVASTATIN 40 MG PO TABS: 40.0000 mg | ORAL_TABLET | Freq: Every evening | ORAL | Status: DC

## 2011-08-18 NOTE — Telephone Encounter (Signed)
Faxed.   KP 

## 2011-08-18 NOTE — Telephone Encounter (Signed)
Refill-simvastatin 40mg  tablet. Take one tablet (40mg  total) by mouth at bedtime. Qty 30 last fill 1.3.13

## 2011-08-29 ENCOUNTER — Telehealth: Payer: Self-pay | Admitting: Family Medicine

## 2011-08-29 NOTE — Telephone Encounter (Signed)
Refill: Zolpidem tartrate 10 mg tablet. Take 1 tablet every 3rd night at bedtime as needed.

## 2011-08-29 NOTE — Telephone Encounter (Signed)
Last seen 04/04/11 and filled 09/07/10 # 30. please advise    KP

## 2011-08-29 NOTE — Telephone Encounter (Signed)
Ok to refill x 1  

## 2011-08-30 MED ORDER — ZOLPIDEM TARTRATE 10 MG PO TABS: 10.0000 mg | ORAL_TABLET | Freq: Every evening | ORAL | Status: DC | PRN

## 2011-09-22 ENCOUNTER — Other Ambulatory Visit (INDEPENDENT_AMBULATORY_CARE_PROVIDER_SITE_OTHER): Payer: Medicare Other

## 2011-09-22 DIAGNOSIS — E785 Hyperlipidemia, unspecified: Secondary | ICD-10-CM

## 2011-09-22 LAB — LDL CHOLESTEROL, DIRECT: Direct LDL: 132.2 mg/dL

## 2011-09-22 LAB — LIPID PANEL
Total CHOL/HDL Ratio: 3
Triglycerides: 148 mg/dL (ref 0.0–149.0)

## 2011-09-23 ENCOUNTER — Encounter: Payer: Self-pay | Admitting: *Deleted

## 2011-09-23 MED ORDER — SIMVASTATIN 40 MG PO TABS: 40.0000 mg | ORAL_TABLET | Freq: Every evening | ORAL | Status: DC

## 2011-11-03 ENCOUNTER — Other Ambulatory Visit: Payer: Self-pay | Admitting: *Deleted

## 2011-11-03 ENCOUNTER — Other Ambulatory Visit: Payer: Self-pay | Admitting: Family Medicine

## 2011-11-03 MED ORDER — SIMVASTATIN 40 MG PO TABS: 40.0000 mg | ORAL_TABLET | Freq: Every evening | ORAL | Status: DC

## 2011-11-03 NOTE — Telephone Encounter (Signed)
Rx sent 

## 2011-11-03 NOTE — Telephone Encounter (Signed)
refill Simvastatin 40MG  Tablet Qty 30 Take one tablet by mouth every evening  Last filled 03.22.13 Last OV 10.1.2012

## 2011-12-08 ENCOUNTER — Telehealth: Payer: Self-pay | Admitting: Family Medicine

## 2011-12-08 MED ORDER — ZOLPIDEM TARTRATE 10 MG PO TABS: 10.0000 mg | ORAL_TABLET | Freq: Every evening | ORAL | Status: DC | PRN

## 2011-12-08 NOTE — Telephone Encounter (Signed)
Refill: Zolpidem tartrate 10mg tablet. Take 1 tablet by mouth at bedtime as needed for sleep.  

## 2011-12-08 NOTE — Telephone Encounter (Signed)
Last seen 04/04/11 and filled 08/30/11. Please advise    KP

## 2011-12-08 NOTE — Telephone Encounter (Signed)
Ok to refill x 1  

## 2012-02-14 ENCOUNTER — Other Ambulatory Visit: Payer: Self-pay | Admitting: Family Medicine

## 2012-02-14 DIAGNOSIS — Z1231 Encounter for screening mammogram for malignant neoplasm of breast: Secondary | ICD-10-CM

## 2012-03-01 ENCOUNTER — Telehealth: Payer: Self-pay | Admitting: Family Medicine

## 2012-03-01 MED ORDER — ZOLPIDEM TARTRATE 10 MG PO TABS: 10.0000 mg | ORAL_TABLET | Freq: Every evening | ORAL | Status: DC | PRN

## 2012-03-01 NOTE — Telephone Encounter (Signed)
Refill: Zolpidem tartrate 10mg  tablet. Take 1 tablet by mouth at bedtime as needed for sleep. Last fill 12-08-11

## 2012-03-01 NOTE — Telephone Encounter (Signed)
Last seen 04/04/11 and filled 12/08/11 # 30. Please advise    KP

## 2012-03-01 NOTE — Telephone Encounter (Signed)
Refill x1 

## 2012-03-09 ENCOUNTER — Ambulatory Visit
Admission: RE | Admit: 2012-03-09 | Discharge: 2012-03-09 | Disposition: A | Discharge status: Home / Self Care | Payer: Medicare Other | Source: Ambulatory Visit | Attending: Family Medicine | Admitting: Family Medicine

## 2012-03-09 DIAGNOSIS — Z1231 Encounter for screening mammogram for malignant neoplasm of breast: Secondary | ICD-10-CM

## 2012-03-13 ENCOUNTER — Other Ambulatory Visit: Payer: Self-pay | Admitting: Family Medicine

## 2012-03-13 MED ORDER — SIMVASTATIN 40 MG PO TABS: ORAL_TABLET | ORAL | Status: DC

## 2012-03-13 NOTE — Telephone Encounter (Signed)
per [facility] @ [facility] pt needs 90-day supply opf simvastatin Last seen 04/04/11 Last wrt 5.2.13 Simvastatin (Tab) ZOCOR 40 MG Take 1 tablet (40 mg total) by mouth every evening.  30 wt/5-refills

## 2012-05-16 ENCOUNTER — Other Ambulatory Visit: Payer: Self-pay | Admitting: Family Medicine

## 2012-05-16 NOTE — Telephone Encounter (Signed)
OV 04/04/11, labs 09/22/11 and filled 03/01/12 # 30 no refills  Plz advise    MW

## 2012-07-04 HISTORY — PX: EYE SURGERY: SHX253

## 2012-07-04 HISTORY — PX: CATARACT EXTRACTION: SUR2

## 2012-08-08 ENCOUNTER — Telehealth: Payer: Self-pay | Admitting: Family Medicine

## 2012-08-08 NOTE — Telephone Encounter (Signed)
The patient has not been seen in over a year and will need to have an OV.     KP

## 2012-08-08 NOTE — Telephone Encounter (Signed)
Pt has been scheduled for Monday 42m f/u wt/dr lowne  pt will come fasting

## 2012-08-08 NOTE — Telephone Encounter (Signed)
Noted  KP 

## 2012-08-08 NOTE — Telephone Encounter (Signed)
pt called to schedule labs from 09/2011 lab work notes--pt stated she has not been seen as she had a hip replacement-pt scheduled for monday at 8am, please review and add orders --ov note from lab work   [employer] labs in 6 months------272.4  Lipid, hep

## 2012-08-10 ENCOUNTER — Encounter: Payer: Self-pay | Admitting: Lab

## 2012-08-13 ENCOUNTER — Ambulatory Visit (INDEPENDENT_AMBULATORY_CARE_PROVIDER_SITE_OTHER): Payer: Medicare Other | Admitting: Family Medicine

## 2012-08-13 ENCOUNTER — Other Ambulatory Visit: Payer: Medicare Other

## 2012-08-13 ENCOUNTER — Encounter: Payer: Self-pay | Admitting: Family Medicine

## 2012-08-13 VITALS — BP 124/76 | HR 72 | Temp 98.6°F | Wt 155.0 lb

## 2012-08-13 DIAGNOSIS — R739 Hyperglycemia, unspecified: Secondary | ICD-10-CM

## 2012-08-13 DIAGNOSIS — Z0000 Encounter for general adult medical examination without abnormal findings: Secondary | ICD-10-CM

## 2012-08-13 DIAGNOSIS — E785 Hyperlipidemia, unspecified: Secondary | ICD-10-CM

## 2012-08-13 DIAGNOSIS — K219 Gastro-esophageal reflux disease without esophagitis: Secondary | ICD-10-CM

## 2012-08-13 DIAGNOSIS — R7309 Other abnormal glucose: Secondary | ICD-10-CM

## 2012-08-13 LAB — BASIC METABOLIC PANEL
CO2: 29 mEq/L (ref 19–32)
Calcium: 9.2 mg/dL (ref 8.4–10.5)
Chloride: 101 mEq/L (ref 96–112)
Creatinine, Ser: 0.7 mg/dL (ref 0.4–1.2)
Sodium: 139 mEq/L (ref 135–145)

## 2012-08-13 LAB — HEPATIC FUNCTION PANEL
ALT: 35 U/L (ref 0–35)
Alkaline Phosphatase: 77 U/L (ref 39–117)
Bilirubin, Direct: 0 mg/dL (ref 0.0–0.3)
Total Bilirubin: 0.7 mg/dL (ref 0.3–1.2)

## 2012-08-13 LAB — LIPID PANEL
HDL: 64.1 mg/dL (ref >39.00)
Total CHOL/HDL Ratio: 4
Triglycerides: 132 mg/dL (ref 0.0–149.0)

## 2012-08-13 LAB — HEMOGLOBIN A1C: Hgb A1c MFr Bld: 5.8 % (ref 4.6–6.5)

## 2012-08-13 MED ORDER — OMEPRAZOLE 20 MG PO CPDR: 20.0000 mg | DELAYED_RELEASE_CAPSULE | Freq: Every day | ORAL | Status: DC

## 2012-08-13 NOTE — Progress Notes (Signed)
  Subjective:    Joanna Williams is a 72 y.o. female here for follow up of dyslipidemia. The patient does not use medications that may worsen dyslipidemias (corticosteroids, progestins, anabolic steroids, diuretics, beta-blockers, amiodarone, cyclosporine, olanzapine). The patient exercises frequently.  Pt also c/o gerd symptoms and would like prilosec refilled .    The patient is not known to have coexisting coronary artery disease.   Cardiac Risk Factors Age > 45-female, > 55-female:  YES  +1  Smoking:   NO  Sig. family hx of CHD*:  YES  +1  Hypertension:   NO  Diabetes:   NO  HDL < 35:   NO  HDL > 59:   YES  -1  Total: 1   *- Sig. family h/o CHD per NCEP = MI or sudden death at <55yo in  father or other 1st-degree female relative, or <65yo in mother or  other 1st-degree female relative  The following portions of the patient's history were reviewed and updated as appropriate: allergies, current medications, past family history, past medical history, past social history, past surgical history and problem list.  Review of Systems Pertinent items are noted in HPI.    Objective:    BP 124/76  Pulse 72  Temp(Src) 98.6 F (37 C) (Oral)  Wt 155 lb (70.308 kg)  BMI 26.59 kg/m2  SpO2 97% General appearance: alert, cooperative, appears stated age and no distress Lungs: clear to auscultation bilaterally Heart: S1, S2 normal Extremities: extremities normal, atraumatic, no cyanosis or edema  Lab Review Lab Results  Component Value Date   CHOL 213* 09/22/2011   CHOL 230* 04/18/2011   CHOL 309* 01/17/2011   HDL 63.70 09/22/2011   HDL 16.10 04/18/2011   HDL 57.10 01/17/2011   LDLDIRECT 132.2 09/22/2011   LDLDIRECT 143.0 04/18/2011   LDLDIRECT 238.3 01/17/2011      Assessment:    Dyslipidemia as detailed above with 1 CHD risk factors using NCEP scheme above.  Target levels for LDL are: < 100 mg/dl (CHD or "CHD risk equivalent" is present)  Explained to the patient the respective  contributions of genetics, diet, and exercise to lipid levels and the use of medication in severe cases which do not respond to lifestyle alteration. The patient's interest and motivation in making lifestyle changes seems fair.    Plan:    The following changes are planned for the next 3 months, at which time the patient will return for repeat fasting lipids:  1. Dietary changes: Increase soluble fiber Plant sterols 2grams per day (e.g. Benecol): yes Reduce saturated fat, "trans" monounsaturated fatty acids, and cholesterol 2. Exercise changes:  cont current exercise 3. Other treatment: None 4. Lipid-lowering medications: None at present. Will consider if LDL > 130 on recheck.  (Recommended by NCEP after 3-6 mos of dietary therapy & lifestyle modification,  except if CHD is present or LDL well above 190.) 5. Hormone replacement therapy (patient is a postmenopausal  woman): no 6. Screening for secondary causes of dyslipidemias: None indicated 7. Lipid screening for relatives: na 8. Follow up: 3 months.  Note: The majority of the visit was spent in counseling on the pathophysiology and treatment of dyslipidemias. The total face-to-face time was in excess of 25 minutes.

## 2012-08-13 NOTE — Assessment & Plan Note (Signed)
Omeprazole 20 mg 1 po qd  Check labs Consider GI

## 2012-08-13 NOTE — Patient Instructions (Signed)

## 2012-08-14 LAB — POCT URINALYSIS DIPSTICK
Leukocytes, UA: NEGATIVE
Nitrite, UA: NEGATIVE
Protein, UA: NEGATIVE
pH, UA: 8

## 2012-08-23 ENCOUNTER — Other Ambulatory Visit (INDEPENDENT_AMBULATORY_CARE_PROVIDER_SITE_OTHER): Payer: Medicare Other

## 2012-08-23 DIAGNOSIS — E785 Hyperlipidemia, unspecified: Secondary | ICD-10-CM

## 2012-08-23 DIAGNOSIS — K219 Gastro-esophageal reflux disease without esophagitis: Secondary | ICD-10-CM

## 2012-08-31 ENCOUNTER — Encounter: Payer: Self-pay | Admitting: Family Medicine

## 2012-09-12 ENCOUNTER — Telehealth: Payer: Self-pay | Admitting: Family Medicine

## 2012-09-12 MED ORDER — ZOLPIDEM TARTRATE 10 MG PO TABS: ORAL_TABLET | ORAL | Status: DC

## 2012-09-12 NOTE — Telephone Encounter (Signed)
Last seen 08/13/12 and filled 05/16/12 #30. Please advise     KP

## 2012-09-12 NOTE — Telephone Encounter (Signed)
REFILL Zolpidem Tartrate (Tab) 10 MG TAKE 1 TABLET BY MOUTH AT BEDTIME AS NEEDED FOR SLEEP  NO QTY LISTED last fill 11.13.13

## 2012-09-12 NOTE — Telephone Encounter (Signed)
Refill x1   1 refill 

## 2012-09-24 ENCOUNTER — Other Ambulatory Visit: Payer: Self-pay | Admitting: Family Medicine

## 2012-11-16 ENCOUNTER — Telehealth: Payer: Self-pay

## 2012-11-16 NOTE — Telephone Encounter (Signed)
Letter from insurance company advising the risk of the patient taking Ambien 10 mg, due to her age it can cause an increase risk of delirium and falls in individuals over 27. Per Dr.Lowne decrease to 5 mg. Msg left to make the patient aware.      KP

## 2012-11-19 NOTE — Telephone Encounter (Signed)
Pt notified and advised that she does not take a whole [employer], states she has only been taking 1/2 when she needs it.

## 2012-11-19 NOTE — Telephone Encounter (Signed)
Patient returned our call. CB# [phone]

## 2012-11-20 NOTE — Telephone Encounter (Signed)
Rx updated on the medication list.      KP

## 2013-02-06 ENCOUNTER — Other Ambulatory Visit: Payer: Self-pay

## 2013-02-07 ENCOUNTER — Other Ambulatory Visit: Payer: Self-pay

## 2013-02-07 DIAGNOSIS — Z1231 Encounter for screening mammogram for malignant neoplasm of breast: Secondary | ICD-10-CM

## 2013-02-11 ENCOUNTER — Telehealth: Payer: Self-pay | Admitting: *Deleted

## 2013-02-11 NOTE — Telephone Encounter (Signed)
Pharmacy is requesting a refill for zolpidem 10 mg.  Last ov 08/13/12 last date filled 11/20/12 #30 0-R Last UDS 08/13/12 patient has contract on file patient is low risk.  Please Advise.  Ag cma

## 2013-02-11 NOTE — Telephone Encounter (Signed)
Ok to refill 

## 2013-02-12 ENCOUNTER — Other Ambulatory Visit: Payer: Self-pay | Admitting: *Deleted

## 2013-02-12 MED ORDER — ZOLPIDEM TARTRATE 5 MG PO TABS: 5.0000 mg | ORAL_TABLET | Freq: Every evening | ORAL | Status: DC | PRN

## 2013-02-12 NOTE — Telephone Encounter (Signed)
Rx for Zolpidem has been printed and waiting to signed and faxed. AG cma

## 2013-03-11 ENCOUNTER — Ambulatory Visit
Admission: RE | Admit: 2013-03-11 | Discharge: 2013-03-11 | Disposition: A | Discharge status: Home / Self Care | Payer: Medicare Other | Source: Ambulatory Visit

## 2013-03-11 DIAGNOSIS — Z1231 Encounter for screening mammogram for malignant neoplasm of breast: Secondary | ICD-10-CM

## 2013-05-06 ENCOUNTER — Other Ambulatory Visit: Payer: Self-pay | Admitting: Family Medicine

## 2013-06-05 ENCOUNTER — Other Ambulatory Visit: Payer: Self-pay | Admitting: Family Medicine

## 2013-06-05 DIAGNOSIS — G4700 Insomnia, unspecified: Secondary | ICD-10-CM

## 2013-06-05 MED ORDER — ZOLPIDEM TARTRATE 5 MG PO TABS: 5.0000 mg | ORAL_TABLET | Freq: Every evening | ORAL | Status: DC | PRN

## 2013-06-05 NOTE — Telephone Encounter (Signed)
Last seen 08/13/12 and filled 02/12/13 #30. Please advise       KP

## 2013-06-08 ENCOUNTER — Other Ambulatory Visit: Payer: Self-pay | Admitting: Family Medicine

## 2013-07-03 ENCOUNTER — Encounter: Payer: Medicare Other | Admitting: Family Medicine

## 2013-07-22 ENCOUNTER — Other Ambulatory Visit: Payer: Self-pay | Admitting: Family Medicine

## 2013-08-05 ENCOUNTER — Telehealth: Payer: Self-pay | Admitting: *Deleted

## 2013-08-05 ENCOUNTER — Telehealth: Payer: Self-pay

## 2013-08-05 MED ORDER — SIMVASTATIN 40 MG PO TABS: ORAL_TABLET | ORAL | Status: DC

## 2013-08-05 NOTE — Telephone Encounter (Signed)
CPE is pending. Rx sent     KP

## 2013-08-05 NOTE — Telephone Encounter (Signed)
Patient called and requested a refill for simvastatin (ZOCOR) 40 MG tablet, patient is scheduled to come in on March 2 for her cpe   [facility] [place] [place] [facility] [place]

## 2013-08-05 NOTE — Telephone Encounter (Signed)
Pt. requesting medication refill for Simvastatin 40 mg 1 tab PO daily.   Last Visit- 08/13/12 Labs- 08/13/12 Physical set for 09/02/13.  Spoke with patient about coming in for a medication follow-up.  Appt. set for 08/08/13 at 0915.  Patient encouraged to fast for labs.

## 2013-08-08 ENCOUNTER — Ambulatory Visit: Payer: Medicare Other | Admitting: Family Medicine

## 2013-08-16 ENCOUNTER — Other Ambulatory Visit: Payer: Self-pay

## 2013-08-16 DIAGNOSIS — K219 Gastro-esophageal reflux disease without esophagitis: Secondary | ICD-10-CM

## 2013-08-16 MED ORDER — OMEPRAZOLE 20 MG PO CPDR: 20.0000 mg | DELAYED_RELEASE_CAPSULE | Freq: Every day | ORAL | Status: DC

## 2013-09-02 ENCOUNTER — Encounter: Payer: Self-pay | Admitting: Family Medicine

## 2013-09-02 ENCOUNTER — Telehealth: Payer: Self-pay

## 2013-09-02 ENCOUNTER — Ambulatory Visit (INDEPENDENT_AMBULATORY_CARE_PROVIDER_SITE_OTHER): Payer: Medicare HMO | Admitting: Family Medicine

## 2013-09-02 VITALS — BP 120/80 | HR 73 | Temp 98.2°F | Ht 64.0 in | Wt 158.8 lb

## 2013-09-02 DIAGNOSIS — M79609 Pain in unspecified limb: Secondary | ICD-10-CM

## 2013-09-02 DIAGNOSIS — M79641 Pain in right hand: Secondary | ICD-10-CM

## 2013-09-02 DIAGNOSIS — M7989 Other specified soft tissue disorders: Secondary | ICD-10-CM

## 2013-09-02 DIAGNOSIS — K219 Gastro-esophageal reflux disease without esophagitis: Secondary | ICD-10-CM

## 2013-09-02 DIAGNOSIS — M79642 Pain in left hand: Secondary | ICD-10-CM

## 2013-09-02 DIAGNOSIS — G4700 Insomnia, unspecified: Secondary | ICD-10-CM

## 2013-09-02 DIAGNOSIS — Z23 Encounter for immunization: Secondary | ICD-10-CM

## 2013-09-02 DIAGNOSIS — Z0000 Encounter for general adult medical examination without abnormal findings: Secondary | ICD-10-CM

## 2013-09-02 DIAGNOSIS — E785 Hyperlipidemia, unspecified: Secondary | ICD-10-CM

## 2013-09-02 LAB — CBC WITH DIFFERENTIAL/PLATELET
BASOS PCT: 0.7 % (ref 0.0–3.0)
Basophils Absolute: 0 10*3/uL (ref 0.0–0.1)
EOS ABS: 0.2 10*3/uL (ref 0.0–0.7)
Eosinophils Relative: 3.4 % (ref 0.0–5.0)
HEMATOCRIT: 43 % (ref 36.0–46.0)
HEMOGLOBIN: 14.4 g/dL (ref 12.0–15.0)
LYMPHS ABS: 2.1 10*3/uL (ref 0.7–4.0)
Lymphocytes Relative: 29.7 % (ref 12.0–46.0)
MCHC: 33.4 g/dL (ref 30.0–36.0)
MCV: 93.6 fl (ref 78.0–100.0)
MONO ABS: 0.6 10*3/uL (ref 0.1–1.0)
Monocytes Relative: 8.3 % (ref 3.0–12.0)
NEUTROS ABS: 4 10*3/uL (ref 1.4–7.7)
NEUTROS PCT: 57.9 % (ref 43.0–77.0)
Platelets: 265 10*3/uL (ref 150.0–400.0)
RBC: 4.6 Mil/uL (ref 3.87–5.11)
RDW: 13.3 % (ref 11.5–14.6)
WBC: 6.9 10*3/uL (ref 4.5–10.5)

## 2013-09-02 LAB — BASIC METABOLIC PANEL
BUN: 15 mg/dL (ref 6–23)
CHLORIDE: 104 meq/L (ref 96–112)
CO2: 28 meq/L (ref 19–32)
CREATININE: 0.7 mg/dL (ref 0.4–1.2)
Calcium: 9.2 mg/dL (ref 8.4–10.5)
GFR: 87.01 mL/min (ref >60.00)
Glucose, Bld: 80 mg/dL (ref 70–99)
POTASSIUM: 3.9 meq/L (ref 3.5–5.1)
Sodium: 140 mEq/L (ref 135–145)

## 2013-09-02 LAB — LIPID PANEL
CHOLESTEROL: 217 mg/dL — AB (ref 0–200)
HDL: 59.9 mg/dL (ref >39.00)
LDL Cholesterol: 125 mg/dL — ABNORMAL HIGH (ref 0–99)
TRIGLYCERIDES: 161 mg/dL — AB (ref 0.0–149.0)
Total CHOL/HDL Ratio: 4
VLDL: 32.2 mg/dL (ref 0.0–40.0)

## 2013-09-02 LAB — HEPATIC FUNCTION PANEL
ALBUMIN: 4.1 g/dL (ref 3.5–5.2)
ALT: 17 U/L (ref 0–35)
AST: 22 U/L (ref 0–37)
Alkaline Phosphatase: 73 U/L (ref 39–117)
Bilirubin, Direct: 0.1 mg/dL (ref 0.0–0.3)
TOTAL PROTEIN: 7 g/dL (ref 6.0–8.3)
Total Bilirubin: 0.6 mg/dL (ref 0.3–1.2)

## 2013-09-02 LAB — RHEUMATOID FACTOR: Rhuematoid fact SerPl-aCnc: <10 IU/mL (ref <=14)

## 2013-09-02 MED ORDER — ZOLPIDEM TARTRATE 5 MG PO TABS: 5.0000 mg | ORAL_TABLET | Freq: Every evening | ORAL | Status: DC | PRN

## 2013-09-02 MED ORDER — SIMVASTATIN 40 MG PO TABS: ORAL_TABLET | ORAL | Status: DC

## 2013-09-02 MED ORDER — OMEPRAZOLE 20 MG PO CPDR: 20.0000 mg | DELAYED_RELEASE_CAPSULE | Freq: Every day | ORAL | Status: DC

## 2013-09-02 NOTE — Progress Notes (Signed)
Subjective:    Joanna Williams is a 73 y.o. female who presents for Medicare Annual/Subsequent preventive examination.  Preventive Screening-Counseling & Management  Tobacco History  Smoking status  . Never Smoker   Smokeless tobacco  . Never Used     Problems Prior to Visit 1. Hands swelling and pain in joints  Current Problems (verified) Patient Active Problem List   Diagnosis Date Noted  . GERD (gastroesophageal reflux disease) 08/13/2012  . HYPOTHYROIDISM 06/15/2010  . HIP PAIN, RIGHT 05/10/2010  . MOLE 11/27/2009  . DYSURIA 07/22/2009  . CANDIDIASIS, SKIN 04/28/2009  . UTI 01/28/2009  . ACUTE PHARYNGITIS 09/04/2008  . CHEST PAIN UNSPECIFIED 08/14/2008  . ABDOMINAL PAIN OTHER SPECIFIED SITE 08/14/2008  . HYPERLIPIDEMIA 02/13/2007    Medications Prior to Visit No current outpatient prescriptions on file prior to visit.   No current facility-administered medications on file prior to visit.    Current Medications (verified) Current Outpatient Prescriptions  Medication Sig Dispense Refill  . omeprazole (PRILOSEC) 20 MG capsule Take 1 capsule (20 mg total) by mouth daily.  90 capsule  3  . simvastatin (ZOCOR) 40 MG tablet TAKE 1 TABLET BY MOUTH DAILY -Keep appointment  90 tablet  3  . zolpidem (AMBIEN) 5 MG tablet Take 1 tablet (5 mg total) by mouth at bedtime as needed for sleep.  90 tablet  0   No current facility-administered medications for this visit.     Allergies (verified) Niacin   PAST HISTORY  Family History Family History  Problem Relation Age of Onset  . Heart disease Mother   . Cancer Father 24    brain    Social History History  Substance Use Topics  . Smoking status: Never Smoker   . Smokeless tobacco: Never Used  . Alcohol Use: Yes     Are there smokers in your home (other than you)? No  Risk Factors Current exercise habits: yoga and walking   Dietary issues discussed: na   Cardiac risk factors: advanced age (older than 93  for men, 11 for women) and dyslipidemia.  Depression Screen (Note: if answer to either of the following is "Yes", a more complete depression screening is indicated)   Over the past two weeks, have you felt down, depressed or hopeless? No  Over the past two weeks, have you felt little interest or pleasure in doing things? No  Have you lost interest or pleasure in daily life? No  Do you often feel hopeless? No  Do you cry easily over simple problems? No  Activities of Daily Living In your present state of health, do you have any difficulty performing the following activities?:  Driving? No Managing money?  No Feeding yourself? No Getting from bed to chair? No Climbing a flight of stairs? No Preparing food and eating?: No Bathing or showering? No Getting dressed: No Getting to the toilet? No Using the toilet:No Moving around from place to place: No In the past year have you fallen or had a near fall?:No   Are you sexually active?  Yes  Do you have more than one partner?  No  Hearing Difficulties: No Do you often ask people to speak up or repeat themselves? No Do you experience ringing or noises in your ears? No Do you have difficulty understanding soft or whispered voices? No   Do you feel that you have a problem with memory? No  Do you often misplace items? No  Do you feel safe at home?  Yes  Cognitive Testing  Alert? Yes  Normal Appearance?Yes  Oriented to person? Yes  Place? Yes   Time? Yes  Recall of three objects?  Yes  Can perform simple calculations? Yes  Displays appropriate judgment?Yes  Can read the correct time from a watch face?Yes   Advanced Directives have been discussed with the patient? Yes  List the Names of Other Physician/Practitioners you currently use: 1.  opth-Jicha 2.  Dentist-- [place] 3.  Cataract-- beavis 4.ortho-- Landis Gandy any recent Medical Services you may have received from other than [facility] providers in the past year (date may be  approximate).  Immunization History  Administered Date(s) Administered  . Influenza Split 04/18/2011  . Influenza Whole 04/18/2007, 04/15/2008  . Zoster 06/25/2008    Screening Tests Health Maintenance  Topic Date Due  . Tetanus/tdap  12/24/1958  . Pneumococcal Polysaccharide Vaccine Age 26 And Over  12/23/2004  . Influenza Vaccine  02/01/2014  . Mammogram  03/11/2014  . Colonoscopy  09/03/2018  . Zostavax  Completed    All answers were reviewed with the patient and necessary referrals were made:  Garnet Koyanagi, DO   09/02/2013   History reviewed:  She  has a past medical history of Hyperlipidemia. She  does not have any pertinent problems on file. She  has past surgical history that includes Colonoscopy (2 yrs ago) and Total hip arthroplasty (08/10/2011). Her family history includes Cancer (age of onset: 45) in her father; Heart disease in her mother. She  reports that she has never smoked. She has never used smokeless tobacco. She reports that she drinks alcohol. She reports that she uses illicit drugs (Hydrocodone). She has a current medication list which includes the following prescription(s): omeprazole, simvastatin, and zolpidem. No current outpatient prescriptions on file prior to visit.   No current facility-administered medications on file prior to visit.   She is allergic to niacin.  Review of Systems  Review of Systems  Constitutional: Negative for activity change, appetite change and fatigue.  HENT: Negative for hearing loss, congestion, tinnitus and ear discharge.   Eyes: Negative for visual disturbance (see optho q1y -- vision corrected to 20/20 with glasses).  Respiratory: Negative for cough, chest tightness and shortness of breath.   Cardiovascular: Negative for chest pain, palpitations and leg swelling.  Gastrointestinal: Negative for abdominal pain, diarrhea, constipation and abdominal distention.  Genitourinary: Negative for urgency, frequency, decreased  urine volume and difficulty urinating.  Musculoskeletal: Negative for back pain, arthralgias and gait problem.  Skin: Negative for color change, pallor and rash.  Neurological: Negative for dizziness, light-headedness, numbness and headaches.  Hematological: Negative for adenopathy. Does not bruise/bleed easily.  Psychiatric/Behavioral: Negative for suicidal ideas, confusion, sleep disturbance, self-injury, dysphoric mood, decreased concentration and agitation.  Pt is able to read and write and can do all ADLs No risk for falling No abuse/ violence in home      Objective:     Vision by Snellen chart: opth Body mass index is 27.24 kg/(m^2). BP 120/80  Pulse 73  Temp(Src) 98.2 F (36.8 C) (Oral)  Ht 5\' 4"  (1.626 m)  Wt 158 lb 12.8 oz (72.031 kg)  BMI 27.24 kg/m2  SpO2 95%  BP 120/80  Pulse 73  Temp(Src) 98.2 F (36.8 C) (Oral)  Ht 5\' 4"  (1.626 m)  Wt 158 lb 12.8 oz (72.031 kg)  BMI 27.24 kg/m2  SpO2 95% General appearance: alert, cooperative, appears stated age and no distress Head: Normocephalic, without obvious abnormality, atraumatic Eyes: conjunctivae/corneas clear. PERRL, EOM's  intact. Fundi benign. Ears: normal TM's and external ear canals both ears Nose: Nares normal. Septum midline. Mucosa normal. No drainage or sinus tenderness. Throat: lips, mucosa, and tongue normal; teeth and gums normal Neck: no adenopathy, no carotid bruit, no JVD, supple, symmetrical, trachea midline and thyroid not enlarged, symmetric, no tenderness/mass/nodules Back: negative Lungs: clear to auscultation bilaterally Breasts: normal appearance, no masses or tenderness, No axillary or supraclavicular adenopathy Heart: S1, S2 normal Abdomen: soft, non-tender; bowel sounds normal; no masses,  no organomegaly Pelvic: not indicated; post-menopausal, no abnormal Pap smears in past Extremities: extremities normal, atraumatic, no cyanosis or edema Pulses: 2+ and symmetric Skin: Skin color,  texture, turgor normal. No rashes or lesions Lymph nodes: Cervical, supraclavicular, and axillary nodes normal. Neurologic: Grossly normal Psych-- no depression, no anxiety      Assessment:     cpe      Plan:     During the course of the visit the patient was educated and counseled about appropriate screening and preventive services including:    Pneumococcal vaccine   Influenza vaccine  Td vaccine  Screening electrocardiogram  Screening mammography  Bone densitometry screening  Colorectal cancer screening  Glaucoma screening  Advanced directives: has an advanced directive - a copy HAS NOT been provided.  Diet review for nutrition referral? Yes ____  Not Indicated ____   Patient Instructions (the written plan) was given to the patient.  Medicare Attestation I have personally reviewed: The patient's medical and social history Their use of alcohol, tobacco or illicit drugs Their current medications and supplements The patient's functional ability including ADLs,fall risks, home safety risks, cognitive, and hearing and visual impairment Diet and physical activities Evidence for depression or mood disorders  The patient's weight, height, BMI, and visual acuity have been recorded in the chart.  I have made referrals, counseling, and provided education to the patient based on review of the above and I have provided the patient with a written personalized care plan for preventive services.    1. Need for prophylactic vaccination against Streptococcus pneumoniae (pneumococcus)  - Pneumococcal polysaccharide vaccine 23-valent greater than or equal to 2yo subcutaneous/IM  2. Bilateral hand pain Probably arthritis Tylenol arthritis - Basic metabolic panel - CBC with Differential - Hepatic function panel - Lipid panel - Rheumatoid Factor - Antinuclear Antib (ANA)  3. Hand swelling  - Antinuclear Antib (ANA)  4. Insomnia Rare use ambien - zolpidem (AMBIEN) 5  MG tablet; Take 1 tablet (5 mg total) by mouth at bedtime as needed for sleep.  Dispense: 90 tablet; Refill: 0  5. GERD (gastroesophageal reflux disease) con't med stable - omeprazole (PRILOSEC) 20 MG capsule; Take 1 capsule (20 mg total) by mouth daily.  Dispense: 90 capsule; Refill: 3  6. Other and unspecified hyperlipidemia Check labs - simvastatin (ZOCOR) 40 MG tablet; TAKE 1 TABLET BY MOUTH DAILY -Keep appointment  Dispense: 90 tablet; Refill: 3  7. Medicare annual wellness visit, subsequent   8. Need for pneumococcal vaccination    Garnet Koyanagi, DO   09/02/2013

## 2013-09-02 NOTE — Telephone Encounter (Signed)
Medication and allergies:  Reviewed and updated  90 day supply/mail order: n/a Local pharmacy:  [facility] [address]    Immunizations due:  Tdap and PNA   A/P: No changes to personal, family history or past surgical hx PAP- no longer receives [facility]- approximately 74yrs ago according to patient.  Negative. MMG- 03/11/13- negative Flu- 04/15/13 Tdap-Due PNA- Due Shingles- 06/25/08  To Discuss with Provider: Experiencing tingling in her hands.

## 2013-09-02 NOTE — Progress Notes (Signed)
Pre visit review using our clinic review tool, if applicable. No additional management support is needed unless otherwise documented below in the visit note. 

## 2013-09-02 NOTE — Patient Instructions (Signed)

## 2013-09-02 NOTE — Telephone Encounter (Signed)
Opened in error

## 2013-09-03 LAB — ANA: Anti Nuclear Antibody(ANA): NEGATIVE

## 2013-09-04 ENCOUNTER — Other Ambulatory Visit: Payer: Self-pay | Admitting: Family Medicine

## 2013-09-04 ENCOUNTER — Encounter: Payer: Self-pay | Admitting: *Deleted

## 2013-09-04 ENCOUNTER — Telehealth: Payer: Self-pay | Admitting: *Deleted

## 2013-09-04 DIAGNOSIS — E785 Hyperlipidemia, unspecified: Secondary | ICD-10-CM

## 2013-09-04 NOTE — Telephone Encounter (Signed)
Spoke with pts husband, left message for pt to return our call. Letter mailed with lab results.

## 2013-09-04 NOTE — Progress Notes (Signed)
Future orders placed for labs. Patient to repeat in 3 months.

## 2013-09-04 NOTE — Telephone Encounter (Signed)
Message copied by Chilton Greathouse on Wed Sep 04, 2013  4:05 PM ------      Message from: Rosalita Chessman      Created: Mon Sep 02, 2013  9:59 PM       Cholesterol--- LDL goal < 100,  HDL >40,  TG < 150.  Diet and exercise will increase HDL and decrease LDL and TG.  Fish,  Fish Oil, Flaxseed oil will also help increase the HDL and decrease Triglycerides.  Add fenofibrate 160 mg #30  1 a day, 2 refills Recheck labs in 3 months...   272,4  Lipid, hep, bmp.             ------

## 2013-09-05 ENCOUNTER — Encounter: Payer: Self-pay | Admitting: *Deleted

## 2013-09-05 NOTE — Progress Notes (Signed)
Letter sent with lab results.  

## 2013-12-24 ENCOUNTER — Encounter: Payer: Self-pay | Admitting: Gastroenterology

## 2014-02-04 ENCOUNTER — Ambulatory Visit (INDEPENDENT_AMBULATORY_CARE_PROVIDER_SITE_OTHER): Payer: Commercial Managed Care - HMO | Admitting: Medical

## 2014-02-04 ENCOUNTER — Encounter: Payer: Self-pay | Admitting: Medical

## 2014-02-04 VITALS — BP 132/78 | HR 78 | Temp 97.6°F | Resp 16 | Wt 155.4 lb

## 2014-02-04 DIAGNOSIS — M5432 Sciatica, left side: Secondary | ICD-10-CM

## 2014-02-04 DIAGNOSIS — M5430 Sciatica, unspecified side: Secondary | ICD-10-CM

## 2014-02-04 MED ORDER — CYCLOBENZAPRINE HCL 5 MG PO TABS: 5.0000 mg | ORAL_TABLET | Freq: Every day | ORAL | Status: DC

## 2014-02-04 MED ORDER — DICLOFENAC SODIUM 75 MG PO TBEC: 75.0000 mg | DELAYED_RELEASE_TABLET | Freq: Two times a day (BID) | ORAL | Status: DC

## 2014-02-04 NOTE — Assessment & Plan Note (Addendum)
Diclofenac rx and cyclobenzaprine. Conservative measures and gave handout. Folllow up in 7 days or prn. If in near future pain persisting, changing or worsening then would get xrays.

## 2014-02-04 NOTE — Progress Notes (Signed)
Subjective:    Patient ID: Joanna Williams, female    DOB: Sep 02, 1940, 73 y.o.   MRN: [id number]  HPI  Pt in with some lower back pain. Pain for 5 days. Today pain better. Hx of pain on and off in the past. No trauma or injury. Self limited pain in the past. Pain  lower back and lt sciatica. No pain radiating to legs. No weakness. No incontinence. Pt just took ibuprofen for pain. Pain about 5/10.   Past Medical History  Diagnosis Date  . Hyperlipidemia     History   Social History  . Marital Status: Married    Spouse Name: N/A    Number of Children: N/A  . Years of Education: N/A   Occupational History  . retired    Social History Main Topics  . Smoking status: Never Smoker   . Smokeless tobacco: Never Used  . Alcohol Use: Yes  . Drug Use: Yes    Special: Hydrocodone  . Sexual Activity: Yes    Partners: Male   Other Topics Concern  . Not on file   Social History Narrative   Walking, yoga    Past Surgical History  Procedure Laterality Date  . Colonoscopy  2 yrs ago  . Total hip arthroplasty  08/10/2011    Procedure: TOTAL HIP ARTHROPLASTY;  Surgeon: Kerin Salen, MD;  Location: [place];  Service: Orthopedics;  Laterality: Right;  DEPUY/ PENNACLE POLY OR CERAMIC    Family History  Problem Relation Age of Onset  . Heart disease Mother   . Cancer Father 72    brain    Allergies  Allergen Reactions  . Niacin     REACTION: Nausea    Current Outpatient Prescriptions on File Prior to Visit  Medication Sig Dispense Refill  . omeprazole (PRILOSEC) 20 MG capsule Take 1 capsule (20 mg total) by mouth daily.  90 capsule  3  . simvastatin (ZOCOR) 40 MG tablet TAKE 1 TABLET BY MOUTH DAILY -Keep appointment  90 tablet  3  . zolpidem (AMBIEN) 5 MG tablet Take 1 tablet (5 mg total) by mouth at bedtime as needed for sleep.  90 tablet  0   No current facility-administered medications on file prior to visit.    BP 132/78  Pulse 78  Temp(Src) 97.6 F (36.4 C)  (Oral)  Resp 16  Wt 155 lb 6 oz (70.478 kg)  SpO2 95%    Review of Systems  Constitutional: Negative for fever and fatigue.  Respiratory: Negative for choking, chest tightness and shortness of breath.   Cardiovascular: Negative for chest pain and palpitations.  Endocrine: Negative for polydipsia, polyphagia and polyuria.  Genitourinary: Negative for dysuria, frequency, flank pain and pelvic pain.  Musculoskeletal: Positive for back pain and myalgias. Negative for gait problem and neck pain.       Pain in sciatic region lt side.  Neurological: Negative for weakness and numbness.  Hematological: Negative for adenopathy. Does not bruise/bleed easily.  Psychiatric/Behavioral: Negative.        Objective:   Physical Exam  Constitutional: She is oriented to person, place, and time. She appears well-developed and well-nourished. No distress.  HENT:  Head: Normocephalic and atraumatic.  Neck: Normal range of motion. Neck supple.  Cardiovascular: Normal rate, regular rhythm and normal heart sounds.   Pulmonary/Chest: Effort normal and breath sounds normal. No respiratory distress. She has no wheezes. She has no rales. She exhibits no tenderness.  Abdominal: Soft. Bowel sounds are normal.  She exhibits no distension and no mass. There is no tenderness. There is no rebound and no guarding.  Musculoskeletal:  No mid lumbar tenderness to palpation. Lt si tenderness to palpation. Pain mild on straight leg lift.  Neurological: She is alert and oriented to person, place, and time. No cranial nerve deficit. Coordination normal.  L5-S1 sensation intact. Normal patellar reflexes. No foot drop. Equal and symmetric strength bilaterally 5/5.  Skin: Skin is dry. She is not diaphoretic.    .       Assessment & Plan:

## 2014-02-04 NOTE — Patient Instructions (Addendum)
You appear to have sciatica. I will send diclofenac(nsaid)  to your pharmacy along with cyclobenzaprine(muslce relaxant). Conservative measures for sciatica. If pain persists or worsens past 7 days return. If worsens come sooner. Stop otc nsaids.  Sciatica Sciatica is pain, weakness, numbness, or tingling along the path of the sciatic nerve. The nerve starts in the lower back and runs down the back of each leg. The nerve controls the muscles in the lower leg and in the back of the knee, while also providing sensation to the back of the thigh, lower leg, and the sole of your foot. Sciatica is a symptom of another medical condition. For instance, nerve damage or certain conditions, such as a herniated disk or bone spur on the spine, pinch or put pressure on the sciatic nerve. This causes the pain, weakness, or other sensations normally associated with sciatica. Generally, sciatica only affects one side of the body. CAUSES   Herniated or slipped disc.  Degenerative disk disease.  A pain disorder involving the narrow muscle in the buttocks (piriformis syndrome).  Pelvic injury or fracture.  Pregnancy.  Tumor (rare). SYMPTOMS  Symptoms can vary from mild to very severe. The symptoms usually travel from the low back to the buttocks and down the back of the leg. Symptoms can include:  Mild tingling or dull aches in the lower back, leg, or hip.  Numbness in the back of the calf or sole of the foot.  Burning sensations in the lower back, leg, or hip.  Sharp pains in the lower back, leg, or hip.  Leg weakness.  Severe back pain inhibiting movement. These symptoms may get worse with coughing, sneezing, laughing, or prolonged sitting or standing. Also, being overweight may worsen symptoms. DIAGNOSIS  Your caregiver will perform a physical exam to look for common symptoms of sciatica. He or she may ask you to do certain movements or activities that would trigger sciatic nerve pain. Other tests  may be performed to find the cause of the sciatica. These may include:  Blood tests.  X-rays.  Imaging tests, such as an MRI or CT scan. TREATMENT  Treatment is directed at the cause of the sciatic pain. Sometimes, treatment is not necessary and the pain and discomfort goes away on its own. If treatment is needed, your caregiver may suggest:  Over-the-counter medicines to relieve pain.  Prescription medicines, such as anti-inflammatory medicine, muscle relaxants, or narcotics.  Applying heat or ice to the painful area.  Steroid injections to lessen pain, irritation, and inflammation around the nerve.  Reducing activity during periods of pain.  Exercising and stretching to strengthen your abdomen and improve flexibility of your spine. Your caregiver may suggest losing weight if the extra weight makes the back pain worse.  Physical therapy.  Surgery to eliminate what is pressing or pinching the nerve, such as a bone spur or part of a herniated disk. HOME CARE INSTRUCTIONS   Only take over-the-counter or prescription medicines for pain or discomfort as directed by your caregiver.  Apply ice to the affected area for 20 minutes, 3-4 times a day for the first 48-72 hours. Then try heat in the same way.  Exercise, stretch, or perform your usual activities if these do not aggravate your pain.  Attend physical therapy sessions as directed by your caregiver.  Keep all follow-up appointments as directed by your caregiver.  Do not wear high heels or shoes that do not provide proper support.  Check your mattress to see if it is  too soft. A firm mattress may lessen your pain and discomfort. SEEK IMMEDIATE MEDICAL CARE IF:   You lose control of your bowel or bladder (incontinence).  You have increasing weakness in the lower back, pelvis, buttocks, or legs.  You have redness or swelling of your back.  You have a burning sensation when you urinate.  You have pain that gets worse when  you lie down or awakens you at night.  Your pain is worse than you have experienced in the past.  Your pain is lasting longer than 4 weeks.  You are suddenly losing weight without reason. MAKE SURE YOU:  Understand these instructions.  Will watch your condition.  Will get help right away if you are not doing well or get worse. Document Released: 06/14/2001 Document Revised: 12/20/2011 Document Reviewed: 10/30/2011 [facility] Patient Information 2015 [place], [place]. This information is not intended to replace advice given to you by your health care provider. Make sure you discuss any questions you have with your health care provider.

## 2014-02-04 NOTE — Progress Notes (Signed)
Pre visit review using our clinic review tool, if applicable. No additional management support is needed unless otherwise documented below in the visit note. 

## 2014-02-20 ENCOUNTER — Other Ambulatory Visit: Payer: Self-pay | Admitting: Family Medicine

## 2014-02-20 NOTE — Telephone Encounter (Signed)
Last seen and filled 09/02/13 #90. Please advise    KP

## 2014-03-01 ENCOUNTER — Other Ambulatory Visit: Payer: Self-pay | Admitting: Family Medicine

## 2014-03-03 NOTE — Telephone Encounter (Signed)
Rx sent 02/20/14.    KP

## 2014-03-05 ENCOUNTER — Ambulatory Visit (HOSPITAL_BASED_OUTPATIENT_CLINIC_OR_DEPARTMENT_OTHER)
Admission: RE | Admit: 2014-03-05 | Discharge: 2014-03-05 | Disposition: A | Discharge status: Home / Self Care | Payer: Medicare HMO | Source: Ambulatory Visit | Attending: Family Medicine | Admitting: Family Medicine

## 2014-03-05 ENCOUNTER — Encounter: Payer: Self-pay | Admitting: Family Medicine

## 2014-03-05 ENCOUNTER — Ambulatory Visit (INDEPENDENT_AMBULATORY_CARE_PROVIDER_SITE_OTHER): Payer: Commercial Managed Care - HMO | Admitting: Family Medicine

## 2014-03-05 VITALS — BP 110/74 | HR 72 | Temp 97.8°F | Wt 155.2 lb

## 2014-03-05 DIAGNOSIS — M545 Low back pain, unspecified: Secondary | ICD-10-CM | POA: Insufficient documentation

## 2014-03-05 DIAGNOSIS — E785 Hyperlipidemia, unspecified: Secondary | ICD-10-CM

## 2014-03-05 DIAGNOSIS — K219 Gastro-esophageal reflux disease without esophagitis: Secondary | ICD-10-CM

## 2014-03-05 DIAGNOSIS — G4700 Insomnia, unspecified: Secondary | ICD-10-CM

## 2014-03-05 DIAGNOSIS — Z1239 Encounter for other screening for malignant neoplasm of breast: Secondary | ICD-10-CM

## 2014-03-05 LAB — LIPID PANEL
CHOLESTEROL: 208 mg/dL — AB (ref 0–200)
HDL: 50.9 mg/dL (ref >39.00)
LDL CALC: 125 mg/dL — AB (ref 0–99)
NONHDL: 157.1
Total CHOL/HDL Ratio: 4
Triglycerides: 163 mg/dL — ABNORMAL HIGH (ref 0.0–149.0)
VLDL: 32.6 mg/dL (ref 0.0–40.0)

## 2014-03-05 LAB — BASIC METABOLIC PANEL
BUN: 21 mg/dL (ref 6–23)
CHLORIDE: 104 meq/L (ref 96–112)
CO2: 26 mEq/L (ref 19–32)
Calcium: 9.4 mg/dL (ref 8.4–10.5)
Creatinine, Ser: 0.7 mg/dL (ref 0.4–1.2)
GFR: 84.11 mL/min (ref >60.00)
GLUCOSE: 85 mg/dL (ref 70–99)
POTASSIUM: 4.2 meq/L (ref 3.5–5.1)
SODIUM: 140 meq/L (ref 135–145)

## 2014-03-05 LAB — HEPATIC FUNCTION PANEL
ALBUMIN: 4.1 g/dL (ref 3.5–5.2)
ALK PHOS: 68 U/L (ref 39–117)
ALT: 56 U/L — AB (ref 0–35)
AST: 39 U/L — AB (ref 0–37)
Bilirubin, Direct: 0 mg/dL (ref 0.0–0.3)
Total Bilirubin: 0.7 mg/dL (ref 0.2–1.2)
Total Protein: 7 g/dL (ref 6.0–8.3)

## 2014-03-05 MED ORDER — MELOXICAM 15 MG PO TABS: 15.0000 mg | ORAL_TABLET | Freq: Every day | ORAL | Status: DC

## 2014-03-05 MED ORDER — ZOLPIDEM TARTRATE 5 MG PO TABS: ORAL_TABLET | ORAL | Status: DC

## 2014-03-05 MED ORDER — TIZANIDINE HCL 4 MG PO TABS: 4.0000 mg | ORAL_TABLET | Freq: Four times a day (QID) | ORAL | Status: DC | PRN

## 2014-03-05 MED ORDER — OMEPRAZOLE 20 MG PO CPDR: 20.0000 mg | DELAYED_RELEASE_CAPSULE | Freq: Every day | ORAL | Status: DC

## 2014-03-05 NOTE — Patient Instructions (Signed)
Back Pain, Adult Low back pain is very common. About 1 in 5 people have back pain.The cause of low back pain is rarely dangerous. The pain often gets better over time.About half of people with a sudden onset of back pain feel better in just 2 weeks. About 8 in 10 people feel better by 6 weeks.  CAUSES Some common causes of back pain include:  Strain of the muscles or ligaments supporting the spine.  Wear and tear (degeneration) of the spinal discs.  Arthritis.  Direct injury to the back. DIAGNOSIS Most of the time, the direct cause of low back pain is not known.However, back pain can be treated effectively even when the exact cause of the pain is unknown.Answering your caregiver's questions about your overall health and symptoms is one of the most accurate ways to make sure the cause of your pain is not dangerous. If your caregiver needs more information, he or she may order lab work or imaging tests (X-rays or MRIs).However, even if imaging tests show changes in your back, this usually does not require surgery. HOME CARE INSTRUCTIONS For many people, back pain returns.Since low back pain is rarely dangerous, it is often a condition that people can learn to manageon their own.   Remain active. It is stressful on the back to sit or stand in one place. Do not sit, drive, or stand in one place for more than 30 minutes at a time. Take short walks on level surfaces as soon as pain allows.Try to increase the length of time you walk each day.  Do not stay in bed.Resting more than 1 or 2 days can delay your recovery.  Do not avoid exercise or work.Your body is made to move.It is not dangerous to be active, even though your back may hurt.Your back will likely heal faster if you return to being active before your pain is gone.  Pay attention to your body when you bend and lift. Many people have less discomfortwhen lifting if they bend their knees, keep the load close to their bodies,and  avoid twisting. Often, the most comfortable positions are those that put less stress on your recovering back.  Find a comfortable position to sleep. Use a firm mattress and lie on your side with your knees slightly bent. If you lie on your back, put a pillow under your knees.  Only take over-the-counter or prescription medicines as directed by your caregiver. Over-the-counter medicines to reduce pain and inflammation are often the most helpful.Your caregiver may prescribe muscle relaxant drugs.These medicines help dull your pain so you can more quickly return to your normal activities and healthy exercise.  Put ice on the injured area.  Put ice in a plastic bag.  Place a towel between your skin and the bag.  Leave the ice on for 15-20 minutes, 03-04 times a day for the first 2 to 3 days. After that, ice and heat may be alternated to reduce pain and spasms.  Ask your caregiver about trying back exercises and gentle massage. This may be of some benefit.  Avoid feeling anxious or stressed.Stress increases muscle tension and can worsen back pain.It is important to recognize when you are anxious or stressed and learn ways to manage it.Exercise is a great option. SEEK MEDICAL CARE IF:  You have pain that is not relieved with rest or medicine.  You have pain that does not improve in 1 week.  You have new symptoms.  You are generally not feeling well. SEEK   IMMEDIATE MEDICAL CARE IF:   You have pain that radiates from your back into your legs.  You develop new bowel or bladder control problems.  You have unusual weakness or numbness in your arms or legs.  You develop nausea or vomiting.  You develop abdominal pain.  You feel faint. Document Released: 06/20/2005 Document Revised: 12/20/2011 Document Reviewed: 10/22/2013 [employer] Patient Information 2015 [employer]. This information is not intended to replace advice given to you by your health care provider. Make sure you  discuss any questions you have with your health care provider.  

## 2014-03-05 NOTE — Progress Notes (Signed)
   Subjective:    Patient ID: Joanna Williams, female    DOB: 01/27/1941, 73 y.o.   MRN: [id number]  HPI Pt here for f/u cholesterol and back pain.  Back pain is not getting any better.  It is not radiating down her leg.     Review of Systems As above    Objective:   Physical Exam  BP 110/74  Pulse 72  Temp(Src) 97.8 F (36.6 C) (Oral)  Wt 155 lb 3.3 oz (70.4 kg)  SpO2 97% General appearance: alert, cooperative, appears stated age and no distress Neck: no adenopathy, supple, symmetrical, trachea midline and thyroid not enlarged, symmetric, no tenderness/mass/nodules Lungs: clear to auscultation bilaterally Heart: S1, S2 normal Extremities: extremities normal, atraumatic, no cyanosis or edema      Assessment & Plan:  1. Other screening breast examination   - MM DIGITAL SCREENING BILATERAL; Future  2. Insomnia   - zolpidem (AMBIEN) 5 MG tablet; TAKE 1 TABLET BY MOUTH AT BEDTIME AS NEEDED FOR SLEEP  Dispense: 30 tablet; Refill: 0  3. Gastroesophageal reflux disease without esophagitis   - omeprazole (PRILOSEC) 20 MG capsule; Take 1 capsule (20 mg total) by mouth daily.  Dispense: 90 capsule; Refill: 3  4. Back pain at L4-L5 level Worsening-- check xray Consider PT vs ortho - meloxicam (MOBIC) 15 MG tablet; Take 1 tablet (15 mg total) by mouth daily.  Dispense: 30 tablet; Refill: 0 - tiZANidine (ZANAFLEX) 4 MG tablet; Take 1 tablet (4 mg total) by mouth every 6 (six) hours as needed for muscle spasms.  Dispense: 30 tablet; Refill: 0 - DG Lumbar Spine Complete; Future  5. Other and unspecified hyperlipidemia Check labs, con't meds - Basic metabolic panel - Hepatic function panel - Lipid panel

## 2014-03-05 NOTE — Progress Notes (Signed)
Pre visit review using our clinic review tool, if applicable. No additional management support is needed unless otherwise documented below in the visit note. 

## 2014-03-07 ENCOUNTER — Telehealth: Payer: Self-pay | Admitting: Family Medicine

## 2014-03-07 MED ORDER — ATORVASTATIN CALCIUM 20 MG PO TABS: 20.0000 mg | ORAL_TABLET | Freq: Every day | ORAL | Status: DC

## 2014-03-07 NOTE — Telephone Encounter (Signed)
Discussed with patient and she voiced understanding, Rx faxed.     KP

## 2014-03-07 NOTE — Telephone Encounter (Signed)
Cholesterol--- LDL goal < 100, HDL >40, TG < 150. Diet and exercise will increase HDL and decrease LDL and TG. Fish, Fish Oil, Flaxseed oil will also help increase the HDL and decrease Triglycerides. Recheck labs in 3 months----- change zocor to Lipitor 20 mg #30 1 po qhs, lipid, hep.

## 2014-03-07 NOTE — Telephone Encounter (Signed)
Returning your call. °

## 2014-03-12 ENCOUNTER — Ambulatory Visit (HOSPITAL_BASED_OUTPATIENT_CLINIC_OR_DEPARTMENT_OTHER): Payer: Commercial Managed Care - HMO

## 2014-03-12 ENCOUNTER — Telehealth: Payer: Self-pay | Admitting: Family Medicine

## 2014-03-12 NOTE — Telephone Encounter (Signed)
Pt would like for you to give her a call regarding a fax that [employer] sent on yesterday pertaining to her sleep medicine. Pt also requesting a copy of her blood work.

## 2014-03-12 NOTE — Telephone Encounter (Signed)
Msg left with husband       KP

## 2014-03-14 NOTE — Telephone Encounter (Signed)
She said she spoke with [facility] and the faxed a letter for [place] to authorize the [place] and fax back to them. She said she spoke with Angie but never heard back from her. Angie please advise    KP

## 2014-03-17 NOTE — Telephone Encounter (Signed)
Received prior authorization request form via fax from [facility].  Form filled out as much as possible and and completed by Dr. Etter Sjogren.  Prior authorization faxed to [facility] at [phone]).  Confirmation received. Awaiting response.//AB/CMA

## 2014-03-19 ENCOUNTER — Ambulatory Visit (HOSPITAL_BASED_OUTPATIENT_CLINIC_OR_DEPARTMENT_OTHER)
Admission: RE | Admit: 2014-03-19 | Discharge: 2014-03-19 | Disposition: A | Discharge status: Home / Self Care | Payer: Medicare HMO | Source: Ambulatory Visit | Attending: Family Medicine | Admitting: Family Medicine

## 2014-03-19 DIAGNOSIS — Z1231 Encounter for screening mammogram for malignant neoplasm of breast: Secondary | ICD-10-CM | POA: Insufficient documentation

## 2014-03-19 DIAGNOSIS — Z1239 Encounter for other screening for malignant neoplasm of breast: Secondary | ICD-10-CM

## 2014-03-19 NOTE — Telephone Encounter (Signed)
Received approval letter on the Zolipidem Tartrate 5mg  via fax from [facility]  This authorization is good until 03/19/2015.   Called approval to [employer] Neu) at [place].  Also LMOM @ (9:17am) informing the pt of the approval.  Approval letter send for scaning.//AB/CMA

## 2014-05-12 ENCOUNTER — Encounter: Payer: Self-pay | Admitting: Medical

## 2014-05-12 ENCOUNTER — Ambulatory Visit (INDEPENDENT_AMBULATORY_CARE_PROVIDER_SITE_OTHER): Payer: Commercial Managed Care - HMO | Admitting: Medical

## 2014-05-12 VITALS — BP 138/71 | HR 77 | Temp 98.3°F | Ht 64.75 in | Wt 152.6 lb

## 2014-05-12 DIAGNOSIS — N390 Urinary tract infection, site not specified: Secondary | ICD-10-CM | POA: Insufficient documentation

## 2014-05-12 DIAGNOSIS — R350 Frequency of micturition: Secondary | ICD-10-CM

## 2014-05-12 LAB — POCT URINALYSIS DIPSTICK
Bilirubin, UA: NEGATIVE
Glucose, UA: NEGATIVE
KETONES UA: NEGATIVE
NITRITE UA: NEGATIVE
PH UA: 6
PROTEIN UA: NEGATIVE
Spec Grav, UA: <=1.005
Urobilinogen, UA: 0.2

## 2014-05-12 MED ORDER — CIPROFLOXACIN HCL 250 MG PO TABS: 250.0000 mg | ORAL_TABLET | Freq: Two times a day (BID) | ORAL | Status: DC

## 2014-05-12 MED ORDER — PHENAZOPYRIDINE HCL 200 MG PO TABS: 200.0000 mg | ORAL_TABLET | Freq: Three times a day (TID) | ORAL | Status: DC | PRN

## 2014-05-12 NOTE — Assessment & Plan Note (Signed)
Uti by symptoms and by ua. I am prescribing cipro and pyridium pending culture results. Follow up in 7 days any persisting or worsening signs or symptoms

## 2014-05-12 NOTE — Progress Notes (Signed)
Pre visit review using our clinic review tool, if applicable. No additional management support is needed unless otherwise documented below in the visit note. 

## 2014-05-12 NOTE — Patient Instructions (Addendum)
You do appear to have uti by symptoms and by ua. I am prescribing cipro and pyridium pending culture results. Follow up in 7 days any persisting or worsening signs or symptoms.

## 2014-05-12 NOTE — Progress Notes (Signed)
Subjective:    Patient ID: Joanna Williams, female    DOB: 10-Aug-1940, 73 y.o.   MRN: [id number]  HPI   Pt in with frequent urination and some suprapubic pressure.Pt has used old rx of pyridium. Symptom for 5-6 days. No nausea, no vomiting or fever. Maybe mild back pain. But some mild back pain hx in the past. Hx of occasional infections one time a year.  Past Medical History  Diagnosis Date  . Hyperlipidemia     History   Social History  . Marital Status: Married    Spouse Name: N/A    Number of Children: N/A  . Years of Education: N/A   Occupational History  . retired    Social History Main Topics  . Smoking status: Never Smoker   . Smokeless tobacco: Never Used  . Alcohol Use: Yes  . Drug Use: Yes    Special: Hydrocodone  . Sexual Activity:    Partners: Male   Other Topics Concern  . Not on file   Social History Narrative   Walking, yoga    Past Surgical History  Procedure Laterality Date  . Colonoscopy  2 yrs ago  . Total hip arthroplasty  08/10/2011    Procedure: TOTAL HIP ARTHROPLASTY;  Surgeon: Kerin Salen, MD;  Location: [place];  Service: Orthopedics;  Laterality: Right;  DEPUY/ PENNACLE POLY OR CERAMIC    Family History  Problem Relation Age of Onset  . Heart disease Mother   . Cancer Father 12    brain    Allergies  Allergen Reactions  . Niacin     REACTION: Nausea    Current Outpatient Prescriptions on File Prior to Visit  Medication Sig Dispense Refill  . atorvastatin (LIPITOR) 20 MG tablet Take 1 tablet (20 mg total) by mouth daily. 30 tablet 2  . omeprazole (PRILOSEC) 20 MG capsule Take 1 capsule (20 mg total) by mouth daily. 90 capsule 3  . tiZANidine (ZANAFLEX) 4 MG tablet Take 1 tablet (4 mg total) by mouth every 6 (six) hours as needed for muscle spasms. 30 tablet 0  . zolpidem (AMBIEN) 5 MG tablet TAKE 1 TABLET BY MOUTH AT BEDTIME AS NEEDED FOR SLEEP 30 tablet 0   No current facility-administered medications on file prior to  visit.    BP 138/71 mmHg  Pulse 77  Temp(Src) 98.3 F (36.8 C) (Oral)  Ht 5' 4.75" (1.645 m)  Wt 152 lb 9.6 oz (69.219 kg)  BMI 25.58 kg/m2  SpO2 96%     Review of Systems  Constitutional: Negative for fever, chills and fatigue.  Respiratory: Negative for cough, chest tightness, shortness of breath and wheezing.   Cardiovascular: Negative for chest pain and palpitations.  Gastrointestinal:       Suprapubic pressure.  Genitourinary: Positive for dysuria and frequency. Negative for decreased urine volume, difficulty urinating, genital sores, vaginal pain and pelvic pain.  Musculoskeletal:       Some hx of back pain. No cva area pain.  Neurological: Negative.   Hematological: Negative for adenopathy. Does not bruise/bleed easily.       Objective:   Physical Exam   General  Mental Status- Alert. Orientation- Orientation x 4.   Skin General:- Normal. Moisture- Dry. Temperature- Warm.    Heart Ausculation-RRR  Lungs Ausculation- Clear, even, unlabored bilaterlly.    Abdomen Palpation/Percussion: Palpation and Percussion of the abdomen reveal- Non Tender, No Rebound tenderness, No Rigidity(guarding), No Palpable abdominal masses and No jar tenderness. Mild  suprapubic tenderness. Liver:-Normal. Spleen:- Normal. Other Characteristics- No Costovertebral angle tenderness- Left or Costovertebral angle tenderness- Right.  Auscultation: Auscultation of the abdomen reveals- Bowel Sounds normal.  Back- no cva tenderness        Assessment & Plan:

## 2014-05-15 ENCOUNTER — Telehealth: Payer: Self-pay | Admitting: Medical

## 2014-05-15 LAB — CULTURE, URINE COMPREHENSIVE: Colony Count: >=100000

## 2014-05-15 NOTE — Telephone Encounter (Signed)
Sensitive to cipro which she was on. 3 days often is adequate. If she has any residual symptoms let me know and could extend rx. Otherwise if no symptoms won't give further antibiotic.

## 2014-05-19 ENCOUNTER — Other Ambulatory Visit: Payer: Self-pay

## 2014-05-19 MED ORDER — CIPROFLOXACIN HCL 250 MG PO TABS: 250.0000 mg | ORAL_TABLET | Freq: Two times a day (BID) | ORAL | Status: DC

## 2014-05-19 NOTE — Telephone Encounter (Signed)
Spoke with patient on phone today,states she was supposed to get another 3 day supply of Cipro for bladder infection. Medication sent to pharmacy.

## 2014-05-19 NOTE — Telephone Encounter (Addendum)
requesting a refill of cipro due to symptoms reoccurring. Pt would like to discuss. Pt states leave detail message advising medication was sent to pharmacy

## 2014-05-19 NOTE — Telephone Encounter (Signed)
Please advise      KP 

## 2014-05-19 NOTE — Telephone Encounter (Signed)
Pt was given an additional 3 days of cipro. After this rx if any symptoms then would recommend recheck and urine culture.

## 2014-07-02 ENCOUNTER — Other Ambulatory Visit: Payer: Self-pay | Admitting: Family Medicine

## 2014-08-06 ENCOUNTER — Other Ambulatory Visit: Payer: Self-pay | Admitting: Family Medicine

## 2014-08-06 NOTE — Telephone Encounter (Signed)
Atorvastatin request denied as pt is past due for repeat labs (see 03/07/14 phone note and future orders).

## 2014-08-10 ENCOUNTER — Other Ambulatory Visit: Payer: Self-pay | Admitting: Family Medicine

## 2014-08-11 ENCOUNTER — Other Ambulatory Visit: Payer: Self-pay

## 2014-08-11 DIAGNOSIS — E785 Hyperlipidemia, unspecified: Secondary | ICD-10-CM

## 2014-08-14 ENCOUNTER — Other Ambulatory Visit (INDEPENDENT_AMBULATORY_CARE_PROVIDER_SITE_OTHER): Payer: Commercial Managed Care - HMO

## 2014-08-14 DIAGNOSIS — E785 Hyperlipidemia, unspecified: Secondary | ICD-10-CM | POA: Diagnosis not present

## 2014-08-14 LAB — LIPID PANEL
Cholesterol: 214 mg/dL — ABNORMAL HIGH (ref 0–200)
HDL: 57.7 mg/dL (ref >39.00)
LDL Cholesterol: 125 mg/dL — ABNORMAL HIGH (ref 0–99)
NONHDL: 156.3
Total CHOL/HDL Ratio: 4
Triglycerides: 157 mg/dL — ABNORMAL HIGH (ref 0.0–149.0)
VLDL: 31.4 mg/dL (ref 0.0–40.0)

## 2014-08-14 LAB — BASIC METABOLIC PANEL
BUN: 19 mg/dL (ref 6–23)
CO2: 29 meq/L (ref 19–32)
Calcium: 9 mg/dL (ref 8.4–10.5)
Chloride: 105 mEq/L (ref 96–112)
Creatinine, Ser: 0.73 mg/dL (ref 0.40–1.20)
GFR: 82.69 mL/min (ref >60.00)
GLUCOSE: 82 mg/dL (ref 70–99)
POTASSIUM: 4 meq/L (ref 3.5–5.1)
SODIUM: 140 meq/L (ref 135–145)

## 2014-08-14 LAB — HEPATIC FUNCTION PANEL
ALT: 20 U/L (ref 0–35)
AST: 21 U/L (ref 0–37)
Albumin: 4 g/dL (ref 3.5–5.2)
Alkaline Phosphatase: 127 U/L — ABNORMAL HIGH (ref 39–117)
BILIRUBIN DIRECT: 0.1 mg/dL (ref 0.0–0.3)
BILIRUBIN TOTAL: 0.4 mg/dL (ref 0.2–1.2)
Total Protein: 6.5 g/dL (ref 6.0–8.3)

## 2014-08-25 ENCOUNTER — Telehealth: Payer: Self-pay

## 2014-08-25 DIAGNOSIS — E2839 Other primary ovarian failure: Secondary | ICD-10-CM

## 2014-08-25 DIAGNOSIS — R748 Abnormal levels of other serum enzymes: Secondary | ICD-10-CM

## 2014-08-25 MED ORDER — ATORVASTATIN CALCIUM 40 MG PO TABS: 40.0000 mg | ORAL_TABLET | Freq: Every day | ORAL | Status: DC

## 2014-08-25 NOTE — Telephone Encounter (Signed)
-----  Message from Rosalita Chessman, DO sent at 08/14/2014  9:09 PM EST ----- Alk phos elevated---  Recheck hep , with ggt, vita D3-- elevated liver enzymes Cholesterol--- LDL goal < 100,  HDL >40,  TG < 150.  Diet and exercise will increase HDL and decrease LDL and TG.  Fish,  Fish Oil, Flaxseed oil will also help increase the HDL and decrease Triglycerides.   Recheck labs in 3 months----inc lipitor 40 mg daily---hyperlipidemia ---lipid, hep

## 2014-08-25 NOTE — Telephone Encounter (Signed)
Discussed with patient and she verbalized understanding, apt scheduled and Rx faxed. Order for Dexa scan placed for [place] and I gave the patient the number to schedule.     KP

## 2014-08-26 ENCOUNTER — Other Ambulatory Visit (INDEPENDENT_AMBULATORY_CARE_PROVIDER_SITE_OTHER): Payer: Commercial Managed Care - HMO

## 2014-08-26 ENCOUNTER — Telehealth: Payer: Self-pay | Admitting: Family Medicine

## 2014-08-26 DIAGNOSIS — R748 Abnormal levels of other serum enzymes: Secondary | ICD-10-CM | POA: Diagnosis not present

## 2014-08-26 DIAGNOSIS — E785 Hyperlipidemia, unspecified: Secondary | ICD-10-CM | POA: Diagnosis not present

## 2014-08-26 LAB — HEPATIC FUNCTION PANEL
ALT: 24 U/L (ref 0–35)
AST: 24 U/L (ref 0–37)
Albumin: 4.2 g/dL (ref 3.5–5.2)
Alkaline Phosphatase: 145 U/L — ABNORMAL HIGH (ref 39–117)
BILIRUBIN DIRECT: 0.1 mg/dL (ref 0.0–0.3)
BILIRUBIN TOTAL: 0.5 mg/dL (ref 0.2–1.2)
TOTAL PROTEIN: 6.8 g/dL (ref 6.0–8.3)

## 2014-08-26 LAB — LIPID PANEL
Cholesterol: 215 mg/dL — ABNORMAL HIGH (ref 0–200)
HDL: 60.4 mg/dL (ref >39.00)
LDL CALC: 126 mg/dL — AB (ref 0–99)
NONHDL: 154.6
Total CHOL/HDL Ratio: 4
Triglycerides: 143 mg/dL (ref 0.0–149.0)
VLDL: 28.6 mg/dL (ref 0.0–40.0)

## 2014-08-26 LAB — VITAMIN D 25 HYDROXY (VIT D DEFICIENCY, FRACTURES): VITD: 23.76 ng/mL — AB (ref 30.00–100.00)

## 2014-08-26 LAB — GAMMA GT: GGT: 87 U/L — AB (ref 7–51)

## 2014-08-26 NOTE — Telephone Encounter (Signed)
Patient is here and would like a copy of her past labs

## 2014-08-26 NOTE — Telephone Encounter (Signed)
The labs were drawn today and not resulted at this time. I will mail once resulted.      KP

## 2014-09-02 ENCOUNTER — Other Ambulatory Visit: Payer: Self-pay

## 2014-09-02 MED ORDER — VITAMIN D (ERGOCALCIFEROL) 1.25 MG (50000 UNIT) PO CAPS: 50000.0000 [IU] | ORAL_CAPSULE | ORAL | Status: DC

## 2014-10-13 ENCOUNTER — Other Ambulatory Visit: Payer: Self-pay | Admitting: Family Medicine

## 2014-11-23 ENCOUNTER — Other Ambulatory Visit: Payer: Self-pay | Admitting: Family Medicine

## 2014-12-29 ENCOUNTER — Other Ambulatory Visit: Payer: Self-pay

## 2014-12-30 DIAGNOSIS — E784 Other hyperlipidemia: Secondary | ICD-10-CM | POA: Diagnosis not present

## 2014-12-30 DIAGNOSIS — H5210 Myopia, unspecified eye: Secondary | ICD-10-CM | POA: Diagnosis not present

## 2014-12-30 DIAGNOSIS — H3531 Nonexudative age-related macular degeneration: Secondary | ICD-10-CM | POA: Diagnosis not present

## 2014-12-30 DIAGNOSIS — H5200 Hypermetropia, unspecified eye: Secondary | ICD-10-CM | POA: Diagnosis not present

## 2015-01-08 ENCOUNTER — Other Ambulatory Visit: Payer: Self-pay | Admitting: Family Medicine

## 2015-01-16 ENCOUNTER — Telehealth: Payer: Self-pay

## 2015-01-16 DIAGNOSIS — G4700 Insomnia, unspecified: Secondary | ICD-10-CM

## 2015-01-16 MED ORDER — ZOLPIDEM TARTRATE 5 MG PO TABS: ORAL_TABLET | ORAL | Status: DC

## 2015-01-16 NOTE — Telephone Encounter (Signed)
Refill x1 

## 2015-01-16 NOTE — Telephone Encounter (Signed)
Rx faxed.    KP 

## 2015-01-16 NOTE — Telephone Encounter (Signed)
Last seen and filled 03/05/14 #30 no refills   Please advise     KP

## 2015-02-12 ENCOUNTER — Other Ambulatory Visit: Payer: Self-pay | Admitting: Family Medicine

## 2015-03-18 ENCOUNTER — Encounter: Payer: Self-pay | Admitting: Behavioral Health

## 2015-03-18 ENCOUNTER — Telehealth: Payer: Self-pay | Admitting: Behavioral Health

## 2015-03-18 ENCOUNTER — Other Ambulatory Visit: Payer: Self-pay | Admitting: Family Medicine

## 2015-03-18 DIAGNOSIS — Z1231 Encounter for screening mammogram for malignant neoplasm of breast: Secondary | ICD-10-CM

## 2015-03-18 NOTE — Telephone Encounter (Signed)
Pre-Visit Call completed with patient and chart updated.   Pre-Visit Info documented in Specialty Comments under [employer].    

## 2015-03-19 ENCOUNTER — Encounter: Payer: Self-pay | Admitting: Family Medicine

## 2015-03-19 ENCOUNTER — Ambulatory Visit (INDEPENDENT_AMBULATORY_CARE_PROVIDER_SITE_OTHER): Payer: Commercial Managed Care - HMO | Admitting: Family Medicine

## 2015-03-19 VITALS — BP 134/82 | HR 59 | Temp 97.5°F | Ht 65.0 in | Wt 151.8 lb

## 2015-03-19 DIAGNOSIS — B354 Tinea corporis: Secondary | ICD-10-CM

## 2015-03-19 DIAGNOSIS — Z0000 Encounter for general adult medical examination without abnormal findings: Secondary | ICD-10-CM

## 2015-03-19 DIAGNOSIS — Z23 Encounter for immunization: Secondary | ICD-10-CM | POA: Diagnosis not present

## 2015-03-19 DIAGNOSIS — E785 Hyperlipidemia, unspecified: Secondary | ICD-10-CM | POA: Diagnosis not present

## 2015-03-19 DIAGNOSIS — R319 Hematuria, unspecified: Secondary | ICD-10-CM

## 2015-03-19 DIAGNOSIS — N390 Urinary tract infection, site not specified: Secondary | ICD-10-CM | POA: Diagnosis not present

## 2015-03-19 DIAGNOSIS — R8299 Other abnormal findings in urine: Secondary | ICD-10-CM | POA: Diagnosis not present

## 2015-03-19 DIAGNOSIS — G4700 Insomnia, unspecified: Secondary | ICD-10-CM | POA: Diagnosis not present

## 2015-03-19 DIAGNOSIS — R8290 Unspecified abnormal findings in urine: Secondary | ICD-10-CM

## 2015-03-19 DIAGNOSIS — R82998 Other abnormal findings in urine: Secondary | ICD-10-CM

## 2015-03-19 LAB — CBC WITH DIFFERENTIAL/PLATELET
BASOS ABS: 0 10*3/uL (ref 0.0–0.1)
Basophils Relative: 0.8 % (ref 0.0–3.0)
EOS ABS: 0.2 10*3/uL (ref 0.0–0.7)
Eosinophils Relative: 3.2 % (ref 0.0–5.0)
HEMATOCRIT: 43.8 % (ref 36.0–46.0)
HEMOGLOBIN: 14.7 g/dL (ref 12.0–15.0)
LYMPHS PCT: 32 % (ref 12.0–46.0)
Lymphs Abs: 2.1 10*3/uL (ref 0.7–4.0)
MCHC: 33.7 g/dL (ref 30.0–36.0)
MCV: 93.7 fl (ref 78.0–100.0)
Monocytes Absolute: 0.6 10*3/uL (ref 0.1–1.0)
Monocytes Relative: 8.6 % (ref 3.0–12.0)
Neutro Abs: 3.6 10*3/uL (ref 1.4–7.7)
Neutrophils Relative %: 55.4 % (ref 43.0–77.0)
Platelets: 243 10*3/uL (ref 150.0–400.0)
RBC: 4.67 Mil/uL (ref 3.87–5.11)
RDW: 13.3 % (ref 11.5–15.5)
WBC: 6.4 10*3/uL (ref 4.0–10.5)

## 2015-03-19 LAB — POCT URINALYSIS DIPSTICK
BILIRUBIN UA: NEGATIVE
GLUCOSE UA: NEGATIVE
Ketones, UA: NEGATIVE
Leukocytes, UA: NEGATIVE
NITRITE UA: NEGATIVE
Protein, UA: NEGATIVE
SPEC GRAV UA: 1.02
Urobilinogen, UA: 0.2
pH, UA: 6

## 2015-03-19 LAB — LIPID PANEL
CHOL/HDL RATIO: 3
Cholesterol: 177 mg/dL (ref 0–200)
HDL: 63 mg/dL (ref >39.00)
LDL CALC: 96 mg/dL (ref 0–99)
NonHDL: 114.44
TRIGLYCERIDES: 93 mg/dL (ref 0.0–149.0)
VLDL: 18.6 mg/dL (ref 0.0–40.0)

## 2015-03-19 LAB — COMPREHENSIVE METABOLIC PANEL
ALBUMIN: 4.3 g/dL (ref 3.5–5.2)
ALK PHOS: 178 U/L — AB (ref 39–117)
ALT: 28 U/L (ref 0–35)
AST: 26 U/L (ref 0–37)
BILIRUBIN TOTAL: 0.6 mg/dL (ref 0.2–1.2)
BUN: 17 mg/dL (ref 6–23)
CALCIUM: 9.4 mg/dL (ref 8.4–10.5)
CO2: 32 mEq/L (ref 19–32)
CREATININE: 0.67 mg/dL (ref 0.40–1.20)
Chloride: 105 mEq/L (ref 96–112)
GFR: 91.14 mL/min (ref >60.00)
Glucose, Bld: 92 mg/dL (ref 70–99)
Potassium: 4.5 mEq/L (ref 3.5–5.1)
Sodium: 141 mEq/L (ref 135–145)
Total Protein: 7 g/dL (ref 6.0–8.3)

## 2015-03-19 LAB — MICROALBUMIN / CREATININE URINE RATIO
CREATININE, U: 88.4 mg/dL
Microalb Creat Ratio: 0.8 mg/g (ref 0.0–30.0)
Microalb, Ur: <0.7 mg/dL (ref 0.0–1.9)

## 2015-03-19 MED ORDER — NYSTATIN 100000 UNIT/GM EX POWD: Freq: Four times a day (QID) | CUTANEOUS | Status: DC

## 2015-03-19 MED ORDER — ZOLPIDEM TARTRATE 5 MG PO TABS: ORAL_TABLET | ORAL | Status: DC

## 2015-03-19 MED ORDER — CVS FISH OIL + D3 1200-1000 MG-UNIT PO CAPS: ORAL_CAPSULE | ORAL | Status: DC

## 2015-03-19 NOTE — Patient Instructions (Signed)
Preventive Care for Adults A healthy lifestyle and preventive care can promote health and wellness. Preventive health guidelines for women include the following key practices.  A routine yearly physical is a good way to check with your health care provider about your health and preventive screening. It is a chance to share any concerns and updates on your health and to receive a thorough exam.  Visit your dentist for a routine exam and preventive care every 6 months. Brush your teeth twice a day and floss once a day. Good oral hygiene prevents tooth decay and gum disease.  The frequency of eye exams is based on your age, health, family medical history, use of contact lenses, and other factors. Follow your health care provider's recommendations for frequency of eye exams.  Eat a healthy diet. Foods like vegetables, fruits, whole grains, low-fat dairy products, and lean protein foods contain the nutrients you need without too many calories. Decrease your intake of foods high in solid fats, added sugars, and salt. Eat the right amount of calories for you.Get information about a proper diet from your health care provider, if necessary.  Regular physical exercise is one of the most important things you can do for your health. Most adults should get at least 150 minutes of moderate-intensity exercise (any activity that increases your heart rate and causes you to sweat) each week. In addition, most adults need muscle-strengthening exercises on 2 or more days a week.  Maintain a healthy weight. The body mass index (BMI) is a screening tool to identify possible weight problems. It provides an estimate of body fat based on height and weight. Your health care provider can find your BMI and can help you achieve or maintain a healthy weight.For adults 20 years and older:  A BMI below 18.5 is considered underweight.  A BMI of 18.5 to 24.9 is normal.  A BMI of 25 to 29.9 is considered overweight.  A BMI of  30 and above is considered obese.  Maintain normal blood lipids and cholesterol levels by exercising and minimizing your intake of saturated fat. Eat a balanced diet with plenty of fruit and vegetables. Blood tests for lipids and cholesterol should begin at age 76 and be repeated every 5 years. If your lipid or cholesterol levels are high, you are over 50, or you are at high risk for heart disease, you may need your cholesterol levels checked more frequently.Ongoing high lipid and cholesterol levels should be treated with medicines if diet and exercise are not working.  If you smoke, find out from your health care provider how to quit. If you do not use tobacco, do not start.  Lung cancer screening is recommended for adults aged 22-80 years who are at high risk for developing lung cancer because of a history of smoking. A yearly low-dose CT scan of the lungs is recommended for people who have at least a 30-pack-year history of smoking and are a current smoker or have quit within the past 15 years. A pack year of smoking is smoking an average of 1 pack of cigarettes a day for 1 year (for example: 1 pack a day for 30 years or 2 packs a day for 15 years). Yearly screening should continue until the smoker has stopped smoking for at least 15 years. Yearly screening should be stopped for people who develop a health problem that would prevent them from having lung cancer treatment.  If you are pregnant, do not drink alcohol. If you are breastfeeding,  be very cautious about drinking alcohol. If you are not pregnant and choose to drink alcohol, do not have more than 1 drink per day. One drink is considered to be 12 ounces (355 mL) of beer, 5 ounces (148 mL) of wine, or 1.5 ounces (44 mL) of liquor.  Avoid use of street drugs. Do not share needles with anyone. Ask for help if you need support or instructions about stopping the use of drugs.  High blood pressure causes heart disease and increases the risk of  stroke. Your blood pressure should be checked at least every 1 to 2 years. Ongoing high blood pressure should be treated with medicines if weight loss and exercise do not work.  If you are 3-86 years old, ask your health care provider if you should take aspirin to prevent strokes.  Diabetes screening involves taking a blood sample to check your fasting blood sugar level. This should be done once every 3 years, after age 67, if you are within normal weight and without risk factors for diabetes. Testing should be considered at a younger age or be carried out more frequently if you are overweight and have at least 1 risk factor for diabetes.  Breast cancer screening is essential preventive care for women. You should practice "breast self-awareness." This means understanding the normal appearance and feel of your breasts and may include breast self-examination. Any changes detected, no matter how small, should be reported to a health care provider. Women in their 8s and 30s should have a clinical breast exam (CBE) by a health care provider as part of a regular health exam every 1 to 3 years. After age 70, women should have a CBE every year. Starting at age 25, women should consider having a mammogram (breast X-ray test) every year. Women who have a family history of breast cancer should talk to their health care provider about genetic screening. Women at a high risk of breast cancer should talk to their health care providers about having an MRI and a mammogram every year.  Breast cancer gene (BRCA)-related cancer risk assessment is recommended for women who have family members with BRCA-related cancers. BRCA-related cancers include breast, ovarian, tubal, and peritoneal cancers. Having family members with these cancers may be associated with an increased risk for harmful changes (mutations) in the breast cancer genes BRCA1 and BRCA2. Results of the assessment will determine the need for genetic counseling and  BRCA1 and BRCA2 testing.  Routine pelvic exams to screen for cancer are no longer recommended for nonpregnant women who are considered low risk for cancer of the pelvic organs (ovaries, uterus, and vagina) and who do not have symptoms. Ask your health care provider if a screening pelvic exam is right for you.  If you have had past treatment for cervical cancer or a condition that could lead to cancer, you need Pap tests and screening for cancer for at least 20 years after your treatment. If Pap tests have been discontinued, your risk factors (such as having a new sexual partner) need to be reassessed to determine if screening should be resumed. Some women have medical problems that increase the chance of getting cervical cancer. In these cases, your health care provider may recommend more frequent screening and Pap tests.  The HPV test is an additional test that may be used for cervical cancer screening. The HPV test looks for the virus that can cause the cell changes on the cervix. The cells collected during the Pap test can be  tested for HPV. The HPV test could be used to screen women aged 30 years and older, and should be used in women of any age who have unclear Pap test results. After the age of 30, women should have HPV testing at the same frequency as a Pap test.  Colorectal cancer can be detected and often prevented. Most routine colorectal cancer screening begins at the age of 50 years and continues through age 75 years. However, your health care provider may recommend screening at an earlier age if you have risk factors for colon cancer. On a yearly basis, your health care provider may provide home test kits to check for hidden blood in the stool. Use of a small camera at the end of a tube, to directly examine the colon (sigmoidoscopy or colonoscopy), can detect the earliest forms of colorectal cancer. Talk to your health care provider about this at age 50, when routine screening begins. Direct  exam of the colon should be repeated every 5-10 years through age 75 years, unless early forms of pre-cancerous polyps or small growths are found.  People who are at an increased risk for hepatitis B should be screened for this virus. You are considered at high risk for hepatitis B if:  You were born in a country where hepatitis B occurs often. Talk with your health care provider about which countries are considered high risk.  Your parents were born in a high-risk country and you have not received a shot to protect against hepatitis B (hepatitis B vaccine).  You have HIV or AIDS.  You use needles to inject street drugs.  You live with, or have sex with, someone who has hepatitis B.  You get hemodialysis treatment.  You take certain medicines for conditions like cancer, organ transplantation, and autoimmune conditions.  Hepatitis C blood testing is recommended for all people born from 1945 through 1965 and any individual with known risks for hepatitis C.  Practice safe sex. Use condoms and avoid high-risk sexual practices to reduce the spread of sexually transmitted infections (STIs). STIs include gonorrhea, chlamydia, syphilis, trichomonas, herpes, HPV, and human immunodeficiency virus (HIV). Herpes, HIV, and HPV are viral illnesses that have no cure. They can result in disability, cancer, and death.  You should be screened for sexually transmitted illnesses (STIs) including gonorrhea and chlamydia if:  You are sexually active and are younger than 24 years.  You are older than 24 years and your health care provider tells you that you are at risk for this type of infection.  Your sexual activity has changed since you were last screened and you are at an increased risk for chlamydia or gonorrhea. Ask your health care provider if you are at risk.  If you are at risk of being infected with HIV, it is recommended that you take a prescription medicine daily to prevent HIV infection. This is  called preexposure prophylaxis (PrEP). You are considered at risk if:  You are a heterosexual woman, are sexually active, and are at increased risk for HIV infection.  You take drugs by injection.  You are sexually active with a partner who has HIV.  Talk with your health care provider about whether you are at high risk of being infected with HIV. If you choose to begin PrEP, you should first be tested for HIV. You should then be tested every 3 months for as long as you are taking PrEP.  Osteoporosis is a disease in which the bones lose minerals and strength   with aging. This can result in serious bone fractures or breaks. The risk of osteoporosis can be identified using a bone density scan. Women ages 65 years and over and women at risk for fractures or osteoporosis should discuss screening with their health care providers. Ask your health care provider whether you should take a calcium supplement or vitamin D to reduce the rate of osteoporosis.  Menopause can be associated with physical symptoms and risks. Hormone replacement therapy is available to decrease symptoms and risks. You should talk to your health care provider about whether hormone replacement therapy is right for you.  Use sunscreen. Apply sunscreen liberally and repeatedly throughout the day. You should seek shade when your shadow is shorter than you. Protect yourself by wearing long sleeves, pants, a wide-brimmed hat, and sunglasses year round, whenever you are outdoors.  Once a month, do a whole body skin exam, using a mirror to look at the skin on your back. Tell your health care provider of new moles, moles that have irregular borders, moles that are larger than a pencil eraser, or moles that have changed in shape or color.  Stay current with required vaccines (immunizations).  Influenza vaccine. All adults should be immunized every year.  Tetanus, diphtheria, and acellular pertussis (Td, Tdap) vaccine. Pregnant women should  receive 1 dose of Tdap vaccine during each pregnancy. The dose should be obtained regardless of the length of time since the last dose. Immunization is preferred during the 27th-36th week of gestation. An adult who has not previously received Tdap or who does not know her vaccine status should receive 1 dose of Tdap. This initial dose should be followed by tetanus and diphtheria toxoids (Td) booster doses every 10 years. Adults with an unknown or incomplete history of completing a 3-dose immunization series with Td-containing vaccines should begin or complete a primary immunization series including a Tdap dose. Adults should receive a Td booster every 10 years.  Varicella vaccine. An adult without evidence of immunity to varicella should receive 2 doses or a second dose if she has previously received 1 dose. Pregnant females who do not have evidence of immunity should receive the first dose after pregnancy. This first dose should be obtained before leaving the health care facility. The second dose should be obtained 4-8 weeks after the first dose.  Human papillomavirus (HPV) vaccine. Females aged 13-26 years who have not received the vaccine previously should obtain the 3-dose series. The vaccine is not recommended for use in pregnant females. However, pregnancy testing is not needed before receiving a dose. If a female is found to be pregnant after receiving a dose, no treatment is needed. In that case, the remaining doses should be delayed until after the pregnancy. Immunization is recommended for any person with an immunocompromised condition through the age of 26 years if she did not get any or all doses earlier. During the 3-dose series, the second dose should be obtained 4-8 weeks after the first dose. The third dose should be obtained 24 weeks after the first dose and 16 weeks after the second dose.  Zoster vaccine. One dose is recommended for adults aged 60 years or older unless certain conditions are  present.  Measles, mumps, and rubella (MMR) vaccine. Adults born before 1957 generally are considered immune to measles and mumps. Adults born in 1957 or later should have 1 or more doses of MMR vaccine unless there is a contraindication to the vaccine or there is laboratory evidence of immunity to   each of the three diseases. A routine second dose of MMR vaccine should be obtained at least 28 days after the first dose for students attending postsecondary schools, health care workers, or international travelers. People who received inactivated measles vaccine or an unknown type of measles vaccine during 1963-1967 should receive 2 doses of MMR vaccine. People who received inactivated mumps vaccine or an unknown type of mumps vaccine before 1979 and are at high risk for mumps infection should consider immunization with 2 doses of MMR vaccine. For females of childbearing age, rubella immunity should be determined. If there is no evidence of immunity, females who are not pregnant should be vaccinated. If there is no evidence of immunity, females who are pregnant should delay immunization until after pregnancy. Unvaccinated health care workers born before 1957 who lack laboratory evidence of measles, mumps, or rubella immunity or laboratory confirmation of disease should consider measles and mumps immunization with 2 doses of MMR vaccine or rubella immunization with 1 dose of MMR vaccine.  Pneumococcal 13-valent conjugate (PCV13) vaccine. When indicated, a person who is uncertain of her immunization history and has no record of immunization should receive the PCV13 vaccine. An adult aged 19 years or older who has certain medical conditions and has not been previously immunized should receive 1 dose of PCV13 vaccine. This PCV13 should be followed with a dose of pneumococcal polysaccharide (PPSV23) vaccine. The PPSV23 vaccine dose should be obtained at least 8 weeks after the dose of PCV13 vaccine. An adult aged 19  years or older who has certain medical conditions and previously received 1 or more doses of PPSV23 vaccine should receive 1 dose of PCV13. The PCV13 vaccine dose should be obtained 1 or more years after the last PPSV23 vaccine dose.  Pneumococcal polysaccharide (PPSV23) vaccine. When PCV13 is also indicated, PCV13 should be obtained first. All adults aged 65 years and older should be immunized. An adult younger than age 65 years who has certain medical conditions should be immunized. Any person who resides in a nursing home or long-term care facility should be immunized. An adult smoker should be immunized. People with an immunocompromised condition and certain other conditions should receive both PCV13 and PPSV23 vaccines. People with human immunodeficiency virus (HIV) infection should be immunized as soon as possible after diagnosis. Immunization during chemotherapy or radiation therapy should be avoided. Routine use of PPSV23 vaccine is not recommended for American Indians, [place] Natives, or people younger than 65 years unless there are medical conditions that require PPSV23 vaccine. When indicated, people who have unknown immunization and have no record of immunization should receive PPSV23 vaccine. One-time revaccination 5 years after the first dose of PPSV23 is recommended for people aged 19-64 years who have chronic kidney failure, nephrotic syndrome, asplenia, or immunocompromised conditions. People who received 1-2 doses of PPSV23 before age 65 years should receive another dose of PPSV23 vaccine at age 65 years or later if at least 5 years have passed since the previous dose. Doses of PPSV23 are not needed for people immunized with PPSV23 at or after age 65 years.  Meningococcal vaccine. Adults with asplenia or persistent complement component deficiencies should receive 2 doses of quadrivalent meningococcal conjugate (MenACWY-D) vaccine. The doses should be obtained at least 2 months apart.  Microbiologists working with certain meningococcal bacteria, military recruits, people at risk during an outbreak, and people who travel to or live in countries with a high rate of meningitis should be immunized. A first-year college student up through age   21 years who is living in a residence hall should receive a dose if she did not receive a dose on or after her 16th birthday. Adults who have certain high-risk conditions should receive one or more doses of vaccine.  Hepatitis A vaccine. Adults who wish to be protected from this disease, have certain high-risk conditions, work with hepatitis A-infected animals, work in hepatitis A research labs, or travel to or work in countries with a high rate of hepatitis A should be immunized. Adults who were previously unvaccinated and who anticipate close contact with an international adoptee during the first 60 days after arrival in the [place] from a country with a high rate of hepatitis A should be immunized.  Hepatitis B vaccine. Adults who wish to be protected from this disease, have certain high-risk conditions, may be exposed to blood or other infectious body fluids, are household contacts or sex partners of hepatitis B positive people, are clients or workers in certain care facilities, or travel to or work in countries with a high rate of hepatitis B should be immunized.  Haemophilus influenzae type b (Hib) vaccine. A previously unvaccinated person with asplenia or sickle cell disease or having a scheduled splenectomy should receive 1 dose of Hib vaccine. Regardless of previous immunization, a recipient of a hematopoietic stem cell transplant should receive a 3-dose series 6-12 months after her successful transplant. Hib vaccine is not recommended for adults with HIV infection. Preventive Services / Frequency Ages 64 to 68 years  Blood pressure check.** / Every 1 to 2 years.  Lipid and cholesterol check.** / Every 5 years beginning at age  22.  Clinical breast exam.** / Every 3 years for women in their 88s and 53s.  BRCA-related cancer risk assessment.** / For women who have family members with a BRCA-related cancer (breast, ovarian, tubal, or peritoneal cancers).  Pap test.** / Every 2 years from ages 90 through 51. Every 3 years starting at age 21 through age 56 or 3 with a history of 3 consecutive normal Pap tests.  HPV screening.** / Every 3 years from ages 24 through ages 1 to 46 with a history of 3 consecutive normal Pap tests.  Hepatitis C blood test.** / For any individual with known risks for hepatitis C.  Skin self-exam. / Monthly.  Influenza vaccine. / Every year.  Tetanus, diphtheria, and acellular pertussis (Tdap, Td) vaccine.** / Consult your health care provider. Pregnant women should receive 1 dose of Tdap vaccine during each pregnancy. 1 dose of Td every 10 years.  Varicella vaccine.** / Consult your health care provider. Pregnant females who do not have evidence of immunity should receive the first dose after pregnancy.  HPV vaccine. / 3 doses over 6 months, if 72 and younger. The vaccine is not recommended for use in pregnant females. However, pregnancy testing is not needed before receiving a dose.  Measles, mumps, rubella (MMR) vaccine.** / You need at least 1 dose of MMR if you were born in 1957 or later. You may also need a 2nd dose. For females of childbearing age, rubella immunity should be determined. If there is no evidence of immunity, females who are not pregnant should be vaccinated. If there is no evidence of immunity, females who are pregnant should delay immunization until after pregnancy.  Pneumococcal 13-valent conjugate (PCV13) vaccine.** / Consult your health care provider.  Pneumococcal polysaccharide (PPSV23) vaccine.** / 1 to 2 doses if you smoke cigarettes or if you have certain conditions.  Meningococcal vaccine.** /  1 dose if you are age 19 to 21 years and a first-year college  student living in a residence hall, or have one of several medical conditions, you need to get vaccinated against meningococcal disease. You may also need additional booster doses.  Hepatitis A vaccine.** / Consult your health care provider.  Hepatitis B vaccine.** / Consult your health care provider.  Haemophilus influenzae type b (Hib) vaccine.** / Consult your health care provider. Ages 40 to 64 years  Blood pressure check.** / Every 1 to 2 years.  Lipid and cholesterol check.** / Every 5 years beginning at age 20 years.  Lung cancer screening. / Every year if you are aged 55-80 years and have a 30-pack-year history of smoking and currently smoke or have quit within the past 15 years. Yearly screening is stopped once you have quit smoking for at least 15 years or develop a health problem that would prevent you from having lung cancer treatment.  Clinical breast exam.** / Every year after age 40 years.  BRCA-related cancer risk assessment.** / For women who have family members with a BRCA-related cancer (breast, ovarian, tubal, or peritoneal cancers).  Mammogram.** / Every year beginning at age 40 years and continuing for as long as you are in good health. Consult with your health care provider.  Pap test.** / Every 3 years starting at age 30 years through age 65 or 70 years with a history of 3 consecutive normal Pap tests.  HPV screening.** / Every 3 years from ages 30 years through ages 65 to 70 years with a history of 3 consecutive normal Pap tests.  Fecal occult blood test (FOBT) of stool. / Every year beginning at age 50 years and continuing until age 75 years. You may not need to do this test if you get a colonoscopy every 10 years.  Flexible sigmoidoscopy or colonoscopy.** / Every 5 years for a flexible sigmoidoscopy or every 10 years for a colonoscopy beginning at age 50 years and continuing until age 75 years.  Hepatitis C blood test.** / For all people born from 1945 through  1965 and any individual with known risks for hepatitis C.  Skin self-exam. / Monthly.  Influenza vaccine. / Every year.  Tetanus, diphtheria, and acellular pertussis (Tdap/Td) vaccine.** / Consult your health care provider. Pregnant women should receive 1 dose of Tdap vaccine during each pregnancy. 1 dose of Td every 10 years.  Varicella vaccine.** / Consult your health care provider. Pregnant females who do not have evidence of immunity should receive the first dose after pregnancy.  Zoster vaccine.** / 1 dose for adults aged 60 years or older.  Measles, mumps, rubella (MMR) vaccine.** / You need at least 1 dose of MMR if you were born in 1957 or later. You may also need a 2nd dose. For females of childbearing age, rubella immunity should be determined. If there is no evidence of immunity, females who are not pregnant should be vaccinated. If there is no evidence of immunity, females who are pregnant should delay immunization until after pregnancy.  Pneumococcal 13-valent conjugate (PCV13) vaccine.** / Consult your health care provider.  Pneumococcal polysaccharide (PPSV23) vaccine.** / 1 to 2 doses if you smoke cigarettes or if you have certain conditions.  Meningococcal vaccine.** / Consult your health care provider.  Hepatitis A vaccine.** / Consult your health care provider.  Hepatitis B vaccine.** / Consult your health care provider.  Haemophilus influenzae type b (Hib) vaccine.** / Consult your health care provider. Ages 65   years and over  Blood pressure check.** / Every 1 to 2 years.  Lipid and cholesterol check.** / Every 5 years beginning at age 22 years.  Lung cancer screening. / Every year if you are aged 73-80 years and have a 30-pack-year history of smoking and currently smoke or have quit within the past 15 years. Yearly screening is stopped once you have quit smoking for at least 15 years or develop a health problem that would prevent you from having lung cancer  treatment.  Clinical breast exam.** / Every year after age 4 years.  BRCA-related cancer risk assessment.** / For women who have family members with a BRCA-related cancer (breast, ovarian, tubal, or peritoneal cancers).  Mammogram.** / Every year beginning at age 40 years and continuing for as long as you are in good health. Consult with your health care provider.  Pap test.** / Every 3 years starting at age 9 years through age 34 or 91 years with 3 consecutive normal Pap tests. Testing can be stopped between 65 and 70 years with 3 consecutive normal Pap tests and no abnormal Pap or HPV tests in the past 10 years.  HPV screening.** / Every 3 years from ages 57 years through ages 64 or 45 years with a history of 3 consecutive normal Pap tests. Testing can be stopped between 65 and 70 years with 3 consecutive normal Pap tests and no abnormal Pap or HPV tests in the past 10 years.  Fecal occult blood test (FOBT) of stool. / Every year beginning at age 15 years and continuing until age 17 years. You may not need to do this test if you get a colonoscopy every 10 years.  Flexible sigmoidoscopy or colonoscopy.** / Every 5 years for a flexible sigmoidoscopy or every 10 years for a colonoscopy beginning at age 86 years and continuing until age 71 years.  Hepatitis C blood test.** / For all people born from [birth year] through 1966 and any individual with known risks for hepatitis C.  Osteoporosis screening.** / A one-time screening for women ages 83 years and over and women at risk for fractures or osteoporosis.  Skin self-exam. / Monthly.  Influenza vaccine. / Every year.  Tetanus, diphtheria, and acellular pertussis (Tdap/Td) vaccine.** / 1 dose of Td every 10 years.  Varicella vaccine.** / Consult your health care provider.  Zoster vaccine.** / 1 dose for adults aged 61 years or older.  Pneumococcal 13-valent conjugate (PCV13) vaccine.** / Consult your health care provider.  Pneumococcal  polysaccharide (PPSV23) vaccine.** / 1 dose for all adults aged 28 years and older.  Meningococcal vaccine.** / Consult your health care provider.  Hepatitis A vaccine.** / Consult your health care provider.  Hepatitis B vaccine.** / Consult your health care provider.  Haemophilus influenzae type b (Hib) vaccine.** / Consult your health care provider. ** Family history and personal history of risk and conditions may change your health care provider's recommendations. Document Released: 08/16/2001 Document Revised: 11/04/2013 Document Reviewed: 11/15/2010 [facility] Patient Information 2015 [place], [place]. This information is not intended to replace advice given to you by your health care provider. Make sure you discuss any questions you have with your health care provider.

## 2015-03-19 NOTE — Progress Notes (Signed)
Pre visit review using our clinic review tool, if applicable. No additional management support is needed unless otherwise documented below in the visit note. 

## 2015-03-19 NOTE — Progress Notes (Signed)
Subjective:   Joanna Williams is a 74 y.o. female who presents for Medicare Annual (Subsequent) preventive examination.  Review of Systems:   Review of Systems  Constitutional: Negative for activity change, appetite change and fatigue.  HENT: Negative for hearing loss, congestion, tinnitus and ear discharge.   Eyes: Negative for visual disturbance (see optho q1y -- vision corrected to 20/20 with glasses).  Respiratory: Negative for cough, chest tightness and shortness of breath.   Cardiovascular: Negative for chest pain, palpitations and leg swelling.  Gastrointestinal: Negative for abdominal pain, diarrhea, constipation and abdominal distention.  Genitourinary: Negative for urgency, frequency, decreased urine volume and difficulty urinating.  Musculoskeletal: Negative for back pain, arthralgias and gait problem.  Skin: Negative for color change, pallor and rash.  Neurological: Negative for dizziness, light-headedness, numbness and headaches.  Hematological: Negative for adenopathy. Does not bruise/bleed easily.  Psychiatric/Behavioral: Negative for suicidal ideas, confusion, sleep disturbance, self-injury, dysphoric mood, decreased concentration and agitation.  Pt is able to read and write and can do all ADLs No risk for falling No abuse/ violence in home           Objective:     Vitals: BP 134/82 mmHg  Pulse 59  Temp(Src) 97.5 F (36.4 C) (Oral)  Ht 5\' 5"  (1.651 m)  Wt 151 lb 12.8 oz (68.856 kg)  BMI 25.26 kg/m2  SpO2 95% BP 134/82 mmHg  Pulse 59  Temp(Src) 97.5 F (36.4 C) (Oral)  Ht 5\' 5"  (1.651 m)  Wt 151 lb 12.8 oz (68.856 kg)  BMI 25.26 kg/m2  SpO2 95% General appearance: alert, cooperative, appears stated age and no distress Head: Normocephalic, without obvious abnormality, atraumatic Eyes: conjunctivae/corneas clear. PERRL, EOM's intact. Fundi benign. Ears: normal TM's and external ear canals both ears Nose: Nares normal. Septum midline. Mucosa  normal. No drainage or sinus tenderness. Throat: lips, mucosa, and tongue normal; teeth and gums normal Neck: no adenopathy, no carotid bruit, no JVD, supple, symmetrical, trachea midline and thyroid not enlarged, symmetric, no tenderness/mass/nodules Back: symmetric, no curvature. ROM normal. No CVA tenderness. Lungs: clear to auscultation bilaterally Breasts: normal appearance, no masses or tenderness Heart: regular rate and rhythm, S1, S2 normal, no murmur, click, rub or gallop Abdomen: soft, non-tender; bowel sounds normal; no masses,  no organomegaly Pelvic: not indicated; post-menopausal, no abnormal Pap smears in past Extremities: extremities normal, atraumatic, no cyanosis or edema Pulses: 2+ and symmetric Skin: hyperpigmentation - under breasts Lymph nodes: Cervical, supraclavicular, and axillary nodes normal. Neurologic: Alert and oriented X 3, normal strength and tone. Normal symmetric reflexes. Normal coordination and gait Psych- no depression, no anxiety Tobacco History  Smoking status  . Never Smoker   Smokeless tobacco  . Never Used     Counseling given: Not Answered   Past Medical History  Diagnosis Date  . Hyperlipidemia   . GERD (gastroesophageal reflux disease)    Past Surgical History  Procedure Laterality Date  . Colonoscopy  2 yrs ago  . Total hip arthroplasty  08/10/2011    Procedure: TOTAL HIP ARTHROPLASTY;  Surgeon: Kerin Salen, MD;  Location: [facility];  Service: Orthopedics;  Laterality: Right;  DEPUY/ PENNACLE POLY OR CERAMIC  . Eye surgery Bilateral 2014    cataract   Family History  Problem Relation Age of Onset  . Heart disease Mother   . Cancer Father 39    brain   History  Sexual Activity  . Sexual Activity:  . Partners: Male    Outpatient Encounter  Prescriptions as of 03/19/2015  Medication Sig  . atorvastatin (LIPITOR) 40 MG tablet TAKE 1 TABLET BY MOUTH EVERY DAY *REPEAT LABS ARE DUE NOW*  . Multiple Vitamins-Minerals (MULTIVITAMIN  ADULT PO) Take 1 tablet by mouth daily.  Marland Kitchen omeprazole (PRILOSEC) 20 MG capsule TAKE ONE CAPSULE BY MOUTH EVERY DAY  . zolpidem (AMBIEN) 5 MG tablet TAKE 1 TABLET BY MOUTH AT BEDTIME AS NEEDED FOR SLEEP  . [DISCONTINUED] zolpidem (AMBIEN) 5 MG tablet TAKE 1 TABLET BY MOUTH AT BEDTIME AS NEEDED FOR SLEEP   No facility-administered encounter medications on file as of 03/19/2015.    Activities of Daily Living In your present state of health, do you have any difficulty performing the following activities: 03/19/2015  Hearing? N  Vision? N  Difficulty concentrating or making decisions? N  Walking or climbing stairs? N  Dressing or bathing? N  Doing errands, shopping? N    Patient Care Team: Rosalita Chessman, DO as PCP - General Iona Beard, MD as Referring Physician (Optometry)    Assessment:    Cpe:  Exercise Activities and Dietary recommendations--- pickle ball    Goals    None     Fall Risk Fall Risk  03/19/2015 09/02/2013  Falls in the past year? No No   Depression Screen PHQ 2/9 Scores 03/19/2015 09/02/2013  PHQ - 2 Score 0 0     Cognitive Testing 30/30 mmse  Immunization History  Administered Date(s) Administered  . Influenza Split 04/18/2011  . Influenza Whole 04/18/2007, 04/15/2008  . Influenza-Unspecified 05/04/2014  . Pneumococcal Polysaccharide-23 09/02/2013  . Zoster 06/25/2008   Screening Tests Health Maintenance  Topic Date Due  . TETANUS/TDAP  12/24/1958  . DEXA SCAN  12/23/2004  . PNA vac Low Risk Adult (2 of 2 - PCV13) 09/03/2014  . INFLUENZA VACCINE  02/02/2015  . MAMMOGRAM  03/20/2015  . COLONOSCOPY  09/03/2018  . ZOSTAVAX  Completed      Plan:    see AVS During the course of the visit the patient was educated and counseled about the following appropriate screening and preventive services:   Vaccines to include Pneumoccal, Influenza, Hepatitis B, Td, Zostavax, HCV  Electrocardiogram  Cardiovascular Disease  Colorectal cancer  screening  Bone density screening  Diabetes screening  Glaucoma screening  Mammography/PAP  Nutrition counseling   Patient Instructions (the written plan) was given to the patient.  1. Insomnia   - zolpidem (AMBIEN) 5 MG tablet; TAKE 1 TABLET BY MOUTH AT BEDTIME AS NEEDED FOR SLEEP  Dispense: 30 tablet; Refill: 0  2. Hyperlipidemia   - Comp Met (CMET) - CBC with Differential/Platelet - Lipid panel - POCT urinalysis dipstick - Microalbumin / creatinine urine ratio  3. Medicare annual wellness visit, subsequent See AVS 4  Flu and pneumonia given  Garnet Koyanagi, DO  03/19/2015

## 2015-03-20 ENCOUNTER — Ambulatory Visit (HOSPITAL_BASED_OUTPATIENT_CLINIC_OR_DEPARTMENT_OTHER)
Admission: RE | Admit: 2015-03-20 | Discharge: 2015-03-20 | Disposition: A | Discharge status: Home / Self Care | Payer: Commercial Managed Care - HMO | Source: Ambulatory Visit | Attending: Family Medicine | Admitting: Family Medicine

## 2015-03-20 DIAGNOSIS — Z1231 Encounter for screening mammogram for malignant neoplasm of breast: Secondary | ICD-10-CM

## 2015-03-20 LAB — URINE CULTURE
Colony Count: NO GROWTH
ORGANISM ID, BACTERIA: NO GROWTH

## 2015-03-24 ENCOUNTER — Telehealth: Payer: Self-pay | Admitting: *Deleted

## 2015-03-24 DIAGNOSIS — R945 Abnormal results of liver function studies: Secondary | ICD-10-CM

## 2015-03-24 DIAGNOSIS — R7989 Other specified abnormal findings of blood chemistry: Secondary | ICD-10-CM

## 2015-03-24 NOTE — Telephone Encounter (Signed)
-----  Message from Rosalita Chessman, DO sent at 03/23/2015  9:04 PM EDT ----- Alk phos going up---  Stop lipitor and recheck hep in 2 weeks

## 2015-03-24 NOTE — Telephone Encounter (Signed)
Called and spoke with the pt and informed her of recent lab results and note.  Pt verbalized understanding.  Pt was scheduled a lab appt on Wed (04-08-15 @ 7:00am) and future lab ordered and sent.//AB/CMA

## 2015-03-27 ENCOUNTER — Ambulatory Visit (HOSPITAL_BASED_OUTPATIENT_CLINIC_OR_DEPARTMENT_OTHER)
Admission: RE | Admit: 2015-03-27 | Discharge: 2015-03-27 | Disposition: A | Discharge status: Home / Self Care | Payer: Commercial Managed Care - HMO | Source: Ambulatory Visit | Attending: Family Medicine | Admitting: Family Medicine

## 2015-03-27 DIAGNOSIS — Z1231 Encounter for screening mammogram for malignant neoplasm of breast: Secondary | ICD-10-CM | POA: Diagnosis not present

## 2015-04-08 ENCOUNTER — Other Ambulatory Visit (INDEPENDENT_AMBULATORY_CARE_PROVIDER_SITE_OTHER): Payer: Commercial Managed Care - HMO

## 2015-04-08 DIAGNOSIS — R945 Abnormal results of liver function studies: Secondary | ICD-10-CM

## 2015-04-08 DIAGNOSIS — R7989 Other specified abnormal findings of blood chemistry: Secondary | ICD-10-CM

## 2015-04-08 LAB — HEPATIC FUNCTION PANEL
ALK PHOS: 151 U/L — AB (ref 39–117)
ALT: 24 U/L (ref 0–35)
AST: 22 U/L (ref 0–37)
Albumin: 4 g/dL (ref 3.5–5.2)
BILIRUBIN DIRECT: 0 mg/dL (ref 0.0–0.3)
BILIRUBIN TOTAL: 0.5 mg/dL (ref 0.2–1.2)
Total Protein: 6.7 g/dL (ref 6.0–8.3)

## 2015-04-15 ENCOUNTER — Telehealth: Payer: Self-pay | Admitting: Family Medicine

## 2015-04-15 NOTE — Telephone Encounter (Signed)
Improved--- recheck 3 months  I spoke with patient and she verbalized understanding, she will continue to hold off on the Lipitor and recheck in 3 months.      KP

## 2015-04-15 NOTE — Telephone Encounter (Signed)
Pt calling for lab results from 04/08/15. She said she isn't sure what to do next bc something had to be rechecked. Please call today at [phone].

## 2015-07-27 ENCOUNTER — Other Ambulatory Visit: Payer: Self-pay | Admitting: Family Medicine

## 2015-07-27 NOTE — Telephone Encounter (Signed)
Last seen and filled on 03/19/15 #30  Please advise    KP

## 2015-07-28 MED ORDER — ZOLPIDEM TARTRATE 5 MG PO TABS: 5.0000 mg | ORAL_TABLET | Freq: Every evening | ORAL | Status: DC | PRN

## 2015-07-28 NOTE — Addendum Note (Signed)
Addended by: Ewing Schlein on: 07/28/2015 08:12 AM   Modules accepted: Orders

## 2015-08-19 ENCOUNTER — Other Ambulatory Visit (INDEPENDENT_AMBULATORY_CARE_PROVIDER_SITE_OTHER): Payer: Commercial Managed Care - HMO

## 2015-08-19 DIAGNOSIS — R7989 Other specified abnormal findings of blood chemistry: Secondary | ICD-10-CM

## 2015-08-19 DIAGNOSIS — R945 Abnormal results of liver function studies: Secondary | ICD-10-CM

## 2015-08-19 LAB — HEPATIC FUNCTION PANEL
ALBUMIN: 4.2 g/dL (ref 3.5–5.2)
ALK PHOS: 93 U/L (ref 39–117)
ALT: 12 U/L (ref 0–35)
AST: 18 U/L (ref 0–37)
Bilirubin, Direct: 0.1 mg/dL (ref 0.0–0.3)
Total Bilirubin: 0.4 mg/dL (ref 0.2–1.2)
Total Protein: 6.9 g/dL (ref 6.0–8.3)

## 2015-08-24 ENCOUNTER — Encounter: Payer: Self-pay | Admitting: General Practice

## 2015-09-16 ENCOUNTER — Other Ambulatory Visit: Payer: Commercial Managed Care - HMO

## 2015-09-17 ENCOUNTER — Ambulatory Visit (INDEPENDENT_AMBULATORY_CARE_PROVIDER_SITE_OTHER): Payer: Commercial Managed Care - HMO | Admitting: Family Medicine

## 2015-09-17 ENCOUNTER — Encounter: Payer: Self-pay | Admitting: Family Medicine

## 2015-09-17 VITALS — BP 120/80 | HR 76 | Temp 97.6°F | Ht 65.0 in | Wt 154.4 lb

## 2015-09-17 DIAGNOSIS — B354 Tinea corporis: Secondary | ICD-10-CM | POA: Diagnosis not present

## 2015-09-17 DIAGNOSIS — E785 Hyperlipidemia, unspecified: Secondary | ICD-10-CM | POA: Diagnosis not present

## 2015-09-17 DIAGNOSIS — M1712 Unilateral primary osteoarthritis, left knee: Secondary | ICD-10-CM

## 2015-09-17 DIAGNOSIS — G4700 Insomnia, unspecified: Secondary | ICD-10-CM | POA: Diagnosis not present

## 2015-09-17 DIAGNOSIS — E2839 Other primary ovarian failure: Secondary | ICD-10-CM | POA: Diagnosis not present

## 2015-09-17 LAB — COMPREHENSIVE METABOLIC PANEL
ALBUMIN: 4.1 g/dL (ref 3.5–5.2)
ALK PHOS: 114 U/L (ref 39–117)
ALT: 15 U/L (ref 0–35)
AST: 20 U/L (ref 0–37)
BUN: 24 mg/dL — AB (ref 6–23)
CALCIUM: 9.4 mg/dL (ref 8.4–10.5)
CO2: 30 mEq/L (ref 19–32)
Chloride: 103 mEq/L (ref 96–112)
Creatinine, Ser: 0.72 mg/dL (ref 0.40–1.20)
GFR: 83.76 mL/min (ref >60.00)
Glucose, Bld: 89 mg/dL (ref 70–99)
POTASSIUM: 4.2 meq/L (ref 3.5–5.1)
SODIUM: 139 meq/L (ref 135–145)
TOTAL PROTEIN: 6.9 g/dL (ref 6.0–8.3)
Total Bilirubin: 0.4 mg/dL (ref 0.2–1.2)

## 2015-09-17 LAB — LIPID PANEL
CHOL/HDL RATIO: 5
CHOLESTEROL: 305 mg/dL — AB (ref 0–200)
HDL: 57.4 mg/dL (ref >39.00)
NonHDL: 247.69
Triglycerides: 232 mg/dL — ABNORMAL HIGH (ref 0.0–149.0)
VLDL: 46.4 mg/dL — ABNORMAL HIGH (ref 0.0–40.0)

## 2015-09-17 LAB — LDL CHOLESTEROL, DIRECT: LDL DIRECT: 208 mg/dL

## 2015-09-17 MED ORDER — DICLOFENAC SODIUM 1 % TD GEL: 4.0000 g | Freq: Four times a day (QID) | TRANSDERMAL | Status: DC

## 2015-09-17 MED ORDER — NYSTATIN 100000 UNIT/GM EX POWD: Freq: Four times a day (QID) | CUTANEOUS | Status: DC

## 2015-09-17 MED ORDER — ZOLPIDEM TARTRATE 5 MG PO TABS: 5.0000 mg | ORAL_TABLET | Freq: Every evening | ORAL | Status: DC | PRN

## 2015-09-17 NOTE — Assessment & Plan Note (Signed)
Off meds right now-- recheck labs and will decide what to do from there

## 2015-09-17 NOTE — Progress Notes (Signed)
Patient ID: Joanna Williams, female    DOB: [birth year]  Age: 75 y.o. MRN: [id number]    Subjective:  Subjective HPI Joanna Williams presents for f/u cholesterol off Lipitor.  We had to stop it because alk phos was elevated --- repeat off lipitor was normal.  Pt with no complaints.   Review of Systems  Constitutional: Negative for diaphoresis, appetite change, fatigue and unexpected weight change.  Eyes: Negative for pain, redness and visual disturbance.  Respiratory: Positive for cough. Negative for chest tightness, shortness of breath and wheezing.   Cardiovascular: Negative for chest pain, palpitations and leg swelling.  Endocrine: Negative for cold intolerance, heat intolerance, polydipsia, polyphagia and polyuria.  Genitourinary: Negative for dysuria, frequency and difficulty urinating.  Neurological: Negative for dizziness, light-headedness, numbness and headaches.  Psychiatric/Behavioral: Negative for dysphoric mood. The patient is not nervous/anxious.     History Past Medical History  Diagnosis Date  . Hyperlipidemia   . GERD (gastroesophageal reflux disease)     She has past surgical history that includes Colonoscopy (2 yrs ago); Total hip arthroplasty (08/10/2011); and Eye surgery (Bilateral, 2014).   Her family history includes Cancer (age of onset: 58) in her father; Heart disease in her mother.She reports that she has never smoked. She has never used smokeless tobacco. She reports that she drinks alcohol. She reports that she uses illicit drugs (Hydrocodone).  Current Outpatient Prescriptions on File Prior to Visit  Medication Sig Dispense Refill  . Fish Oil-Cholecalciferol ([facility] FISH OIL + D3) 1200-1000 MG-UNIT CAPS 2 po qd 90 capsule   . Multiple Vitamins-Minerals (MULTIVITAMIN ADULT PO) Take 1 tablet by mouth daily.    Marland Kitchen omeprazole (PRILOSEC) 20 MG capsule TAKE ONE CAPSULE BY MOUTH EVERY DAY 90 capsule 3  . atorvastatin (LIPITOR) 40 MG tablet TAKE 1 TABLET BY MOUTH  EVERY DAY *REPEAT LABS ARE DUE NOW* (Patient not taking: Reported on 09/17/2015) 30 tablet 1   No current facility-administered medications on file prior to visit.     Objective:  Objective Physical Exam  Constitutional: She is oriented to person, place, and time. She appears well-developed and well-nourished.  HENT:  Head: Normocephalic and atraumatic.  Eyes: Conjunctivae and EOM are normal.  Neck: Normal range of motion. Neck supple. No JVD present. Carotid bruit is not present. No thyromegaly present.  Cardiovascular: Normal rate, regular rhythm and normal heart sounds.   No murmur heard. Pulmonary/Chest: Effort normal and breath sounds normal. No respiratory distress. She has no wheezes. She has no rales. She exhibits no tenderness.  Musculoskeletal: She exhibits no edema.  Neurological: She is alert and oriented to person, place, and time.  Psychiatric: She has a normal mood and affect.  Nursing note and vitals reviewed.  BP 120/80 mmHg  Pulse 76  Temp(Src) 97.6 F (36.4 C) (Oral)  Ht '5\' 5"'$  (1.651 m)  Wt 154 lb 6.4 oz (70.035 kg)  BMI 25.69 kg/m2  SpO2 96% Wt Readings from Last 3 Encounters:  09/17/15 154 lb 6.4 oz (70.035 kg)  03/19/15 151 lb 12.8 oz (68.856 kg)  05/12/14 152 lb 9.6 oz (69.219 kg)     Lab Results  Component Value Date   WBC 6.4 03/19/2015   HGB 14.7 03/19/2015   HCT 43.8 03/19/2015   PLT 243.0 03/19/2015   GLUCOSE 92 03/19/2015   CHOL 177 03/19/2015   TRIG 93.0 03/19/2015   HDL 63.00 03/19/2015   LDLDIRECT 142.1 08/13/2012   LDLCALC 96 03/19/2015   ALT 12 08/19/2015  AST 18 08/19/2015   NA 141 03/19/2015   K 4.5 03/19/2015   CL 105 03/19/2015   CREATININE 0.67 03/19/2015   BUN 17 03/19/2015   CO2 32 03/19/2015   TSH 2.34 06/15/2010   INR 1.50* 08/13/2011   HGBA1C 5.8 08/13/2012   MICROALBUR <0.7 03/19/2015    Mm Digital Screening Bilateral  03/30/2015  CLINICAL DATA:  Screening. EXAM: DIGITAL SCREENING BILATERAL MAMMOGRAM WITH CAD  COMPARISON:  Previous exam(s). ACR Breast Density Category b: There are scattered areas of fibroglandular density. FINDINGS: There are no findings suspicious for malignancy. Images were processed with CAD. IMPRESSION: No mammographic evidence of malignancy. A result letter of this screening mammogram will be mailed directly to the patient. RECOMMENDATION: Screening mammogram in one year. (Code:SM-B-01Y) BI-RADS CATEGORY  1: Negative. Electronically Signed   By: Fidela Salisbury M.D.   On: 03/30/2015 09:39     Assessment & Plan:  Plan I am having Ms. Murtagh start on diclofenac sodium. I am also having her maintain her omeprazole, atorvastatin, Multiple Vitamins-Minerals (MULTIVITAMIN ADULT PO), [employer] FISH OIL + D3, zolpidem, and nystatin.  Meds ordered this encounter  Medications  . zolpidem (AMBIEN) 5 MG tablet    Sig: Take 1 tablet (5 mg total) by mouth at bedtime as needed. for sleep    Dispense:  30 tablet    Refill:  0  . nystatin (MYCOSTATIN) powder    Sig: Apply topically 4 (four) times daily.    Dispense:  30 g    Refill:  2  . diclofenac sodium (VOLTAREN) 1 % GEL    Sig: Apply 4 g topically 4 (four) times daily.    Dispense:  100 g    Refill:  2    Problem List Items Addressed This Visit    Hyperlipidemia - Primary    Off meds right now-- recheck labs and will decide what to do from there      Relevant Orders   Comp Met (CMET)   Lipid panel    Other Visit Diagnoses    Estrogen deficiency        Relevant Orders    DG Bone Density    Insomnia        Relevant Medications    zolpidem (AMBIEN) 5 MG tablet    Tinea corporis        Relevant Medications    nystatin (MYCOSTATIN) powder    Primary osteoarthritis of left knee        Relevant Medications    diclofenac sodium (VOLTAREN) 1 % GEL       Follow-up: Return in about 6 months (around 03/19/2016), or if symptoms worsen or fail to improve, for hypertension, hyperlipidemia.  Garnet Koyanagi, DO

## 2015-09-17 NOTE — Progress Notes (Signed)
Pre visit review using our clinic review tool, if applicable. No additional management support is needed unless otherwise documented below in the visit note. 

## 2015-09-17 NOTE — Patient Instructions (Signed)

## 2015-09-29 ENCOUNTER — Other Ambulatory Visit: Payer: Self-pay

## 2015-09-29 MED ORDER — ATORVASTATIN CALCIUM 10 MG PO TABS: 10.0000 mg | ORAL_TABLET | Freq: Every day | ORAL | Status: DC

## 2015-10-22 ENCOUNTER — Other Ambulatory Visit: Payer: Self-pay | Admitting: Family Medicine

## 2016-01-12 DIAGNOSIS — H524 Presbyopia: Secondary | ICD-10-CM | POA: Diagnosis not present

## 2016-01-12 DIAGNOSIS — H5210 Myopia, unspecified eye: Secondary | ICD-10-CM | POA: Diagnosis not present

## 2016-02-15 ENCOUNTER — Other Ambulatory Visit: Payer: Self-pay | Admitting: Family Medicine

## 2016-02-15 DIAGNOSIS — G4700 Insomnia, unspecified: Secondary | ICD-10-CM

## 2016-02-15 NOTE — Telephone Encounter (Signed)
Last seen 09/17/15  Last filled 09/17/15 #30 please advise

## 2016-02-15 NOTE — Telephone Encounter (Signed)
Rx faxed   PC

## 2016-03-15 ENCOUNTER — Other Ambulatory Visit: Payer: Self-pay | Admitting: Family Medicine

## 2016-03-15 DIAGNOSIS — Z1231 Encounter for screening mammogram for malignant neoplasm of breast: Secondary | ICD-10-CM

## 2016-03-28 ENCOUNTER — Ambulatory Visit (HOSPITAL_BASED_OUTPATIENT_CLINIC_OR_DEPARTMENT_OTHER)
Admission: RE | Admit: 2016-03-28 | Discharge: 2016-03-28 | Disposition: A | Discharge status: Home / Self Care | Payer: Commercial Managed Care - HMO | Source: Ambulatory Visit | Attending: Family Medicine | Admitting: Family Medicine

## 2016-03-28 ENCOUNTER — Ambulatory Visit (HOSPITAL_BASED_OUTPATIENT_CLINIC_OR_DEPARTMENT_OTHER): Payer: Commercial Managed Care - HMO

## 2016-03-28 DIAGNOSIS — Z13820 Encounter for screening for osteoporosis: Secondary | ICD-10-CM | POA: Insufficient documentation

## 2016-03-28 DIAGNOSIS — Z87891 Personal history of nicotine dependence: Secondary | ICD-10-CM | POA: Insufficient documentation

## 2016-03-28 DIAGNOSIS — Z780 Asymptomatic menopausal state: Secondary | ICD-10-CM | POA: Diagnosis not present

## 2016-03-28 DIAGNOSIS — Z96641 Presence of right artificial hip joint: Secondary | ICD-10-CM | POA: Insufficient documentation

## 2016-03-28 DIAGNOSIS — E2839 Other primary ovarian failure: Secondary | ICD-10-CM | POA: Insufficient documentation

## 2016-03-28 DIAGNOSIS — M810 Age-related osteoporosis without current pathological fracture: Secondary | ICD-10-CM | POA: Diagnosis not present

## 2016-03-30 ENCOUNTER — Other Ambulatory Visit: Payer: Self-pay

## 2016-03-30 MED ORDER — ALENDRONATE SODIUM 70 MG PO TABS: 70.0000 mg | ORAL_TABLET | ORAL | 11 refills | Status: DC

## 2016-04-04 ENCOUNTER — Ambulatory Visit (HOSPITAL_BASED_OUTPATIENT_CLINIC_OR_DEPARTMENT_OTHER)
Admission: RE | Admit: 2016-04-04 | Discharge: 2016-04-04 | Disposition: A | Discharge status: Home / Self Care | Payer: Commercial Managed Care - HMO | Source: Ambulatory Visit | Attending: Family Medicine | Admitting: Family Medicine

## 2016-04-04 DIAGNOSIS — Z1231 Encounter for screening mammogram for malignant neoplasm of breast: Secondary | ICD-10-CM

## 2016-04-23 ENCOUNTER — Other Ambulatory Visit: Payer: Self-pay | Admitting: Family Medicine

## 2016-05-03 ENCOUNTER — Ambulatory Visit (INDEPENDENT_AMBULATORY_CARE_PROVIDER_SITE_OTHER): Payer: Commercial Managed Care - HMO | Admitting: Family Medicine

## 2016-05-03 ENCOUNTER — Encounter: Payer: Self-pay | Admitting: Family Medicine

## 2016-05-03 VITALS — BP 120/70 | HR 75 | Temp 98.2°F | Resp 16 | Ht 66.0 in | Wt 146.4 lb

## 2016-05-03 DIAGNOSIS — Z23 Encounter for immunization: Secondary | ICD-10-CM | POA: Diagnosis not present

## 2016-05-03 DIAGNOSIS — Z0000 Encounter for general adult medical examination without abnormal findings: Secondary | ICD-10-CM | POA: Diagnosis not present

## 2016-05-03 DIAGNOSIS — G4700 Insomnia, unspecified: Secondary | ICD-10-CM | POA: Diagnosis not present

## 2016-05-03 DIAGNOSIS — E785 Hyperlipidemia, unspecified: Secondary | ICD-10-CM | POA: Diagnosis not present

## 2016-05-03 MED ORDER — ZOLPIDEM TARTRATE 5 MG PO TABS: 5.0000 mg | ORAL_TABLET | Freq: Every evening | ORAL | 0 refills | Status: DC | PRN

## 2016-05-03 NOTE — Progress Notes (Signed)
Pre visit review using our clinic review tool, if applicable. No additional management support is needed unless otherwise documented below in the visit note. 

## 2016-05-03 NOTE — Patient Instructions (Signed)
Preventive Care for Adults, Female A healthy lifestyle and preventive care can promote health and wellness. Preventive health guidelines for women include the following key practices.  A routine yearly physical is a good way to check with your health care provider about your health and preventive screening. It is a chance to share any concerns and updates on your health and to receive a thorough exam.  Visit your dentist for a routine exam and preventive care every 6 months. Brush your teeth twice a day and floss once a day. Good oral hygiene prevents tooth decay and gum disease.  The frequency of eye exams is based on your age, health, family medical history, use of contact lenses, and other factors. Follow your health care provider's recommendations for frequency of eye exams.  Eat a healthy diet. Foods like vegetables, fruits, whole grains, low-fat dairy products, and lean protein foods contain the nutrients you need without too many calories. Decrease your intake of foods high in solid fats, added sugars, and salt. Eat the right amount of calories for you.Get information about a proper diet from your health care provider, if necessary.  Regular physical exercise is one of the most important things you can do for your health. Most adults should get at least 150 minutes of moderate-intensity exercise (any activity that increases your heart rate and causes you to sweat) each week. In addition, most adults need muscle-strengthening exercises on 2 or more days a week.  Maintain a healthy weight. The body mass index (BMI) is a screening tool to identify possible weight problems. It provides an estimate of body fat based on height and weight. Your health care provider can find your BMI and can help you achieve or maintain a healthy weight.For adults 20 years and older:  A BMI below 18.5 is considered underweight.  A BMI of 18.5 to 24.9 is normal.  A BMI of 25 to 29.9 is considered overweight.  A  BMI of 30 and above is considered obese.  Maintain normal blood lipids and cholesterol levels by exercising and minimizing your intake of saturated fat. Eat a balanced diet with plenty of fruit and vegetables. Blood tests for lipids and cholesterol should begin at age 45 and be repeated every 5 years. If your lipid or cholesterol levels are high, you are over 50, or you are at high risk for heart disease, you may need your cholesterol levels checked more frequently.Ongoing high lipid and cholesterol levels should be treated with medicines if diet and exercise are not working.  If you smoke, find out from your health care provider how to quit. If you do not use tobacco, do not start.  Lung cancer screening is recommended for adults aged 45-80 years who are at high risk for developing lung cancer because of a history of smoking. A yearly low-dose CT scan of the lungs is recommended for people who have at least a 30-pack-year history of smoking and are a current smoker or have quit within the past 15 years. A pack year of smoking is smoking an average of 1 pack of cigarettes a day for 1 year (for example: 1 pack a day for 30 years or 2 packs a day for 15 years). Yearly screening should continue until the smoker has stopped smoking for at least 15 years. Yearly screening should be stopped for people who develop a health problem that would prevent them from having lung cancer treatment.  If you are pregnant, do not drink alcohol. If you are  breastfeeding, be very cautious about drinking alcohol. If you are not pregnant and choose to drink alcohol, do not have more than 1 drink per day. One drink is considered to be 12 ounces (355 mL) of beer, 5 ounces (148 mL) of wine, or 1.5 ounces (44 mL) of liquor.  Avoid use of street drugs. Do not share needles with anyone. Ask for help if you need support or instructions about stopping the use of drugs.  High blood pressure causes heart disease and increases the risk  of stroke. Your blood pressure should be checked at least every 1 to 2 years. Ongoing high blood pressure should be treated with medicines if weight loss and exercise do not work.  If you are 55-79 years old, ask your health care provider if you should take aspirin to prevent strokes.  Diabetes screening is done by taking a blood sample to check your blood glucose level after you have not eaten for a certain period of time (fasting). If you are not overweight and you do not have risk factors for diabetes, you should be screened once every 3 years starting at age 45. If you are overweight or obese and you are 40-70 years of age, you should be screened for diabetes every year as part of your cardiovascular risk assessment.  Breast cancer screening is essential preventive care for women. You should practice "breast self-awareness." This means understanding the normal appearance and feel of your breasts and may include breast self-examination. Any changes detected, no matter how small, should be reported to a health care provider. Women in their 20s and 30s should have a clinical breast exam (CBE) by a health care provider as part of a regular health exam every 1 to 3 years. After age 40, women should have a CBE every year. Starting at age 40, women should consider having a mammogram (breast X-ray test) every year. Women who have a family history of breast cancer should talk to their health care provider about genetic screening. Women at a high risk of breast cancer should talk to their health care providers about having an MRI and a mammogram every year.  Breast cancer gene (BRCA)-related cancer risk assessment is recommended for women who have family members with BRCA-related cancers. BRCA-related cancers include breast, ovarian, tubal, and peritoneal cancers. Having family members with these cancers may be associated with an increased risk for harmful changes (mutations) in the breast cancer genes BRCA1 and  BRCA2. Results of the assessment will determine the need for genetic counseling and BRCA1 and BRCA2 testing.  Your health care provider may recommend that you be screened regularly for cancer of the pelvic organs (ovaries, uterus, and vagina). This screening involves a pelvic examination, including checking for microscopic changes to the surface of your cervix (Pap test). You may be encouraged to have this screening done every 3 years, beginning at age 21.  For women ages 30-65, health care providers may recommend pelvic exams and Pap testing every 3 years, or they may recommend the Pap and pelvic exam, combined with testing for human papilloma virus (HPV), every 5 years. Some types of HPV increase your risk of cervical cancer. Testing for HPV may also be done on women of any age with unclear Pap test results.  Other health care providers may not recommend any screening for nonpregnant women who are considered low risk for pelvic cancer and who do not have symptoms. Ask your health care provider if a screening pelvic exam is right for   you.  If you have had past treatment for cervical cancer or a condition that could lead to cancer, you need Pap tests and screening for cancer for at least 20 years after your treatment. If Pap tests have been discontinued, your risk factors (such as having a new sexual partner) need to be reassessed to determine if screening should resume. Some women have medical problems that increase the chance of getting cervical cancer. In these cases, your health care provider may recommend more frequent screening and Pap tests.  Colorectal cancer can be detected and often prevented. Most routine colorectal cancer screening begins at the age of 50 years and continues through age 75 years. However, your health care provider may recommend screening at an earlier age if you have risk factors for colon cancer. On a yearly basis, your health care provider may provide home test kits to check  for hidden blood in the stool. Use of a small camera at the end of a tube, to directly examine the colon (sigmoidoscopy or colonoscopy), can detect the earliest forms of colorectal cancer. Talk to your health care provider about this at age 50, when routine screening begins. Direct exam of the colon should be repeated every 5-10 years through age 75 years, unless early forms of precancerous polyps or small growths are found.  People who are at an increased risk for hepatitis B should be screened for this virus. You are considered at high risk for hepatitis B if:  You were born in a country where hepatitis B occurs often. Talk with your health care provider about which countries are considered high risk.  Your parents were born in a high-risk country and you have not received a shot to protect against hepatitis B (hepatitis B vaccine).  You have HIV or AIDS.  You use needles to inject street drugs.  You live with, or have sex with, someone who has hepatitis B.  You get hemodialysis treatment.  You take certain medicines for conditions like cancer, organ transplantation, and autoimmune conditions.  Hepatitis C blood testing is recommended for all people born from 1945 through 1965 and any individual with known risks for hepatitis C.  Practice safe sex. Use condoms and avoid high-risk sexual practices to reduce the spread of sexually transmitted infections (STIs). STIs include gonorrhea, chlamydia, syphilis, trichomonas, herpes, HPV, and human immunodeficiency virus (HIV). Herpes, HIV, and HPV are viral illnesses that have no cure. They can result in disability, cancer, and death.  You should be screened for sexually transmitted illnesses (STIs) including gonorrhea and chlamydia if:  You are sexually active and are younger than 24 years.  You are older than 24 years and your health care provider tells you that you are at risk for this type of infection.  Your sexual activity has changed  since you were last screened and you are at an increased risk for chlamydia or gonorrhea. Ask your health care provider if you are at risk.  If you are at risk of being infected with HIV, it is recommended that you take a prescription medicine daily to prevent HIV infection. This is called preexposure prophylaxis (PrEP). You are considered at risk if:  You are sexually active and do not regularly use condoms or know the HIV status of your partner(s).  You take drugs by injection.  You are sexually active with a partner who has HIV.  Talk with your health care provider about whether you are at high risk of being infected with HIV. If   you choose to begin PrEP, you should first be tested for HIV. You should then be tested every 3 months for as long as you are taking PrEP.  Osteoporosis is a disease in which the bones lose minerals and strength with aging. This can result in serious bone fractures or breaks. The risk of osteoporosis can be identified using a bone density scan. Women ages 67 years and over and women at risk for fractures or osteoporosis should discuss screening with their health care providers. Ask your health care provider whether you should take a calcium supplement or vitamin D to reduce the rate of osteoporosis.  Menopause can be associated with physical symptoms and risks. Hormone replacement therapy is available to decrease symptoms and risks. You should talk to your health care provider about whether hormone replacement therapy is right for you.  Use sunscreen. Apply sunscreen liberally and repeatedly throughout the day. You should seek shade when your shadow is shorter than you. Protect yourself by wearing long sleeves, pants, a wide-brimmed hat, and sunglasses year round, whenever you are outdoors.  Once a month, do a whole body skin exam, using a mirror to look at the skin on your back. Tell your health care provider of new moles, moles that have irregular borders, moles that  are larger than a pencil eraser, or moles that have changed in shape or color.  Stay current with required vaccines (immunizations).  Influenza vaccine. All adults should be immunized every year.  Tetanus, diphtheria, and acellular pertussis (Td, Tdap) vaccine. Pregnant women should receive 1 dose of Tdap vaccine during each pregnancy. The dose should be obtained regardless of the length of time since the last dose. Immunization is preferred during the 27th-36th week of gestation. An adult who has not previously received Tdap or who does not know her vaccine status should receive 1 dose of Tdap. This initial dose should be followed by tetanus and diphtheria toxoids (Td) booster doses every 10 years. Adults with an unknown or incomplete history of completing a 3-dose immunization series with Td-containing vaccines should begin or complete a primary immunization series including a Tdap dose. Adults should receive a Td booster every 10 years.  Varicella vaccine. An adult without evidence of immunity to varicella should receive 2 doses or a second dose if she has previously received 1 dose. Pregnant females who do not have evidence of immunity should receive the first dose after pregnancy. This first dose should be obtained before leaving the health care facility. The second dose should be obtained 4-8 weeks after the first dose.  Human papillomavirus (HPV) vaccine. Females aged 13-26 years who have not received the vaccine previously should obtain the 3-dose series. The vaccine is not recommended for use in pregnant females. However, pregnancy testing is not needed before receiving a dose. If a female is found to be pregnant after receiving a dose, no treatment is needed. In that case, the remaining doses should be delayed until after the pregnancy. Immunization is recommended for any person with an immunocompromised condition through the age of 61 years if she did not get any or all doses earlier. During the  3-dose series, the second dose should be obtained 4-8 weeks after the first dose. The third dose should be obtained 24 weeks after the first dose and 16 weeks after the second dose.  Zoster vaccine. One dose is recommended for adults aged 30 years or older unless certain conditions are present.  Measles, mumps, and rubella (MMR) vaccine. Adults born  before 1957 generally are considered immune to measles and mumps. Adults born in 1957 or later should have 1 or more doses of MMR vaccine unless there is a contraindication to the vaccine or there is laboratory evidence of immunity to each of the three diseases. A routine second dose of MMR vaccine should be obtained at least 28 days after the first dose for students attending postsecondary schools, health care workers, or international travelers. People who received inactivated measles vaccine or an unknown type of measles vaccine during 1963-1967 should receive 2 doses of MMR vaccine. People who received inactivated mumps vaccine or an unknown type of mumps vaccine before 1979 and are at high risk for mumps infection should consider immunization with 2 doses of MMR vaccine. For females of childbearing age, rubella immunity should be determined. If there is no evidence of immunity, females who are not pregnant should be vaccinated. If there is no evidence of immunity, females who are pregnant should delay immunization until after pregnancy. Unvaccinated health care workers born before 1957 who lack laboratory evidence of measles, mumps, or rubella immunity or laboratory confirmation of disease should consider measles and mumps immunization with 2 doses of MMR vaccine or rubella immunization with 1 dose of MMR vaccine.  Pneumococcal 13-valent conjugate (PCV13) vaccine. When indicated, a person who is uncertain of his immunization history and has no record of immunization should receive the PCV13 vaccine. All adults 65 years of age and older should receive this  vaccine. An adult aged 19 years or older who has certain medical conditions and has not been previously immunized should receive 1 dose of PCV13 vaccine. This PCV13 should be followed with a dose of pneumococcal polysaccharide (PPSV23) vaccine. Adults who are at high risk for pneumococcal disease should obtain the PPSV23 vaccine at least 8 weeks after the dose of PCV13 vaccine. Adults older than 76 years of age who have normal immune system function should obtain the PPSV23 vaccine dose at least 1 year after the dose of PCV13 vaccine.  Pneumococcal polysaccharide (PPSV23) vaccine. When PCV13 is also indicated, PCV13 should be obtained first. All adults aged 65 years and older should be immunized. An adult younger than age 65 years who has certain medical conditions should be immunized. Any person who resides in a nursing home or long-term care facility should be immunized. An adult smoker should be immunized. People with an immunocompromised condition and certain other conditions should receive both PCV13 and PPSV23 vaccines. People with human immunodeficiency virus (HIV) infection should be immunized as soon as possible after diagnosis. Immunization during chemotherapy or radiation therapy should be avoided. Routine use of PPSV23 vaccine is not recommended for American Indians, [place] Natives, or people younger than 65 years unless there are medical conditions that require PPSV23 vaccine. When indicated, people who have unknown immunization and have no record of immunization should receive PPSV23 vaccine. One-time revaccination 5 years after the first dose of PPSV23 is recommended for people aged 19-64 years who have chronic kidney failure, nephrotic syndrome, asplenia, or immunocompromised conditions. People who received 1-2 doses of PPSV23 before age 65 years should receive another dose of PPSV23 vaccine at age 65 years or later if at least 5 years have passed since the previous dose. Doses of PPSV23 are not  needed for people immunized with PPSV23 at or after age 65 years.  Meningococcal vaccine. Adults with asplenia or persistent complement component deficiencies should receive 2 doses of quadrivalent meningococcal conjugate (MenACWY-D) vaccine. The doses should be obtained   at least 2 months apart. Microbiologists working with certain meningococcal bacteria, [place] recruits, people at risk during an outbreak, and people who travel to or live in countries with a high rate of meningitis should be immunized. A first-year college student up through age 34 years who is living in a residence hall should receive a dose if she did not receive a dose on or after her 16th birthday. Adults who have certain high-risk conditions should receive one or more doses of vaccine.  Hepatitis A vaccine. Adults who wish to be protected from this disease, have certain high-risk conditions, work with hepatitis A-infected animals, work in hepatitis A research labs, or travel to or work in countries with a high rate of hepatitis A should be immunized. Adults who were previously unvaccinated and who anticipate close contact with an international adoptee during the first 60 days after arrival in the [place] from a country with a high rate of hepatitis A should be immunized.  Hepatitis B vaccine. Adults who wish to be protected from this disease, have certain high-risk conditions, may be exposed to blood or other infectious body fluids, are household contacts or sex partners of hepatitis B positive people, are clients or workers in certain care facilities, or travel to or work in countries with a high rate of hepatitis B should be immunized.  Haemophilus influenzae type b (Hib) vaccine. A previously unvaccinated person with asplenia or sickle cell disease or having a scheduled splenectomy should receive 1 dose of Hib vaccine. Regardless of previous immunization, a recipient of a hematopoietic stem cell transplant should receive a  3-dose series 6-12 months after her successful transplant. Hib vaccine is not recommended for adults with HIV infection. Preventive Services / Frequency Ages 35 to 4 years  Blood pressure check.** / Every 3-5 years.  Lipid and cholesterol check.** / Every 5 years beginning at age 60.  Clinical breast exam.** / Every 3 years for women in their 71s and 10s.  BRCA-related cancer risk assessment.** / For women who have family members with a BRCA-related cancer (breast, ovarian, tubal, or peritoneal cancers).  Pap test.** / Every 2 years from ages 76 through 26. Every 3 years starting at age 61 through age 76 or 93 with a history of 3 consecutive normal Pap tests.  HPV screening.** / Every 3 years from ages 37 through ages 60 to 51 with a history of 3 consecutive normal Pap tests.  Hepatitis C blood test.** / For any individual with known risks for hepatitis C.  Skin self-exam. / Monthly.  Influenza vaccine. / Every year.  Tetanus, diphtheria, and acellular pertussis (Tdap, Td) vaccine.** / Consult your health care provider. Pregnant women should receive 1 dose of Tdap vaccine during each pregnancy. 1 dose of Td every 10 years.  Varicella vaccine.** / Consult your health care provider. Pregnant females who do not have evidence of immunity should receive the first dose after pregnancy.  HPV vaccine. / 3 doses over 6 months, if 93 and younger. The vaccine is not recommended for use in pregnant females. However, pregnancy testing is not needed before receiving a dose.  Measles, mumps, rubella (MMR) vaccine.** / You need at least 1 dose of MMR if you were born in 1957 or later. You may also need a 2nd dose. For females of childbearing age, rubella immunity should be determined. If there is no evidence of immunity, females who are not pregnant should be vaccinated. If there is no evidence of immunity, females who are  pregnant should delay immunization until after pregnancy.  Pneumococcal  13-valent conjugate (PCV13) vaccine.** / Consult your health care provider.  Pneumococcal polysaccharide (PPSV23) vaccine.** / 1 to 2 doses if you smoke cigarettes or if you have certain conditions.  Meningococcal vaccine.** / 1 dose if you are age 68 to 8 years and a Market researcher living in a residence hall, or have one of several medical conditions, you need to get vaccinated against meningococcal disease. You may also need additional booster doses.  Hepatitis A vaccine.** / Consult your health care provider.  Hepatitis B vaccine.** / Consult your health care provider.  Haemophilus influenzae type b (Hib) vaccine.** / Consult your health care provider. Ages 7 to 53 years  Blood pressure check.** / Every year.  Lipid and cholesterol check.** / Every 5 years beginning at age 25 years.  Lung cancer screening. / Every year if you are aged 11-80 years and have a 30-pack-year history of smoking and currently smoke or have quit within the past 15 years. Yearly screening is stopped once you have quit smoking for at least 15 years or develop a health problem that would prevent you from having lung cancer treatment.  Clinical breast exam.** / Every year after age 48 years.  BRCA-related cancer risk assessment.** / For women who have family members with a BRCA-related cancer (breast, ovarian, tubal, or peritoneal cancers).  Mammogram.** / Every year beginning at age 41 years and continuing for as long as you are in good health. Consult with your health care provider.  Pap test.** / Every 3 years starting at age 65 years through age 37 or 70 years with a history of 3 consecutive normal Pap tests.  HPV screening.** / Every 3 years from ages 72 years through ages 60 to 40 years with a history of 3 consecutive normal Pap tests.  Fecal occult blood test (FOBT) of stool. / Every year beginning at age 21 years and continuing until age 5 years. You may not need to do this test if you get  a colonoscopy every 10 years.  Flexible sigmoidoscopy or colonoscopy.** / Every 5 years for a flexible sigmoidoscopy or every 10 years for a colonoscopy beginning at age 35 years and continuing until age 48 years.  Hepatitis C blood test.** / For all people born from [birth year] through 1965 and any individual with known risks for hepatitis C.  Skin self-exam. / Monthly.  Influenza vaccine. / Every year.  Tetanus, diphtheria, and acellular pertussis (Tdap/Td) vaccine.** / Consult your health care provider. Pregnant women should receive 1 dose of Tdap vaccine during each pregnancy. 1 dose of Td every 10 years.  Varicella vaccine.** / Consult your health care provider. Pregnant females who do not have evidence of immunity should receive the first dose after pregnancy.  Zoster vaccine.** / 1 dose for adults aged 30 years or older.  Measles, mumps, rubella (MMR) vaccine.** / You need at least 1 dose of MMR if you were born in 1957 or later. You may also need a second dose. For females of childbearing age, rubella immunity should be determined. If there is no evidence of immunity, females who are not pregnant should be vaccinated. If there is no evidence of immunity, females who are pregnant should delay immunization until after pregnancy.  Pneumococcal 13-valent conjugate (PCV13) vaccine.** / Consult your health care provider.  Pneumococcal polysaccharide (PPSV23) vaccine.** / 1 to 2 doses if you smoke cigarettes or if you have certain conditions.  Meningococcal vaccine.** /  Consult your health care provider.  Hepatitis A vaccine.** / Consult your health care provider.  Hepatitis B vaccine.** / Consult your health care provider.  Haemophilus influenzae type b (Hib) vaccine.** / Consult your health care provider. Ages 64 years and over  Blood pressure check.** / Every year.  Lipid and cholesterol check.** / Every 5 years beginning at age 23 years.  Lung cancer screening. / Every year if you  are aged 16-80 years and have a 30-pack-year history of smoking and currently smoke or have quit within the past 15 years. Yearly screening is stopped once you have quit smoking for at least 15 years or develop a health problem that would prevent you from having lung cancer treatment.  Clinical breast exam.** / Every year after age 74 years.  BRCA-related cancer risk assessment.** / For women who have family members with a BRCA-related cancer (breast, ovarian, tubal, or peritoneal cancers).  Mammogram.** / Every year beginning at age 44 years and continuing for as long as you are in good health. Consult with your health care provider.  Pap test.** / Every 3 years starting at age 58 years through age 22 or 39 years with 3 consecutive normal Pap tests. Testing can be stopped between 65 and 70 years with 3 consecutive normal Pap tests and no abnormal Pap or HPV tests in the past 10 years.  HPV screening.** / Every 3 years from ages 64 years through ages 70 or 61 years with a history of 3 consecutive normal Pap tests. Testing can be stopped between 65 and 70 years with 3 consecutive normal Pap tests and no abnormal Pap or HPV tests in the past 10 years.  Fecal occult blood test (FOBT) of stool. / Every year beginning at age 40 years and continuing until age 27 years. You may not need to do this test if you get a colonoscopy every 10 years.  Flexible sigmoidoscopy or colonoscopy.** / Every 5 years for a flexible sigmoidoscopy or every 10 years for a colonoscopy beginning at age 7 years and continuing until age 32 years.  Hepatitis C blood test.** / For all people born from [birth year] through 1965 and any individual with known risks for hepatitis C.  Osteoporosis screening.** / A one-time screening for women ages 30 years and over and women at risk for fractures or osteoporosis.  Skin self-exam. / Monthly.  Influenza vaccine. / Every year.  Tetanus, diphtheria, and acellular pertussis (Tdap/Td)  vaccine.** / 1 dose of Td every 10 years.  Varicella vaccine.** / Consult your health care provider.  Zoster vaccine.** / 1 dose for adults aged 35 years or older.  Pneumococcal 13-valent conjugate (PCV13) vaccine.** / Consult your health care provider.  Pneumococcal polysaccharide (PPSV23) vaccine.** / 1 dose for all adults aged 46 years and older.  Meningococcal vaccine.** / Consult your health care provider.  Hepatitis A vaccine.** / Consult your health care provider.  Hepatitis B vaccine.** / Consult your health care provider.  Haemophilus influenzae type b (Hib) vaccine.** / Consult your health care provider. ** Family history and personal history of risk and conditions may change your health care provider's recommendations.   This information is not intended to replace advice given to you by your health care provider. Make sure you discuss any questions you have with your health care provider.   Document Released: 08/16/2001 Document Revised: 07/11/2014 Document Reviewed: 11/15/2010 Elsevier Interactive Patient Education [employer].

## 2016-05-04 ENCOUNTER — Telehealth: Payer: Self-pay

## 2016-05-04 NOTE — Telephone Encounter (Signed)
Patient has been taking fosmax for a while and would like to try Prolia injection. Per Dr Etter Sjogren, she agree with patient on trying injection.

## 2016-05-04 NOTE — Progress Notes (Signed)
Subjective:   Joanna Williams is a 75 y.o. female who presents for Medicare Annual (Subsequent) preventive examination.  Review of Systems:   Review of Systems  Constitutional: Negative for activity change, appetite change and fatigue.  HENT: Negative for hearing loss, congestion, tinnitus and ear discharge.   Eyes: Negative for visual disturbance (see optho q1y -- vision corrected to 20/20 with glasses).  Respiratory: Negative for cough, chest tightness and shortness of breath.   Cardiovascular: Negative for chest pain, palpitations and leg swelling.  Gastrointestinal: Negative for abdominal pain, diarrhea, constipation and abdominal distention.  Genitourinary: Negative for urgency, frequency, decreased urine volume and difficulty urinating.  Musculoskeletal: Negative for back pain, arthralgias and gait problem.  Skin: Negative for color change, pallor and rash.  Neurological: Negative for dizziness, light-headedness, numbness and headaches.  Hematological: Negative for adenopathy. Does not bruise/bleed easily.  Psychiatric/Behavioral: Negative for suicidal ideas, confusion, sleep disturbance, self-injury, dysphoric mood, decreased concentration and agitation.  Pt is able to read and write and can do all ADLs No risk for falling No abuse/ violence in home   Cardiac Risk Factors include: advanced age (>34men, >66 women)     Objective:     Vitals: BP 120/70 (BP Location: Left Arm, Patient Position: Sitting, Cuff Size: Normal)   Pulse 75   Temp 98.2 F (36.8 C) (Oral)   Resp 16   Ht 5\' 6"  (1.676 m)   Wt 146 lb 6.4 oz (66.4 kg)   SpO2 97%   BMI 23.63 kg/m   Body mass index is 23.63 kg/m. BP 120/70 (BP Location: Left Arm, Patient Position: Sitting, Cuff Size: Normal)   Pulse 75   Temp 98.2 F (36.8 C) (Oral)   Resp 16   Ht 5\' 6"  (1.676 m)   Wt 146 lb 6.4 oz (66.4 kg)   SpO2 97%   BMI 23.63 kg/m  General appearance: alert, cooperative, appears stated age and no  distress Head: Normocephalic, without obvious abnormality, atraumatic Eyes: conjunctivae/corneas clear. PERRL, EOM's intact. Fundi benign. Ears: normal TM's and external ear canals both ears Nose: Nares normal. Septum midline. Mucosa normal. No drainage or sinus tenderness. Throat: lips, mucosa, and tongue normal; teeth and gums normal Neck: no adenopathy, no carotid bruit, no JVD, supple, symmetrical, trachea midline and thyroid not enlarged, symmetric, no tenderness/mass/nodules Back: symmetric, no curvature. ROM normal. No CVA tenderness. Lungs: clear to auscultation bilaterally Breasts: normal appearance, no masses or tenderness Heart: S1, S2 normal Abdomen: soft, non-tender; bowel sounds normal; no masses,  no organomegaly Pelvic: not indicated; post-menopausal, no abnormal Pap smears in past Extremities: extremities normal, atraumatic, no cyanosis or edema Pulses: 2+ and symmetric Skin: Skin color, texture, turgor normal. No rashes or lesions Lymph nodes: Cervical, supraclavicular, and axillary nodes normal. Neurologic: Alert and oriented X 3, normal strength and tone. Normal symmetric reflexes. Normal coordination and gait Tobacco History  Smoking Status  . Never Smoker  Smokeless Tobacco  . Never Used     Counseling given: Not Answered   Past Medical History:  Diagnosis Date  . GERD (gastroesophageal reflux disease)   . Hyperlipidemia    Past Surgical History:  Procedure Laterality Date  . COLONOSCOPY  2 yrs ago  . EYE SURGERY Bilateral 2014   cataract  . TOTAL HIP ARTHROPLASTY  08/10/2011   Procedure: TOTAL HIP ARTHROPLASTY;  Surgeon: Kerin Salen, MD;  Location: [facility];  Service: Orthopedics;  Laterality: Right;  DEPUY/ PENNACLE POLY OR CERAMIC   Family History  Problem  Relation Age of Onset  . Heart disease Mother   . Cancer Father 51    brain   History  Sexual Activity  . Sexual activity: Yes  . Partners: Male    Outpatient Encounter Prescriptions as of  05/03/2016  Medication Sig  . alendronate (FOSAMAX) 70 MG tablet Take 1 tablet (70 mg total) by mouth every 7 (seven) days. Take with a full glass of water on an empty stomach.  Marland Kitchen atorvastatin (LIPITOR) 10 MG tablet TAKE 1 TABLET (10 MG TOTAL) BY MOUTH DAILY.  Marland Kitchen diclofenac sodium (VOLTAREN) 1 % GEL Apply 4 g topically 4 (four) times daily.  . Fish Oil-Cholecalciferol ([facility] FISH OIL + D3) 1200-1000 MG-UNIT CAPS 2 po qd  . Multiple Vitamins-Minerals (MULTIVITAMIN ADULT PO) Take 1 tablet by mouth daily.  Marland Kitchen nystatin (MYCOSTATIN) powder Apply topically 4 (four) times daily.  Marland Kitchen omeprazole (PRILOSEC) 20 MG capsule TAKE ONE CAPSULE BY MOUTH EVERY DAY  . zolpidem (AMBIEN) 5 MG tablet Take 1 tablet (5 mg total) by mouth at bedtime as needed. for sleep  . [DISCONTINUED] zolpidem (AMBIEN) 5 MG tablet TAKE 1 TABLET BY MOUTH AT BEDTIME AS NEEDED FOR SLEEP   No facility-administered encounter medications on file as of 05/03/2016.     Activities of Daily Living In your present state of health, do you have any difficulty performing the following activities: 05/03/2016 05/03/2016  Hearing? N N  Vision? N N  Difficulty concentrating or making decisions? N N  Walking or climbing stairs? N N  Dressing or bathing? N N  Doing errands, shopping? N N  Preparing Food and eating ? N -  Using the Toilet? N -  In the past six months, have you accidently leaked urine? N -  Do you have problems with loss of bowel control? N -  Managing your Medications? N -  Managing your Finances? N -  Housekeeping or managing your Housekeeping? N -  Some recent data might be hidden    Patient Care Team: Ann Held, DO as PCP - General Iona Beard, MD as Referring Physician (Optometry) Iona Beard, MD as Referring Physician (Optometry) Darleen Crocker, MD as Consulting Physician (Ophthalmology)    Assessment:    cpe Exercise Activities and Dietary recommendations Current Exercise Habits: Home exercise routine, Type  of exercise: Other - see comments, Intensity: Moderate, Exercise limited by: None identified  Goals    None     Fall Risk Fall Risk  05/03/2016 05/03/2016 03/19/2015 03/19/2015 09/02/2013  Falls in the past year? No No No No No   Depression Screen PHQ 2/9 Scores 05/03/2016 05/03/2016 03/19/2015 03/19/2015  PHQ - 2 Score 0 0 0 0     Cognitive Function MMSE - Mini Mental State Exam 05/03/2016  Orientation to time 5  Orientation to Place 5  Registration 3  Attention/ Calculation 5  Recall 3  Language- name 2 objects 2  Language- repeat 1  Language- follow 3 step command 3  Language- read & follow direction 1  Write a sentence 1  Copy design 1  Total score 30        Immunization History  Administered Date(s) Administered  . Influenza Split 04/18/2011  . Influenza Whole 04/18/2007, 04/15/2008  . Influenza, High Dose Seasonal PF 03/19/2015, 05/03/2016  . Influenza-Unspecified 05/04/2014  . Pneumococcal Conjugate-13 03/19/2015  . Pneumococcal Polysaccharide-23 09/02/2013  . Zoster 06/25/2008   Screening Tests Health Maintenance  Topic Date Due  . TETANUS/TDAP  12/24/1958  . MAMMOGRAM  04/04/2017  . INFLUENZA VACCINE  Completed  . DEXA SCAN  Completed  . ZOSTAVAX  Completed  . PNA vac Low Risk Adult  Completed      Plan:    see avs During the course of the visit the patient was educated and counseled about the following appropriate screening and preventive services:   Vaccines to include Pneumoccal, Influenza, Hepatitis B, Td, Zostavax, HCV  Electrocardiogram  Cardiovascular Disease  Colorectal cancer screening  Bone density screening  Diabetes screening  Glaucoma screening  Mammography/PAP  Nutrition counseling   Patient Instructions (the written plan) was given to the patient.  1. Insomnia, unspecified type  - zolpidem (AMBIEN) 5 MG tablet; Take 1 tablet (5 mg total) by mouth at bedtime as needed. for sleep  Dispense: 90 tablet; Refill: 0  2.  Preventative health care See above  3. Hyperlipidemia LDL goal <100 Check labs con't lipitor - Lipid panel; Future - CBC with Differential/Platelet; Future - Comprehensive metabolic panel; Future - POCT urinalysis dipstick; Future  4. Encounter for immunization   - Flu vaccine HIGH DOSE PF  Ann Held, DO  05/04/2016

## 2016-05-11 ENCOUNTER — Other Ambulatory Visit (INDEPENDENT_AMBULATORY_CARE_PROVIDER_SITE_OTHER): Payer: Commercial Managed Care - HMO

## 2016-05-11 DIAGNOSIS — Z23 Encounter for immunization: Secondary | ICD-10-CM

## 2016-05-11 DIAGNOSIS — E785 Hyperlipidemia, unspecified: Secondary | ICD-10-CM

## 2016-05-11 LAB — CBC WITH DIFFERENTIAL/PLATELET
BASOS ABS: 0 10*3/uL (ref 0.0–0.1)
BASOS PCT: 0.6 % (ref 0.0–3.0)
EOS PCT: 4.3 % (ref 0.0–5.0)
Eosinophils Absolute: 0.3 10*3/uL (ref 0.0–0.7)
HEMATOCRIT: 41 % (ref 36.0–46.0)
Hemoglobin: 13.9 g/dL (ref 12.0–15.0)
LYMPHS ABS: 2.3 10*3/uL (ref 0.7–4.0)
LYMPHS PCT: 34.1 % (ref 12.0–46.0)
MCHC: 33.8 g/dL (ref 30.0–36.0)
MCV: 92.6 fl (ref 78.0–100.0)
MONOS PCT: 8.5 % (ref 3.0–12.0)
Monocytes Absolute: 0.6 10*3/uL (ref 0.1–1.0)
NEUTROS ABS: 3.5 10*3/uL (ref 1.4–7.7)
NEUTROS PCT: 52.5 % (ref 43.0–77.0)
PLATELETS: 271 10*3/uL (ref 150.0–400.0)
RBC: 4.43 Mil/uL (ref 3.87–5.11)
RDW: 13.4 % (ref 11.5–15.5)
WBC: 6.8 10*3/uL (ref 4.0–10.5)

## 2016-05-11 LAB — COMPREHENSIVE METABOLIC PANEL
ALK PHOS: 78 U/L (ref 39–117)
ALT: 13 U/L (ref 0–35)
AST: 17 U/L (ref 0–37)
Albumin: 4.1 g/dL (ref 3.5–5.2)
BILIRUBIN TOTAL: 0.4 mg/dL (ref 0.2–1.2)
BUN: 21 mg/dL (ref 6–23)
CALCIUM: 9.3 mg/dL (ref 8.4–10.5)
CO2: 31 meq/L (ref 19–32)
Chloride: 105 mEq/L (ref 96–112)
Creatinine, Ser: 0.71 mg/dL (ref 0.40–1.20)
GFR: 84.98 mL/min (ref >60.00)
Glucose, Bld: 90 mg/dL (ref 70–99)
POTASSIUM: 4.2 meq/L (ref 3.5–5.1)
Sodium: 142 mEq/L (ref 135–145)
Total Protein: 6.6 g/dL (ref 6.0–8.3)

## 2016-05-11 LAB — LIPID PANEL
CHOL/HDL RATIO: 4
CHOLESTEROL: 213 mg/dL — AB (ref 0–200)
HDL: 57.7 mg/dL (ref >39.00)
LDL Cholesterol: 129 mg/dL — ABNORMAL HIGH (ref 0–99)
NonHDL: 155.04
TRIGLYCERIDES: 128 mg/dL (ref 0.0–149.0)
VLDL: 25.6 mg/dL (ref 0.0–40.0)

## 2016-05-16 ENCOUNTER — Telehealth: Payer: Self-pay | Admitting: Behavioral Health

## 2016-05-16 DIAGNOSIS — E785 Hyperlipidemia, unspecified: Secondary | ICD-10-CM

## 2016-05-16 NOTE — Telephone Encounter (Addendum)
-----   Message from Ann Held, DO sent at 05/14/2016  6:02 PM EST ----- Cholesterol--- LDL goal < 100,  HDL >40,  TG < 150.  Diet and exercise will increase HDL and decrease LDL and TG.  Fish,  Fish Oil, Flaxseed oil will also help increase the HDL and decrease Triglycerides.   Recheck labs in 3 months----- inc lipitor to 20 mg daily Lipid, cmp.   Informed patient of the above results & provider's recommendations. She voiced understanding. Scheduled lab appointment for 08/17/16 at 7:00 AM. Future lab orders placed.

## 2016-05-23 ENCOUNTER — Ambulatory Visit (INDEPENDENT_AMBULATORY_CARE_PROVIDER_SITE_OTHER): Payer: Commercial Managed Care - HMO | Admitting: Family

## 2016-05-23 ENCOUNTER — Encounter: Payer: Self-pay | Admitting: Family

## 2016-05-23 VITALS — BP 129/56 | HR 64 | Temp 97.4°F | Resp 16 | Ht 66.0 in | Wt 146.8 lb

## 2016-05-23 DIAGNOSIS — N3000 Acute cystitis without hematuria: Secondary | ICD-10-CM | POA: Diagnosis not present

## 2016-05-23 DIAGNOSIS — R3915 Urgency of urination: Secondary | ICD-10-CM

## 2016-05-23 LAB — POCT URINALYSIS DIPSTICK
BILIRUBIN UA: NEGATIVE
GLUCOSE UA: NEGATIVE
KETONES UA: NEGATIVE
Nitrite, UA: NEGATIVE
Protein, UA: NEGATIVE
UROBILINOGEN UA: NEGATIVE
pH, UA: 6

## 2016-05-23 MED ORDER — CEPHALEXIN 500 MG PO CAPS: 500.0000 mg | ORAL_CAPSULE | Freq: Two times a day (BID) | ORAL | 0 refills | Status: DC

## 2016-05-23 NOTE — Progress Notes (Signed)
Subjective:    Patient ID: Joanna Williams, female    DOB: 02-04-1941, 76 y.o.   MRN: [id number]  HPI  Joanna Williams is a 75 yr old female who presents today with chief complaint of urinary urgency.  Reports bad urinary odor, suprapubic pain and low back pain across her lower back.  Denies associated fever. Energy is poor.  Denies gross hematuria.  Mild urinary urgency.   Review of Systems See HPI  Past Medical History:  Diagnosis Date  . GERD (gastroesophageal reflux disease)   . Hyperlipidemia      Social History   Social History  . Marital status: Married    Spouse name: N/A  . Number of children: N/A  . Years of education: N/A   Occupational History  . retired Retired   Social History Main Topics  . Smoking status: Never Smoker  . Smokeless tobacco: Never Used  . Alcohol use Yes  . Drug use:     Types: Hydrocodone  . Sexual activity: Yes    Partners: Male   Other Topics Concern  . Not on file   Social History Narrative   Walking, yoga    Past Surgical History:  Procedure Laterality Date  . COLONOSCOPY  2 yrs ago  . EYE SURGERY Bilateral 2014   cataract  . TOTAL HIP ARTHROPLASTY  08/10/2011   Procedure: TOTAL HIP ARTHROPLASTY;  Surgeon: Kerin Salen, MD;  Location: [facility];  Service: Orthopedics;  Laterality: Right;  DEPUY/ PENNACLE POLY OR CERAMIC    Family History  Problem Relation Age of Onset  . Heart disease Mother   . Cancer Father 20    brain    Allergies  Allergen Reactions  . Niacin     REACTION: Nausea    Current Outpatient Prescriptions on File Prior to Visit  Medication Sig Dispense Refill  . alendronate (FOSAMAX) 70 MG tablet Take 1 tablet (70 mg total) by mouth every 7 (seven) days. Take with a full glass of water on an empty stomach. 4 tablet 11  . atorvastatin (LIPITOR) 10 MG tablet TAKE 1 TABLET (10 MG TOTAL) BY MOUTH DAILY. (Patient taking differently: Take 20 mg by mouth daily. ) 90 tablet 0  . diclofenac sodium (VOLTAREN) 1  % GEL Apply 4 g topically 4 (four) times daily. 100 g 2  . Fish Oil-Cholecalciferol ([facility] FISH OIL + D3) 1200-1000 MG-UNIT CAPS 2 po qd 90 capsule   . Multiple Vitamins-Minerals (MULTIVITAMIN ADULT PO) Take 1 tablet by mouth daily.    Marland Kitchen nystatin (MYCOSTATIN) powder Apply topically 4 (four) times daily. 30 g 2  . omeprazole (PRILOSEC) 20 MG capsule TAKE ONE CAPSULE BY MOUTH EVERY DAY 90 capsule 3  . zolpidem (AMBIEN) 5 MG tablet Take 1 tablet (5 mg total) by mouth at bedtime as needed. for sleep 90 tablet 0   No current facility-administered medications on file prior to visit.     BP (!) 129/56 (BP Location: Right Arm, Patient Position: Sitting, Cuff Size: Normal)   Pulse 64   Temp 97.4 F (36.3 C) (Oral)   Resp 16   Ht 5\' 6"  (1.676 m)   Wt 146 lb 12.8 oz (66.6 kg)   SpO2 98% Comment: RA  BMI 23.69 kg/m       Objective:   Physical Exam  Constitutional: She appears well-developed and well-nourished.  Cardiovascular: Normal rate, regular rhythm and normal heart sounds.   No murmur heard. Pulmonary/Chest: Effort normal and breath sounds normal. No  respiratory distress. She has no wheezes.  Abdominal: Soft. She exhibits no distension and no mass. There is no tenderness. There is no rebound, no guarding and no CVA tenderness.  Psychiatric: She has a normal mood and affect. Her behavior is normal. Judgment and thought content normal.          Assessment & Plan:  UTI- UA shows trace leukocytes.  Will rx with keflex.  Send urine for culture. Pt is advised as follows   Call if new/worsening symptoms or if you are not improved in 3 days.

## 2016-05-23 NOTE — Progress Notes (Signed)
Pre visit review using our clinic review tool, if applicable. No additional management support is needed unless otherwise documented below in the visit note. 

## 2016-05-23 NOTE — Patient Instructions (Signed)
Please begin keflex for urinary tract infection. Call if new/worsening symptoms or if you are not improved in 3 days.

## 2016-05-25 LAB — URINE CULTURE

## 2016-05-30 NOTE — Telephone Encounter (Signed)
Called insurance to determine if prior authorization needed.  Spoke with Tomeka, who says request form needs to be completed for medication review.  Reference #: [id number]. Awaiting request form.

## 2016-05-30 NOTE — Telephone Encounter (Signed)
Form received, completed and forwarded to PCP for review and signature.

## 2016-05-30 NOTE — Telephone Encounter (Signed)
Form reviewed and signed by provider.  Form faxed.  Fax confirmation received.

## 2016-06-10 ENCOUNTER — Telehealth: Payer: Self-pay | Admitting: Family Medicine

## 2016-06-10 MED ORDER — MIRABEGRON ER 25 MG PO TB24
25.0000 mg | ORAL_TABLET | Freq: Every day | ORAL | 2 refills | Status: DC
Start: 2016-06-10 — End: 2018-05-10

## 2016-06-10 NOTE — Telephone Encounter (Signed)
Recommend trial of myrbetriq for overactive bladder. Follow up with Dr. Etter Sjogren in 1 month.

## 2016-06-10 NOTE — Addendum Note (Signed)
Addended by: Debbrah Alar on: 06/10/2016 03:28 PM   Modules accepted: Orders

## 2016-06-10 NOTE — Telephone Encounter (Signed)
Notified pt. Scheduled 1 month f/u with Dr Carollee Herter for 07/15/16 at 9:15 and medicare wellness exam on 05/04/17 at 8:30am.

## 2016-06-10 NOTE — Telephone Encounter (Signed)
Per mychart lab note:  Written by Debbrah Alar, NP on 05/29/2016 11:27 AM  Your urine did grow a small amount of bacteria. The Keflex should have cleared this up. Let us know if your symptoms are not resolved.   Attempted to notify pt and spouse states that she has gone to the store. Will try back later.

## 2016-06-10 NOTE — Telephone Encounter (Signed)
Notified pt. She states she is no longer having dysuria but continues to have frequency.  Please advise?

## 2016-06-10 NOTE — Telephone Encounter (Signed)
CRelation to [id number] Call back number:[phone]   Reason for call:  Patient inquiring about urine results, please advise

## 2016-06-20 NOTE — Telephone Encounter (Signed)
Prolia benefits verified,  PA required- approved 05/30/16-05/30/18 [id number] 20% Prolia and Admin fee $45 OV   patient will owe Approxmatly $220 out of pocket  Please contact patient to schedule and let Gilmore Laroche know to order Prolia.   It patient would like to wait until next year we will need to verify benefits again.

## 2016-06-20 NOTE — Telephone Encounter (Signed)
Left message on pt's vm to give the office a call back, in regards to prolia injections. LB

## 2016-06-21 ENCOUNTER — Telehealth: Payer: Self-pay | Admitting: Family Medicine

## 2016-06-21 NOTE — Telephone Encounter (Signed)
Caller name: Halana Haeffner Relationship to patient: self Can be reached: [phone] Pharmacy:  Reason for call: Pt states she is returning call to [facility]

## 2016-06-21 NOTE — Telephone Encounter (Signed)
Spoke with pt about her approval for Prolia injections, and the cost out of pocket. Patient state she needs more time to think about paying out of pocket, she states she can not afford it at this time. Pt states she will call back within a week to let the office know if she will receive the injections. Pt had no further questions or concerns. LB

## 2016-06-22 NOTE — Telephone Encounter (Signed)
Called the patient and spoke to her husband informed him to have her call us at her convenience to discuss Prolia.

## 2016-06-30 ENCOUNTER — Other Ambulatory Visit: Payer: Self-pay | Admitting: Family Medicine

## 2016-07-06 NOTE — Telephone Encounter (Signed)
Prolia benfits re-verified for  2018  PA on file [id number] valid thru 05/30/18 Prolia and Admin fee are subject to 20% co-insurance  $45 copay if OV billed

## 2016-07-08 ENCOUNTER — Other Ambulatory Visit: Payer: Self-pay | Admitting: Family Medicine

## 2016-07-08 MED ORDER — ATORVASTATIN CALCIUM 20 MG PO TABS: 20.0000 mg | ORAL_TABLET | Freq: Every day | ORAL | 0 refills | Status: DC

## 2016-07-08 NOTE — Telephone Encounter (Signed)
Patient called stating she was returning your call. Please advise  Phone: [phone]

## 2016-07-08 NOTE — Telephone Encounter (Signed)
Caller name: Ellyette  Relation to pt: self  Call back number: [phone] Pharmacy: [id number] [place], [place]  Reason for call: Pt states needs refill on her prescription atorvastatin (LIPITOR) 10 MG tablet, pt states provider had given her a 90 day supply but with 10 mg, pt mentioned provider changed the dose to 20 mg and that she is needing her refill now with the new dosage on Atorvastatin with 20 mg.  Pt states has no meds left since the 10 mg was taken as 20 mg. Pt is needing med ASAP. Please advise.

## 2016-07-08 NOTE — Telephone Encounter (Signed)
paitent returned call and updated medication list per PCP instructions

## 2016-07-08 NOTE — Telephone Encounter (Signed)
Reviewed last lab results on 05/11/2016 and PCP did increase her to atorvastatin 20 mg Updated medication and called the patient to discuss/clarify all left message to call back.

## 2016-07-08 NOTE — Telephone Encounter (Signed)
Patient informed of cost to her and she is having an appt. With PCP on the 12th and will discuss with Dr. Etter Sjogren her options at that time.

## 2016-07-15 ENCOUNTER — Other Ambulatory Visit (HOSPITAL_COMMUNITY)
Admission: RE | Admit: 2016-07-15 | Discharge: 2016-07-15 | Disposition: A | Discharge status: Home / Self Care | Payer: Medicare HMO | Source: Ambulatory Visit | Attending: Family Medicine | Admitting: Family Medicine

## 2016-07-15 ENCOUNTER — Encounter: Payer: Self-pay | Admitting: Family Medicine

## 2016-07-15 ENCOUNTER — Ambulatory Visit (INDEPENDENT_AMBULATORY_CARE_PROVIDER_SITE_OTHER): Payer: Medicare HMO | Admitting: Family Medicine

## 2016-07-15 VITALS — BP 130/78 | HR 72 | Temp 97.9°F | Resp 16 | Ht 64.0 in | Wt 144.0 lb

## 2016-07-15 DIAGNOSIS — K149 Disease of tongue, unspecified: Secondary | ICD-10-CM | POA: Diagnosis not present

## 2016-07-15 DIAGNOSIS — R319 Hematuria, unspecified: Secondary | ICD-10-CM | POA: Diagnosis not present

## 2016-07-15 DIAGNOSIS — N3000 Acute cystitis without hematuria: Secondary | ICD-10-CM | POA: Diagnosis not present

## 2016-07-15 DIAGNOSIS — M810 Age-related osteoporosis without current pathological fracture: Secondary | ICD-10-CM

## 2016-07-15 DIAGNOSIS — Z79899 Other long term (current) drug therapy: Secondary | ICD-10-CM | POA: Diagnosis not present

## 2016-07-15 DIAGNOSIS — K219 Gastro-esophageal reflux disease without esophagitis: Secondary | ICD-10-CM | POA: Insufficient documentation

## 2016-07-15 DIAGNOSIS — E785 Hyperlipidemia, unspecified: Secondary | ICD-10-CM | POA: Diagnosis not present

## 2016-07-15 DIAGNOSIS — Z8249 Family history of ischemic heart disease and other diseases of the circulatory system: Secondary | ICD-10-CM | POA: Diagnosis not present

## 2016-07-15 DIAGNOSIS — N390 Urinary tract infection, site not specified: Secondary | ICD-10-CM

## 2016-07-15 DIAGNOSIS — R220 Localized swelling, mass and lump, head: Secondary | ICD-10-CM | POA: Diagnosis not present

## 2016-07-15 DIAGNOSIS — K148 Other diseases of tongue: Secondary | ICD-10-CM

## 2016-07-15 DIAGNOSIS — Z7983 Long term (current) use of bisphosphonates: Secondary | ICD-10-CM | POA: Insufficient documentation

## 2016-07-15 LAB — COMPREHENSIVE METABOLIC PANEL
ALT: 14 U/L (ref 0–35)
AST: 17 U/L (ref 0–37)
Albumin: 4.1 g/dL (ref 3.5–5.2)
Alkaline Phosphatase: 78 U/L (ref 39–117)
BILIRUBIN TOTAL: 0.5 mg/dL (ref 0.2–1.2)
BUN: 17 mg/dL (ref 6–23)
CO2: 29 meq/L (ref 19–32)
CREATININE: 0.66 mg/dL (ref 0.40–1.20)
Calcium: 9 mg/dL (ref 8.4–10.5)
Chloride: 107 mEq/L (ref 96–112)
GFR: 92.41 mL/min (ref >60.00)
GLUCOSE: 86 mg/dL (ref 70–99)
Potassium: 4.2 mEq/L (ref 3.5–5.1)
Sodium: 142 mEq/L (ref 135–145)
Total Protein: 6.7 g/dL (ref 6.0–8.3)

## 2016-07-15 LAB — POC URINALSYSI DIPSTICK (AUTOMATED)
BILIRUBIN UA: NEGATIVE
Glucose, UA: NEGATIVE
Ketones, UA: NEGATIVE
NITRITE UA: NEGATIVE
PROTEIN UA: NEGATIVE
Spec Grav, UA: 1.025
Urobilinogen, UA: NEGATIVE
pH, UA: 6

## 2016-07-15 NOTE — Progress Notes (Signed)
Patient ID: Joanna Williams, female    DOB: 1941/02/03  Age: 76 y.o. MRN: [id number]    Subjective:  Subjective  HPI Joanna Williams presents for f/u uti  No more symptoms but she c/o odor in urine. Pt also c/o mass on L side of tongue that has grown in size.  Non tender.    Review of Systems  Constitutional: Negative for appetite change, diaphoresis, fatigue and unexpected weight change.  HENT: Positive for mouth sores. Negative for congestion, sinus pain, sinus pressure, sneezing and sore throat.   Eyes: Negative for pain, redness and visual disturbance.  Respiratory: Negative for cough, chest tightness, shortness of breath and wheezing.   Cardiovascular: Negative for chest pain, palpitations and leg swelling.  Endocrine: Negative for cold intolerance, heat intolerance, polydipsia, polyphagia and polyuria.  Genitourinary: Negative for difficulty urinating, dysuria and frequency.  Neurological: Negative for dizziness, light-headedness, numbness and headaches.    History Past Medical History:  Diagnosis Date  . GERD (gastroesophageal reflux disease)   . Hyperlipidemia     She has a past surgical history that includes Colonoscopy (2 yrs ago); Total hip arthroplasty (08/10/2011); and Eye surgery (Bilateral, 2014).   Her family history includes Cancer (age of onset: 44) in her father; Heart disease in her mother.She reports that she has never smoked. She has never used smokeless tobacco. She reports that she drinks alcohol. She reports that she uses drugs, including Hydrocodone.  Current Outpatient Prescriptions on File Prior to Visit  Medication Sig Dispense Refill  . alendronate (FOSAMAX) 70 MG tablet Take 1 tablet (70 mg total) by mouth every 7 (seven) days. Take with a full glass of water on an empty stomach. 4 tablet 11  . atorvastatin (LIPITOR) 20 MG tablet Take 1 tablet (20 mg total) by mouth daily. 90 tablet 0  . diclofenac sodium (VOLTAREN) 1 % GEL Apply 4 g topically 4  (four) times daily. 100 g 2  . Fish Oil-Cholecalciferol ([facility] FISH OIL + D3) 1200-1000 MG-UNIT CAPS 2 po qd 90 capsule   . mirabegron ER (MYRBETRIQ) 25 MG TB24 tablet Take 1 tablet (25 mg total) by mouth daily. 30 tablet 2  . Multiple Vitamins-Minerals (MULTIVITAMIN ADULT PO) Take 1 tablet by mouth daily.    Marland Kitchen nystatin (MYCOSTATIN) powder Apply topically 4 (four) times daily. 30 g 2  . omeprazole (PRILOSEC) 20 MG capsule TAKE ONE CAPSULE BY MOUTH EVERY DAY 90 capsule 3  . zolpidem (AMBIEN) 5 MG tablet Take 1 tablet (5 mg total) by mouth at bedtime as needed. for sleep 90 tablet 0   No current facility-administered medications on file prior to visit.      Objective:  Objective  Physical Exam  Constitutional: She is oriented to person, place, and time. She appears well-developed and well-nourished.  HENT:  Head: Normocephalic and atraumatic.  Mouth/Throat:    Eyes: Conjunctivae and EOM are normal.  Neck: Normal range of motion. Neck supple. No JVD present. Carotid bruit is not present. No thyromegaly present.  Cardiovascular: Normal rate, regular rhythm and normal heart sounds.   No murmur heard. Pulmonary/Chest: Effort normal and breath sounds normal. No respiratory distress. She has no wheezes. She has no rales. She exhibits no tenderness.  Musculoskeletal: She exhibits no edema.  Neurological: She is alert and oriented to person, place, and time.  Psychiatric: She has a normal mood and affect.  Nursing note and vitals reviewed.  BP 130/78 (BP Location: Left Arm, Cuff Size: Normal)   Pulse 72  Temp 97.9 F (36.6 C) (Oral)   Resp 16   Ht 5\' 4"  (1.626 m)   Wt 144 lb (65.3 kg)   SpO2 95%   BMI 24.72 kg/m  Wt Readings from Last 3 Encounters:  07/15/16 144 lb (65.3 kg)  05/23/16 146 lb 12.8 oz (66.6 kg)  05/03/16 146 lb 6.4 oz (66.4 kg)     Lab Results  Component Value Date   WBC 6.8 05/11/2016   HGB 13.9 05/11/2016   HCT 41.0 05/11/2016   PLT 271.0 05/11/2016    GLUCOSE 86 07/15/2016   CHOL 213 (H) 05/11/2016   TRIG 128.0 05/11/2016   HDL 57.70 05/11/2016   LDLDIRECT 208.0 09/17/2015   LDLCALC 129 (H) 05/11/2016   ALT 14 07/15/2016   AST 17 07/15/2016   NA 142 07/15/2016   K 4.2 07/15/2016   CL 107 07/15/2016   CREATININE 0.66 07/15/2016   BUN 17 07/15/2016   CO2 29 07/15/2016   TSH 2.34 06/15/2010   INR 1.50 (H) 08/13/2011   HGBA1C 5.8 08/13/2012   MICROALBUR <0.7 03/19/2015    Mm Screening Breast Tomo Bilateral  Result Date: 04/05/2016 CLINICAL DATA:  Screening. EXAM: 2D DIGITAL SCREENING BILATERAL MAMMOGRAM WITH CAD AND ADJUNCT TOMO COMPARISON:  Previous exam(s). ACR Breast Density Category b: There are scattered areas of fibroglandular density. FINDINGS: There are no findings suspicious for malignancy. Images were processed with CAD. IMPRESSION: No mammographic evidence of malignancy. A result letter of this screening mammogram will be mailed directly to the patient. RECOMMENDATION: Screening mammogram in one year. (Code:SM-B-01Y) BI-RADS CATEGORY  1: Negative. Electronically Signed   By: Ammie Ferrier M.D.   On: 04/05/2016 16:57     Assessment & Plan:  Plan  I have discontinued [name] cephALEXin. I am also having her maintain her Multiple Vitamins-Minerals (MULTIVITAMIN ADULT PO), [facility]S FISH OIL + D3, nystatin, diclofenac sodium, omeprazole, alendronate, zolpidem, mirabegron ER, and atorvastatin.  No orders of the defined types were placed in this encounter.   Problem List Items Addressed This Visit      Unprioritized   UTI (urinary tract infection) - Primary   Relevant Orders   POCT Urinalysis Dipstick (Automated) (Completed)   Urine cytology ancillary only   Urine culture (Completed)    Other Visit Diagnoses    Tongue mass       Relevant Orders   Ambulatory referral to ENT   Osteoporosis, unspecified osteoporosis type, unspecified pathological fracture presence       Relevant Orders   Comprehensive metabolic  panel (Completed)      Follow-up: No Follow-up on file.  Ann Held, DO

## 2016-07-15 NOTE — Patient Instructions (Signed)
Urinary Tract Infection, Adult Introduction A urinary tract infection (UTI) is an infection of any part of the urinary tract. The urinary tract includes the:  Kidneys.  Ureters.  Bladder.  Urethra. These organs make, store, and get rid of pee (urine) in the body. Follow these instructions at home:  Take over-the-counter and prescription medicines only as told by your doctor.  If you were prescribed an antibiotic medicine, take it as told by your doctor. Do not stop taking the antibiotic even if you start to feel better.  Avoid the following drinks:  Alcohol.  Caffeine.  Tea.  Carbonated drinks.  Drink enough fluid to keep your pee clear or pale yellow.  Keep all follow-up visits as told by your doctor. This is important.  Make sure to:  Empty your bladder often and completely. Do not to hold pee for long periods of time.  Empty your bladder before and after sex.  Wipe from front to back after a bowel movement if you are female. Use each tissue one time when you wipe. Contact a doctor if:  You have back pain.  You have a fever.  You feel sick to your stomach (nauseous).  You throw up (vomit).  Your symptoms do not get better after 3 days.  Your symptoms go away and then come back. Get help right away if:  You have very bad back pain.  You have very bad lower belly (abdominal) pain.  You are throwing up and cannot keep down any medicines or water. This information is not intended to replace advice given to you by your health care provider. Make sure you discuss any questions you have with your health care provider. Document Released: 12/07/2007 Document Revised: 11/26/2015 Document Reviewed: 05/11/2015  2017 Elsevier  

## 2016-07-15 NOTE — Progress Notes (Signed)
Pre visit review using our clinic review tool, if applicable. No additional management support is needed unless otherwise documented below in the visit note. 

## 2016-07-17 LAB — URINE CULTURE

## 2016-07-18 LAB — URINE CYTOLOGY ANCILLARY ONLY: TRICH (WINDOWPATH): NEGATIVE

## 2016-07-21 LAB — URINE CYTOLOGY ANCILLARY ONLY
Bacterial vaginitis: NEGATIVE
Candida vaginitis: NEGATIVE

## 2016-07-26 NOTE — Telephone Encounter (Signed)
This patient had her prolia approved in November, but not ordered yet.  She did state she saw PCP on 07/15/2016 and discussed everything about the shot and thinks she is good to go to schedule nurse visit/order prolia Etc.  Let me know so I can help her get going on this. Thanks,

## 2016-07-26 NOTE — Telephone Encounter (Signed)
Labs are done if Joanna Williams has had it approved we are good to go

## 2016-07-26 NOTE — Telephone Encounter (Signed)
Checked with [place] and new PA completed and on file until 05/2017.  So good to good/no changes since 05/2016. PA done. Scheduled appointment for injection on 08/02/2016 at 3:30 Let Gilmore Laroche know to order her a Prolia. By staff message.

## 2016-08-02 ENCOUNTER — Ambulatory Visit (INDEPENDENT_AMBULATORY_CARE_PROVIDER_SITE_OTHER): Payer: Medicare HMO

## 2016-08-02 DIAGNOSIS — M810 Age-related osteoporosis without current pathological fracture: Secondary | ICD-10-CM

## 2016-08-02 MED ORDER — DENOSUMAB 60 MG/ML ~~LOC~~ SOLN
60.0000 mg | Freq: Once | SUBCUTANEOUS | Status: AC
  Administered 2016-08-02: 60 mg via SUBCUTANEOUS

## 2016-08-02 NOTE — Progress Notes (Signed)
Pre visit review using our clinic tool,if applicable. No additional management support is needed unless otherwise documented below in the visit note.   Patient in for Prolia injection. Given Sq 60mg  left arm. Per order from Dr. Garnet Koyanagi for Osteoperosis. Patient tolerated well. Will call patient for appointment when next injection due.

## 2016-08-15 DIAGNOSIS — K148 Other diseases of tongue: Secondary | ICD-10-CM | POA: Diagnosis not present

## 2016-08-17 ENCOUNTER — Other Ambulatory Visit: Payer: Commercial Managed Care - HMO

## 2016-10-04 ENCOUNTER — Other Ambulatory Visit: Payer: Self-pay | Admitting: Family Medicine

## 2016-10-07 ENCOUNTER — Ambulatory Visit: Payer: Medicare HMO | Admitting: Family Medicine

## 2016-10-17 ENCOUNTER — Other Ambulatory Visit: Payer: Self-pay | Admitting: Family Medicine

## 2016-10-17 DIAGNOSIS — G4700 Insomnia, unspecified: Secondary | ICD-10-CM

## 2016-10-17 NOTE — Telephone Encounter (Signed)
Requesting:  zolpidem Contract   none UDS   none Last OV   07/15/2016----future appt is on 05/04/2017 Last Refill   #90 with 0 refills on 05/03/2016  Please Advise

## 2016-11-08 ENCOUNTER — Telehealth: Payer: Self-pay | Admitting: *Deleted

## 2016-11-08 NOTE — Telephone Encounter (Signed)
LM for patient to return call to schedule AWV.   

## 2016-11-13 ENCOUNTER — Other Ambulatory Visit: Payer: Self-pay | Admitting: Family Medicine

## 2016-12-19 ENCOUNTER — Telehealth: Payer: Self-pay | Admitting: *Deleted

## 2016-12-19 NOTE — Telephone Encounter (Signed)
Received PA request from [place]; for approval on Prolia; forwarded to [place] for completion/SLS 06/18

## 2016-12-26 ENCOUNTER — Ambulatory Visit: Payer: Medicare HMO | Admitting: *Deleted

## 2017-01-13 ENCOUNTER — Telehealth: Payer: Self-pay | Admitting: Family Medicine

## 2017-01-13 NOTE — Telephone Encounter (Signed)
Last CMP 07/25/2016 Last Bone Density 03/28/2016 Patient notified of results and ok with cost. Scheduled her a nurse visit appointment on 01/31/2017 at 3 pm. Joanna Williams tricia a staff message to order her a prolia.

## 2017-01-13 NOTE — Telephone Encounter (Signed)
Verified Prolia benefits PA is required and is on file AUTH # [id number] valid 05/30/16-05/30/18 Admin and Prolia subject to 20% co-insurance  Patient may owe approximately $220 OOP  Due after 01/29/17

## 2017-01-27 ENCOUNTER — Telehealth: Payer: Self-pay | Admitting: *Deleted

## 2017-01-27 NOTE — Telephone Encounter (Signed)
LM for patient to return call to schedule AWV.   

## 2017-01-31 ENCOUNTER — Ambulatory Visit (INDEPENDENT_AMBULATORY_CARE_PROVIDER_SITE_OTHER): Payer: Medicare HMO | Admitting: Behavioral Health

## 2017-01-31 DIAGNOSIS — M810 Age-related osteoporosis without current pathological fracture: Secondary | ICD-10-CM | POA: Diagnosis not present

## 2017-01-31 MED ORDER — DENOSUMAB 60 MG/ML ~~LOC~~ SOLN
60.0000 mg | Freq: Once | SUBCUTANEOUS | Status: AC
  Administered 2017-01-31: 60 mg via SUBCUTANEOUS

## 2017-01-31 NOTE — Progress Notes (Signed)
Pre visit review using our clinic review tool, if applicable. No additional management support is needed unless otherwise documented below in the visit note.  Patient came in office for Prolia injection. SQ injection was given in the left arm. Patient tolerated it well. No signs or symptoms of a reaction before leaving the nurse visit.

## 2017-02-03 DIAGNOSIS — H52229 Regular astigmatism, unspecified eye: Secondary | ICD-10-CM | POA: Diagnosis not present

## 2017-02-24 ENCOUNTER — Other Ambulatory Visit: Payer: Self-pay | Admitting: Family Medicine

## 2017-02-24 DIAGNOSIS — G4700 Insomnia, unspecified: Secondary | ICD-10-CM

## 2017-02-24 MED ORDER — ZOLPIDEM TARTRATE 5 MG PO TABS: 5.0000 mg | ORAL_TABLET | Freq: Every evening | ORAL | 0 refills | Status: DC | PRN

## 2017-02-24 NOTE — Telephone Encounter (Signed)
Pt is requesting refill on Ambien. Please advise.

## 2017-02-24 NOTE — Telephone Encounter (Signed)
Noted. Rx printed and forwarded to PCP for review and signature.  

## 2017-02-24 NOTE — Telephone Encounter (Signed)
Carollee Herter, Alferd Apa, DO  Rudene Anda, RN        Refill Lorrin Mais if not done

## 2017-04-05 ENCOUNTER — Other Ambulatory Visit: Payer: Self-pay | Admitting: Family Medicine

## 2017-04-05 DIAGNOSIS — Z1231 Encounter for screening mammogram for malignant neoplasm of breast: Secondary | ICD-10-CM

## 2017-04-06 ENCOUNTER — Other Ambulatory Visit: Payer: Self-pay | Admitting: Family Medicine

## 2017-04-10 ENCOUNTER — Ambulatory Visit (HOSPITAL_BASED_OUTPATIENT_CLINIC_OR_DEPARTMENT_OTHER)
Admission: RE | Admit: 2017-04-10 | Discharge: 2017-04-10 | Disposition: A | Discharge status: Home / Self Care | Payer: Medicare HMO | Source: Ambulatory Visit | Attending: Family Medicine | Admitting: Family Medicine

## 2017-04-10 ENCOUNTER — Encounter (HOSPITAL_BASED_OUTPATIENT_CLINIC_OR_DEPARTMENT_OTHER): Payer: Self-pay

## 2017-04-10 DIAGNOSIS — Z1231 Encounter for screening mammogram for malignant neoplasm of breast: Secondary | ICD-10-CM

## 2017-05-04 ENCOUNTER — Ambulatory Visit (INDEPENDENT_AMBULATORY_CARE_PROVIDER_SITE_OTHER): Payer: Medicare HMO | Admitting: Family Medicine

## 2017-05-04 ENCOUNTER — Encounter: Payer: Self-pay | Admitting: Family Medicine

## 2017-05-04 VITALS — BP 138/80 | Temp 97.9°F | Ht 64.0 in | Wt 143.0 lb

## 2017-05-04 DIAGNOSIS — D229 Melanocytic nevi, unspecified: Secondary | ICD-10-CM | POA: Diagnosis not present

## 2017-05-04 DIAGNOSIS — Z0001 Encounter for general adult medical examination with abnormal findings: Secondary | ICD-10-CM

## 2017-05-04 DIAGNOSIS — G4700 Insomnia, unspecified: Secondary | ICD-10-CM | POA: Diagnosis not present

## 2017-05-04 DIAGNOSIS — E785 Hyperlipidemia, unspecified: Secondary | ICD-10-CM

## 2017-05-04 DIAGNOSIS — H3530 Unspecified macular degeneration: Secondary | ICD-10-CM

## 2017-05-04 DIAGNOSIS — K219 Gastro-esophageal reflux disease without esophagitis: Secondary | ICD-10-CM

## 2017-05-04 DIAGNOSIS — Z0000 Encounter for general adult medical examination without abnormal findings: Secondary | ICD-10-CM

## 2017-05-04 HISTORY — DX: Unspecified macular degeneration: H35.30

## 2017-05-04 LAB — COMPREHENSIVE METABOLIC PANEL
ALBUMIN: 4.2 g/dL (ref 3.5–5.2)
ALT: 12 U/L (ref 0–35)
AST: 18 U/L (ref 0–37)
Alkaline Phosphatase: 61 U/L (ref 39–117)
BUN: 15 mg/dL (ref 6–23)
CALCIUM: 8.9 mg/dL (ref 8.4–10.5)
CHLORIDE: 104 meq/L (ref 96–112)
CO2: 31 mEq/L (ref 19–32)
Creatinine, Ser: 0.59 mg/dL (ref 0.40–1.20)
GFR: 104.95 mL/min (ref >60.00)
Glucose, Bld: 90 mg/dL (ref 70–99)
POTASSIUM: 4.2 meq/L (ref 3.5–5.1)
Sodium: 140 mEq/L (ref 135–145)
Total Bilirubin: 0.5 mg/dL (ref 0.2–1.2)
Total Protein: 6.8 g/dL (ref 6.0–8.3)

## 2017-05-04 LAB — LIPID PANEL
CHOLESTEROL: 179 mg/dL (ref 0–200)
HDL: 48 mg/dL (ref >39.00)
LDL Cholesterol: 101 mg/dL — ABNORMAL HIGH (ref 0–99)
NonHDL: 130.91
TRIGLYCERIDES: 148 mg/dL (ref 0.0–149.0)
Total CHOL/HDL Ratio: 4
VLDL: 29.6 mg/dL (ref 0.0–40.0)

## 2017-05-04 LAB — CBC WITH DIFFERENTIAL/PLATELET
BASOS PCT: 0.9 % (ref 0.0–3.0)
Basophils Absolute: 0.1 10*3/uL (ref 0.0–0.1)
EOS PCT: 2.9 % (ref 0.0–5.0)
Eosinophils Absolute: 0.2 10*3/uL (ref 0.0–0.7)
HEMATOCRIT: 42.6 % (ref 36.0–46.0)
HEMOGLOBIN: 14.2 g/dL (ref 12.0–15.0)
LYMPHS PCT: 31.7 % (ref 12.0–46.0)
Lymphs Abs: 2 10*3/uL (ref 0.7–4.0)
MCHC: 33.4 g/dL (ref 30.0–36.0)
MCV: 95.1 fl (ref 78.0–100.0)
MONO ABS: 0.7 10*3/uL (ref 0.1–1.0)
Monocytes Relative: 10.8 % (ref 3.0–12.0)
NEUTROS ABS: 3.4 10*3/uL (ref 1.4–7.7)
Neutrophils Relative %: 53.7 % (ref 43.0–77.0)
Platelets: 241 10*3/uL (ref 150.0–400.0)
RBC: 4.48 Mil/uL (ref 3.87–5.11)
RDW: 12.8 % (ref 11.5–15.5)
WBC: 6.3 10*3/uL (ref 4.0–10.5)

## 2017-05-04 MED ORDER — ZOLPIDEM TARTRATE 5 MG PO TABS: 5.0000 mg | ORAL_TABLET | Freq: Every evening | ORAL | 0 refills | Status: DC | PRN

## 2017-05-04 MED ORDER — OMEPRAZOLE 20 MG PO CPDR: 20.0000 mg | DELAYED_RELEASE_CAPSULE | Freq: Every day | ORAL | 3 refills | Status: DC

## 2017-05-04 NOTE — Patient Instructions (Signed)
Preventive Care 77 Years and Older, Female Preventive care refers to lifestyle choices and visits with your health care provider that can promote health and wellness. What does preventive care include?  A yearly physical exam. This is also called an annual well check.  Dental exams once or twice a year.  Routine eye exams. Ask your health care provider how often you should have your eyes checked.  Personal lifestyle choices, including: ? Daily care of your teeth and gums. ? Regular physical activity. ? Eating a healthy diet. ? Avoiding tobacco and drug use. ? Limiting alcohol use. ? Practicing safe sex. ? Taking low-dose aspirin every day. ? Taking vitamin and mineral supplements as recommended by your health care provider. What happens during an annual well check? The services and screenings done by your health care provider during your annual well check will depend on your age, overall health, lifestyle risk factors, and family history of disease. Counseling Your health care provider may ask you questions about your:  Alcohol use.  Tobacco use.  Drug use.  Emotional well-being.  Home and relationship well-being.  Sexual activity.  Eating habits.  History of falls.  Memory and ability to understand (cognition).  Work and work environment.  Reproductive health.  Screening You may have the following tests or measurements:  Height, weight, and BMI.  Blood pressure.  Lipid and cholesterol levels. These may be checked every 5 years, or more frequently if you are over 50 years old.  Skin check.  Lung cancer screening. You may have this screening every year starting at age 55 if you have a 30-pack-year history of smoking and currently smoke or have quit within the past 15 years.  Fecal occult blood test (FOBT) of the stool. You may have this test every year starting at age 50.  Flexible sigmoidoscopy or colonoscopy. You may have a sigmoidoscopy every 5 years or  a colonoscopy every 10 years starting at age 50.  Hepatitis C blood test.  Hepatitis B blood test.  Sexually transmitted disease (STD) testing.  Diabetes screening. This is done by checking your blood sugar (glucose) after you have not eaten for a while (fasting). You may have this done every 1-3 years.  Bone density scan. This is done to screen for osteoporosis. You may have this done starting at age 65.  Mammogram. This may be done every 1-2 years. Talk to your health care provider about how often you should have regular mammograms.  Talk with your health care provider about your test results, treatment options, and if necessary, the need for more tests. Vaccines Your health care provider may recommend certain vaccines, such as:  Influenza vaccine. This is recommended every year.  Tetanus, diphtheria, and acellular pertussis (Tdap, Td) vaccine. You may need a Td booster every 10 years.  Varicella vaccine. You may need this if you have not been vaccinated.  Zoster vaccine. You may need this after age 60.  Measles, mumps, and rubella (MMR) vaccine. You may need at least one dose of MMR if you were born in 1957 or later. You may also need a second dose.  Pneumococcal 13-valent conjugate (PCV13) vaccine. One dose is recommended after age 65.  Pneumococcal polysaccharide (PPSV23) vaccine. One dose is recommended after age 65.  Meningococcal vaccine. You may need this if you have certain conditions.  Hepatitis A vaccine. You may need this if you have certain conditions or if you travel or work in places where you may be exposed to hepatitis   A.  Hepatitis B vaccine. You may need this if you have certain conditions or if you travel or work in places where you may be exposed to hepatitis B.  Haemophilus influenzae type b (Hib) vaccine. You may need this if you have certain conditions.  Talk to your health care provider about which screenings and vaccines you need and how often you  need them. This information is not intended to replace advice given to you by your health care provider. Make sure you discuss any questions you have with your health care provider. Document Released: 07/17/2015 Document Revised: 03/09/2016 Document Reviewed: 04/21/2015 Elsevier Interactive Patient Education  2017 [employer].

## 2017-05-04 NOTE — Progress Notes (Addendum)
Subjective:     Joanna Williams is a 76 y.o. female and is here for a comprehensive physical exam. The patient reports problems - back pain---ibuprofen helps -- she takes 2 a day only. Pt c/o mole on forehead that has gotten bigger.    Social History   Socioeconomic History  . Marital status: Married    Spouse name: Not on file  . Number of children: Not on file  . Years of education: Not on file  . Highest education level: Not on file  Social Needs  . Financial resource strain: Not on file  . Food insecurity - worry: Not on file  . Food insecurity - inability: Not on file  . Transportation needs - medical: Not on file  . Transportation needs - non-medical: Not on file  Occupational History  . Occupation: retired    Fish farm manager: RETIRED  Tobacco Use  . Smoking status: Never Smoker  . Smokeless tobacco: Never Used  Substance and Sexual Activity  . Alcohol use: Yes  . Drug use: Yes    Types: Hydrocodone  . Sexual activity: Yes    Partners: Male  Other Topics Concern  . Not on file  Social History Narrative   Walking, yoga   Health Maintenance  Topic Date Due  . Samul Dada  12/24/1959  . MAMMOGRAM  04/10/2018  . INFLUENZA VACCINE  Completed  . DEXA SCAN  Completed  . PNA vac Low Risk Adult  Completed    The following portions of the patient's history were reviewed and updated as appropriate:  She  has a past medical history of GERD (gastroesophageal reflux disease) and Hyperlipidemia. She does not have any pertinent problems on file. She  has a past surgical history that includes Colonoscopy (2 yrs ago); Eye surgery (Bilateral, 2014); and TOTAL HIP ARTHROPLASTY (Right, 08/10/2011). Her family history includes Cancer (age of onset: 56) in her father; Heart disease in her mother. She  reports that  has never smoked. she has never used smokeless tobacco. She reports that she drinks alcohol. She reports that she uses drugs. Drug: Hydrocodone. She has a current medication  list which includes the following prescription(s): atorvastatin, denosumab, diclofenac sodium, [facility]vs fish oil + d3, mirabegron er, multiple vitamins-minerals, nystatin, omeprazole, and zolpidem. Current Outpatient Medications on File Prior to Visit  Medication Sig Dispense Refill  . atorvastatin (LIPITOR) 20 MG tablet TAKE 1 TABLET BY MOUTH EVERY DAY 90 tablet 0  . denosumab (PROLIA) 60 MG/ML SOLN injection Inject 60 mg into the skin every 6 (six) months. Administer in upper arm, thigh, or abdomen    . diclofenac sodium (VOLTAREN) 1 % GEL Apply 4 g topically 4 (four) times daily. 100 g 2  . Fish Oil-Cholecalciferol ([facility]S FISH OIL + D3) 1200-1000 MG-UNIT CAPS 2 po qd 90 capsule   . mirabegron ER (MYRBETRIQ) 25 MG TB24 tablet Take 1 tablet (25 mg total) by mouth daily. 30 tablet 2  . Multiple Vitamins-Minerals (MULTIVITAMIN ADULT PO) Take 1 tablet by mouth daily.    Marland Kitchen nystatin (MYCOSTATIN) powder Apply topically 4 (four) times daily. 30 g 2   No current facility-administered medications on file prior to visit.    She is allergic to niacin..  Review of Systems Review of Systems  Constitutional: Negative for activity change, appetite change and fatigue.  HENT: Negative for hearing loss, congestion, tinnitus and ear discharge.  dentist q66m Eyes: Negative for visual disturbance (see optho q1y -- vision corrected to 20/20 with glasses).  Respiratory: Negative  for cough, chest tightness and shortness of breath.   Cardiovascular: Negative for chest pain, palpitations and leg swelling.  Gastrointestinal: Negative for abdominal pain, diarrhea, constipation and abdominal distention.  Genitourinary: Negative for urgency, frequency, decreased urine volume and difficulty urinating.  Musculoskeletal: Negative for back pain, arthralgias and gait problem.  Skin: Negative for color change, pallor and rash.  Neurological: Negative for dizziness, light-headedness, numbness and headaches.  Hematological: Negative  for adenopathy. Does not bruise/bleed easily.  Psychiatric/Behavioral: Negative for suicidal ideas, confusion, sleep disturbance, self-injury, dysphoric mood, decreased concentration and agitation.       Objective:    BP 138/80   Temp 97.9 F (36.6 C) (Oral)   Ht 5\' 4"  (1.626 m)   Wt 143 lb (64.9 kg)   BMI 24.55 kg/m  General appearance: alert, cooperative, appears stated age and no distress Head: Normocephalic, without obvious abnormality, atraumatic Eyes: conjunctivae/corneas clear. PERRL, EOM's intact. Fundi benign. Ears: normal TM's and external ear canals both ears Nose: Nares normal. Septum midline. Mucosa normal. No drainage or sinus tenderness. Throat: lips, mucosa, and tongue normal; teeth and gums normal Neck: no adenopathy, no carotid bruit, no JVD, supple, symmetrical, trachea midline and thyroid not enlarged, symmetric, no tenderness/mass/nodules Back: symmetric, no curvature. ROM normal. No CVA tenderness. Lungs: clear to auscultation bilaterally Breasts: normal appearance, no masses or tenderness Heart: regular rate and rhythm, S1, S2 normal, no murmur, click, rub or gallop Abdomen: soft, non-tender; bowel sounds normal; no masses,  no organomegaly Pelvic: not indicated; post-menopausal, no abnormal Pap smears in past Extremities: extremities normal, atraumatic, no cyanosis or edema Pulses: 2+ and symmetric Skin: fleshy nevus on forehead-- about 1 cm diam, rasied Lymph nodes: Cervical, supraclavicular, and axillary nodes normal. Neurologic: Alert and oriented X 3, normal strength and tone. Normal symmetric reflexes. Normal coordination and gait    Assessment:    Healthy female exam.      Plan:    ghm utd Check labs See After Visit Summary for Counseling Recommendations    1. Gastroesophageal reflux disease, esophagitis presence not specified Stable  - omeprazole (PRILOSEC) 20 MG capsule; Take 1 capsule (20 mg total) by mouth daily.  Dispense: 90 capsule;  Refill: 3  2. Insomnia, unspecified type Stable refill meds  - zolpidem (AMBIEN) 5 MG tablet; Take 1 tablet (5 mg total) by mouth at bedtime as needed for sleep.  Dispense: 90 tablet; Refill: 0  3. Preventative health care See above   4. Suspicious nevus On forehead  - Ambulatory referral to Dermatology  5. Hyperlipidemia LDL goal <100 Tolerating statin, encouraged heart healthy diet, avoid trans fats, minimize simple carbs and saturated fats. Increase exercise as tolerated - Lipid panel - CBC with Differential/Platelet - Comprehensive metabolic panel

## 2017-05-09 DIAGNOSIS — H43813 Vitreous degeneration, bilateral: Secondary | ICD-10-CM | POA: Diagnosis not present

## 2017-05-09 DIAGNOSIS — H353112 Nonexudative age-related macular degeneration, right eye, intermediate dry stage: Secondary | ICD-10-CM | POA: Diagnosis not present

## 2017-05-09 DIAGNOSIS — H353221 Exudative age-related macular degeneration, left eye, with active choroidal neovascularization: Secondary | ICD-10-CM | POA: Diagnosis not present

## 2017-05-16 DIAGNOSIS — H353221 Exudative age-related macular degeneration, left eye, with active choroidal neovascularization: Secondary | ICD-10-CM | POA: Diagnosis not present

## 2017-05-24 ENCOUNTER — Telehealth: Payer: Self-pay | Admitting: *Deleted

## 2017-05-24 NOTE — Telephone Encounter (Signed)
Notified pt that referral was to dermatology. Pt voices understanding and will call them back to schedule. Pt also requests copy of most recent labs be mailed to her as she was having trouble with mychart. Results mailed.  Copied from [place]. Topic: Referral - Question >> May 24, 2017  9:57 AM Scherrie Gerlach wrote: Reason for CRM: pt states she received a letter from [facility] and she doesn't know why she is getting this. Pt called [facility] and was told a Gerald Stabs would handle this and chris was not available. Pt has no idea why she has this referral anyway

## 2017-05-31 DIAGNOSIS — Z1211 Encounter for screening for malignant neoplasm of colon: Secondary | ICD-10-CM | POA: Diagnosis not present

## 2017-05-31 DIAGNOSIS — Z1212 Encounter for screening for malignant neoplasm of rectum: Secondary | ICD-10-CM | POA: Diagnosis not present

## 2017-05-31 LAB — COLOGUARD: Cologuard: NEGATIVE

## 2017-06-02 DIAGNOSIS — L821 Other seborrheic keratosis: Secondary | ICD-10-CM | POA: Diagnosis not present

## 2017-06-02 DIAGNOSIS — Z1283 Encounter for screening for malignant neoplasm of skin: Secondary | ICD-10-CM | POA: Diagnosis not present

## 2017-06-09 ENCOUNTER — Telehealth: Payer: Self-pay | Admitting: *Deleted

## 2017-06-09 NOTE — Telephone Encounter (Signed)
Received results from [facility]; forwarded to provider/SLS 12/07

## 2017-06-13 DIAGNOSIS — H353112 Nonexudative age-related macular degeneration, right eye, intermediate dry stage: Secondary | ICD-10-CM | POA: Diagnosis not present

## 2017-06-13 DIAGNOSIS — H43813 Vitreous degeneration, bilateral: Secondary | ICD-10-CM | POA: Diagnosis not present

## 2017-06-13 DIAGNOSIS — H353221 Exudative age-related macular degeneration, left eye, with active choroidal neovascularization: Secondary | ICD-10-CM | POA: Diagnosis not present

## 2017-06-20 ENCOUNTER — Encounter: Payer: Self-pay | Admitting: *Deleted

## 2017-07-15 ENCOUNTER — Other Ambulatory Visit: Payer: Self-pay | Admitting: Family Medicine

## 2017-07-19 ENCOUNTER — Encounter: Payer: Self-pay | Admitting: *Deleted

## 2017-07-26 ENCOUNTER — Telehealth: Payer: Self-pay | Admitting: Family Medicine

## 2017-07-26 NOTE — Telephone Encounter (Signed)
Prolia benefits received PA not required 20% co-insurance $35 copay   Patient may owe approximately $45 OOP  Patient due after 07/30/17  Letter mailed to inform patient of benefits and to schedule

## 2017-08-08 DIAGNOSIS — D3131 Benign neoplasm of right choroid: Secondary | ICD-10-CM | POA: Diagnosis not present

## 2017-08-08 DIAGNOSIS — H353112 Nonexudative age-related macular degeneration, right eye, intermediate dry stage: Secondary | ICD-10-CM | POA: Diagnosis not present

## 2017-08-08 DIAGNOSIS — H43813 Vitreous degeneration, bilateral: Secondary | ICD-10-CM | POA: Diagnosis not present

## 2017-08-08 DIAGNOSIS — H353221 Exudative age-related macular degeneration, left eye, with active choroidal neovascularization: Secondary | ICD-10-CM | POA: Diagnosis not present

## 2017-08-30 ENCOUNTER — Ambulatory Visit (INDEPENDENT_AMBULATORY_CARE_PROVIDER_SITE_OTHER): Payer: Medicare HMO

## 2017-08-30 DIAGNOSIS — M810 Age-related osteoporosis without current pathological fracture: Secondary | ICD-10-CM | POA: Diagnosis not present

## 2017-08-30 MED ORDER — DENOSUMAB 60 MG/ML ~~LOC~~ SOLN
60.0000 mg | Freq: Once | SUBCUTANEOUS | Status: AC
  Administered 2017-08-30: 60 mg via SUBCUTANEOUS

## 2017-08-30 NOTE — Progress Notes (Signed)
Pre visit review using our clinic review tool, if applicable. No additional management support is needed unless otherwise documented below in the visit note.  Pt here today for Prolia injection. Prolia 82mL injected in L arm. Pt tolerated injection well. Next Prolia 6 months.

## 2017-10-09 ENCOUNTER — Other Ambulatory Visit: Payer: Self-pay | Admitting: Family Medicine

## 2017-10-12 ENCOUNTER — Other Ambulatory Visit: Payer: Self-pay | Admitting: Family Medicine

## 2017-10-12 ENCOUNTER — Telehealth: Payer: Self-pay

## 2017-10-12 DIAGNOSIS — G4700 Insomnia, unspecified: Secondary | ICD-10-CM

## 2017-10-12 NOTE — Telephone Encounter (Signed)
PA initiated via Covermymeds; KEY: [id number]. Awaiting determination.

## 2017-10-12 NOTE — Telephone Encounter (Signed)
Requesting: AMBIEN 5 MG Contract: 08/13/12 UDS: 08/13/12 Low Risk Last OV:  05/04/17 Next OV:  11/06/17 Last Refill: 05/04/17  #90   Please advise

## 2017-10-13 NOTE — Telephone Encounter (Signed)
PA approved.   PA Case: [id number], Status: Approved, Coverage Starts on: 10/13/2017 12:00:00 AM, Coverage Ends on: 10/13/2019 12:00:00 AM. Questions? Contact [phone].

## 2017-10-17 DIAGNOSIS — H353221 Exudative age-related macular degeneration, left eye, with active choroidal neovascularization: Secondary | ICD-10-CM | POA: Diagnosis not present

## 2017-10-17 DIAGNOSIS — H353112 Nonexudative age-related macular degeneration, right eye, intermediate dry stage: Secondary | ICD-10-CM | POA: Diagnosis not present

## 2017-10-17 DIAGNOSIS — H43813 Vitreous degeneration, bilateral: Secondary | ICD-10-CM | POA: Diagnosis not present

## 2017-10-17 DIAGNOSIS — D3131 Benign neoplasm of right choroid: Secondary | ICD-10-CM | POA: Diagnosis not present

## 2017-11-06 ENCOUNTER — Encounter: Payer: Self-pay | Admitting: Family Medicine

## 2017-11-06 ENCOUNTER — Ambulatory Visit (INDEPENDENT_AMBULATORY_CARE_PROVIDER_SITE_OTHER): Payer: Medicare HMO | Admitting: Family Medicine

## 2017-11-06 VITALS — BP 118/70 | HR 60 | Temp 98.3°F | Resp 16 | Ht 64.0 in | Wt 145.4 lb

## 2017-11-06 DIAGNOSIS — E785 Hyperlipidemia, unspecified: Secondary | ICD-10-CM

## 2017-11-06 DIAGNOSIS — G5603 Carpal tunnel syndrome, bilateral upper limbs: Secondary | ICD-10-CM | POA: Diagnosis not present

## 2017-11-06 NOTE — Patient Instructions (Signed)

## 2017-11-06 NOTE — Assessment & Plan Note (Signed)
Tolerating statin, encouraged heart healthy diet, avoid trans fats, minimize simple carbs and saturated fats. Increase exercise as tolerated 

## 2017-11-06 NOTE — Addendum Note (Signed)
Addended by: Magdalene Molly A on: 11/06/2017 03:10 PM   Modules accepted: Orders

## 2017-11-06 NOTE — Progress Notes (Signed)
Patient ID: Joanna Williams, female   DOB: Nov 09, 1940, 77 y.o.   MRN: [id number]     Subjective:  I acted as a Education administrator for Dr. Carollee Herter.  Joanna Williams, [place]   Patient ID: Joanna Williams, female    DOB: [birth year], 77 y.o.   MRN: [id number]  Chief Complaint  Patient presents with  . Hyperlipidemia    HPI  Patient is in today for follow up cholesterol.  She has been doing well on current treatment. Pt dx with MD recently in L eye.  She is currently getting injections in the L eye.    Patient Care Team: Carollee Herter, Alferd Apa, DO as PCP - General Iona Beard, OD as Referring Physician (Optometry) Iona Beard, [place] as Referring Physician (Optometry) Sherlynn Stalls, MD as Consulting Physician (Ophthalmology)   Past Medical History:  Diagnosis Date  . GERD (gastroesophageal reflux disease)   . Hyperlipidemia     Past Surgical History:  Procedure Laterality Date  . COLONOSCOPY  2 yrs ago  . EYE SURGERY Bilateral 2014   cataract  . TOTAL HIP ARTHROPLASTY  08/10/2011   Procedure: TOTAL HIP ARTHROPLASTY;  Surgeon: Kerin Salen, MD;  Location: [facility];  Service: Orthopedics;  Laterality: Right;  DEPUY/ PENNACLE POLY OR CERAMIC    Family History  Problem Relation Age of Onset  . Heart disease Mother   . Cancer Father 61       brain    Social History   Socioeconomic History  . Marital status: Married    Spouse name: Not on file  . Number of children: Not on file  . Years of education: Not on file  . Highest education level: Not on file  Occupational History  . Occupation: retired    Fish farm manager: RETIRED  Social Needs  . Financial resource strain: Not on file  . Food insecurity:    Worry: Not on file    Inability: Not on file  . Transportation needs:    Medical: Not on file    Non-medical: Not on file  Tobacco Use  . Smoking status: Never Smoker  . Smokeless tobacco: Never Used  Substance and Sexual Activity  . Alcohol use: Yes  . Drug use: Yes    Types: Hydrocodone  .  Sexual activity: Yes    Partners: Male  Lifestyle  . Physical activity:    Days per week: Not on file    Minutes per session: Not on file  . Stress: Not on file  Relationships  . Social connections:    Talks on phone: Not on file    Gets together: Not on file    Attends religious service: Not on file    Active member of club or organization: Not on file    Attends meetings of clubs or organizations: Not on file    Relationship status: Not on file  . Intimate partner violence:    Fear of current or ex partner: Not on file    Emotionally abused: Not on file    Physically abused: Not on file    Forced sexual activity: Not on file  Other Topics Concern  . Not on file  Social History Narrative   Walking, yoga    Outpatient Medications Prior to Visit  Medication Sig Dispense Refill  . atorvastatin (LIPITOR) 20 MG tablet TAKE 1 TABLET BY MOUTH EVERY DAY 90 tablet 1  . denosumab (PROLIA) 60 MG/ML SOLN injection Inject 60 mg into the skin every 6 (six) months. Administer  in upper arm, thigh, or abdomen    . diclofenac sodium (VOLTAREN) 1 % GEL Apply 4 g topically 4 (four) times daily. 100 g 2  . Fish Oil-Cholecalciferol ([employer] FISH OIL + D3) 1200-1000 MG-UNIT CAPS 2 po qd 90 capsule   . mirabegron ER (MYRBETRIQ) 25 MG TB24 tablet Take 1 tablet (25 mg total) by mouth daily. 30 tablet 2  . Multiple Vitamins-Minerals (MULTIVITAMIN ADULT PO) Take 1 tablet by mouth daily.    Marland Kitchen nystatin (MYCOSTATIN) powder Apply topically 4 (four) times daily. 30 g 2  . omeprazole (PRILOSEC) 20 MG capsule Take 1 capsule (20 mg total) by mouth daily. 90 capsule 3  . zolpidem (AMBIEN) 5 MG tablet TAKE 1 TABLET BY MOUTH AT BEDTIME AS NEEDED 90 tablet 0   No facility-administered medications prior to visit.     Allergies  Allergen Reactions  . Niacin     REACTION: Nausea    Review of Systems  Constitutional: Negative for fever and malaise/fatigue.  HENT: Negative for congestion.   Eyes: Negative for  blurred vision.  Respiratory: Negative for cough and shortness of breath.   Cardiovascular: Negative for chest pain, palpitations and leg swelling.  Gastrointestinal: Negative for vomiting.  Musculoskeletal: Negative for back pain.  Skin: Negative for rash.  Neurological: Negative for loss of consciousness and headaches.       Objective:    Physical Exam  Constitutional: She is oriented to person, place, and time. She appears well-developed and well-nourished.  HENT:  Head: Normocephalic and atraumatic.  Eyes: Conjunctivae and EOM are normal.  Neck: Normal range of motion. Neck supple. No JVD present. Carotid bruit is not present. No thyromegaly present.  Cardiovascular: Normal rate, regular rhythm and normal heart sounds.  No murmur heard. Pulmonary/Chest: Effort normal and breath sounds normal. No respiratory distress. She has no wheezes. She has no rales. She exhibits no tenderness.  Musculoskeletal: She exhibits no edema.  Neurological: She is alert and oriented to person, place, and time.  Numbness an tingling in both hands  R>L  Psychiatric: She has a normal mood and affect.  Nursing note and vitals reviewed.   BP 118/70 (BP Location: Right Arm, Cuff Size: Normal)   Pulse 60   Temp 98.3 F (36.8 C) (Oral)   Resp 16   Ht 5\' 4"  (1.626 m)   Wt 145 lb 6.4 oz (66 kg)   SpO2 97%   BMI 24.96 kg/m  Wt Readings from Last 3 Encounters:  11/06/17 145 lb 6.4 oz (66 kg)  05/04/17 143 lb (64.9 kg)  07/15/16 144 lb (65.3 kg)   BP Readings from Last 3 Encounters:  11/06/17 118/70  05/04/17 138/80  07/15/16 130/78     Immunization History  Administered Date(s) Administered  . Influenza Split 04/18/2011  . Influenza Whole 04/18/2007, 04/15/2008  . Influenza, High Dose Seasonal PF 03/19/2015, 05/03/2016, 05/11/2016  . Influenza-Unspecified 05/04/2014, 04/10/2017  . Pneumococcal Conjugate-13 03/19/2015  . Pneumococcal Polysaccharide-23 09/02/2013  . Zoster 06/25/2008      Health Maintenance  Topic Date Due  . Samul Dada  12/24/1958  . INFLUENZA VACCINE  02/01/2018  . MAMMOGRAM  04/10/2018  . DEXA SCAN  Completed  . PNA vac Low Risk Adult  Completed    Lab Results  Component Value Date   WBC 6.3 05/04/2017   HGB 14.2 05/04/2017   HCT 42.6 05/04/2017   PLT 241.0 05/04/2017   GLUCOSE 90 05/04/2017   CHOL 179 05/04/2017   TRIG 148.0 05/04/2017  HDL 48.00 05/04/2017   LDLDIRECT 208.0 09/17/2015   LDLCALC 101 (H) 05/04/2017   ALT 12 05/04/2017   AST 18 05/04/2017   NA 140 05/04/2017   K 4.2 05/04/2017   CL 104 05/04/2017   CREATININE 0.59 05/04/2017   BUN 15 05/04/2017   CO2 31 05/04/2017   TSH 2.34 06/15/2010   INR 1.50 (H) 08/13/2011   HGBA1C 5.8 08/13/2012   MICROALBUR <0.7 03/19/2015    Lab Results  Component Value Date   TSH 2.34 06/15/2010   Lab Results  Component Value Date   WBC 6.3 05/04/2017   HGB 14.2 05/04/2017   HCT 42.6 05/04/2017   MCV 95.1 05/04/2017   PLT 241.0 05/04/2017   Lab Results  Component Value Date   NA 140 05/04/2017   K 4.2 05/04/2017   CO2 31 05/04/2017   GLUCOSE 90 05/04/2017   BUN 15 05/04/2017   CREATININE 0.59 05/04/2017   BILITOT 0.5 05/04/2017   ALKPHOS 61 05/04/2017   AST 18 05/04/2017   ALT 12 05/04/2017   PROT 6.8 05/04/2017   ALBUMIN 4.2 05/04/2017   CALCIUM 8.9 05/04/2017   GFR 104.95 05/04/2017   Lab Results  Component Value Date   CHOL 179 05/04/2017   Lab Results  Component Value Date   HDL 48.00 05/04/2017   Lab Results  Component Value Date   LDLCALC 101 (H) 05/04/2017   Lab Results  Component Value Date   TRIG 148.0 05/04/2017   Lab Results  Component Value Date   CHOLHDL 4 05/04/2017   Lab Results  Component Value Date   HGBA1C 5.8 08/13/2012         Assessment & Plan:   Problem List Items Addressed This Visit      Unprioritized   Carpal tunnel syndrome on both sides    Splint  Refer to ortho-- Dr Amedeo Plenty      Relevant Orders   Ambulatory  referral to Ophthalmology   Hyperlipidemia LDL goal <100 - Primary    Tolerating statin, encouraged heart healthy diet, avoid trans fats, minimize simple carbs and saturated fats. Increase exercise as tolerated      Relevant Orders   Comprehensive metabolic panel   Lipid panel      I am having [name] maintain her Multiple Vitamins-Minerals (MULTIVITAMIN ADULT PO), CVS FISH OIL + D3, nystatin, diclofenac sodium, mirabegron ER, denosumab, omeprazole, atorvastatin, and zolpidem.  No orders of the defined types were placed in this encounter.   CMA served as Education administrator during this visit. History, Physical and Plan performed by medical provider. Documentation and orders reviewed and attested to.  Ann Held, DO

## 2017-11-06 NOTE — Assessment & Plan Note (Signed)
Splint  Refer to ortho-- Dr Amedeo Plenty

## 2017-11-08 ENCOUNTER — Other Ambulatory Visit (INDEPENDENT_AMBULATORY_CARE_PROVIDER_SITE_OTHER): Payer: Medicare HMO

## 2017-11-08 DIAGNOSIS — E785 Hyperlipidemia, unspecified: Secondary | ICD-10-CM | POA: Diagnosis not present

## 2017-11-08 LAB — LIPID PANEL
Cholesterol: 191 mg/dL (ref 0–200)
HDL: 61.5 mg/dL (ref >39.00)
LDL CALC: 110 mg/dL — AB (ref 0–99)
NONHDL: 129.08
Total CHOL/HDL Ratio: 3
Triglycerides: 96 mg/dL (ref 0.0–149.0)
VLDL: 19.2 mg/dL (ref 0.0–40.0)

## 2017-11-08 LAB — COMPREHENSIVE METABOLIC PANEL
ALBUMIN: 4 g/dL (ref 3.5–5.2)
ALK PHOS: 61 U/L (ref 39–117)
ALT: 15 U/L (ref 0–35)
AST: 21 U/L (ref 0–37)
BUN: 19 mg/dL (ref 6–23)
CHLORIDE: 104 meq/L (ref 96–112)
CO2: 32 mEq/L (ref 19–32)
Calcium: 9 mg/dL (ref 8.4–10.5)
Creatinine, Ser: 0.64 mg/dL (ref 0.40–1.20)
GFR: 95.42 mL/min (ref >60.00)
GLUCOSE: 77 mg/dL (ref 70–99)
Potassium: 4 mEq/L (ref 3.5–5.1)
SODIUM: 142 meq/L (ref 135–145)
TOTAL PROTEIN: 6.5 g/dL (ref 6.0–8.3)
Total Bilirubin: 0.5 mg/dL (ref 0.2–1.2)

## 2017-11-21 ENCOUNTER — Telehealth: Payer: Self-pay | Admitting: *Deleted

## 2017-11-21 NOTE — Telephone Encounter (Signed)
Copied from [place] [id number]. Topic: General - Other >> Nov 20, 2017 12:49 PM Yvette Rack wrote: Reason for CRM: Pt returned call for lab results. Pt request call back at cell#

## 2017-11-22 NOTE — Telephone Encounter (Signed)
Cholesterol--- LDL goal < 100, HDL >40, TG < 150. Diet and exercise will increase HDL and decrease LDL and TG. Fish, Fish Oil, Flaxseed oil will also help increase the HDL and decrease Triglycerides.  Recheck labs in 6 months at your office visit.

## 2017-11-22 NOTE — Telephone Encounter (Signed)
Left message with husband to call back.

## 2017-12-20 DIAGNOSIS — M79641 Pain in right hand: Secondary | ICD-10-CM | POA: Diagnosis not present

## 2017-12-20 DIAGNOSIS — G5603 Carpal tunnel syndrome, bilateral upper limbs: Secondary | ICD-10-CM | POA: Diagnosis not present

## 2017-12-20 DIAGNOSIS — M79642 Pain in left hand: Secondary | ICD-10-CM | POA: Diagnosis not present

## 2017-12-26 DIAGNOSIS — H353221 Exudative age-related macular degeneration, left eye, with active choroidal neovascularization: Secondary | ICD-10-CM | POA: Diagnosis not present

## 2017-12-26 DIAGNOSIS — H43813 Vitreous degeneration, bilateral: Secondary | ICD-10-CM | POA: Diagnosis not present

## 2017-12-26 DIAGNOSIS — H353112 Nonexudative age-related macular degeneration, right eye, intermediate dry stage: Secondary | ICD-10-CM | POA: Diagnosis not present

## 2017-12-26 DIAGNOSIS — D3131 Benign neoplasm of right choroid: Secondary | ICD-10-CM | POA: Diagnosis not present

## 2018-01-31 ENCOUNTER — Telehealth: Payer: Self-pay | Admitting: Family Medicine

## 2018-01-31 NOTE — Telephone Encounter (Signed)
Prolia benefits received PA not required 20% Prolia and admin   Patient may owe approximately $59 OOP  Patient due after 02/26/18  Letter mailed to inform patient of benefits and to schedule

## 2018-02-08 DIAGNOSIS — M5417 Radiculopathy, lumbosacral region: Secondary | ICD-10-CM | POA: Diagnosis not present

## 2018-02-08 DIAGNOSIS — M545 Low back pain: Secondary | ICD-10-CM | POA: Diagnosis not present

## 2018-02-08 DIAGNOSIS — M5136 Other intervertebral disc degeneration, lumbar region: Secondary | ICD-10-CM | POA: Diagnosis not present

## 2018-02-08 DIAGNOSIS — M48061 Spinal stenosis, lumbar region without neurogenic claudication: Secondary | ICD-10-CM | POA: Diagnosis not present

## 2018-02-08 NOTE — Telephone Encounter (Signed)
Patient agreed to prolia injection.  Can we please order.  Patient scheduled for 03/13/18

## 2018-02-13 ENCOUNTER — Other Ambulatory Visit: Payer: Self-pay | Admitting: Family Medicine

## 2018-02-13 DIAGNOSIS — G4700 Insomnia, unspecified: Secondary | ICD-10-CM

## 2018-02-13 NOTE — Telephone Encounter (Signed)
Last filled per database: 10/17/17 Last written: 10/12/17 Last ov: 11/06/17 Next ov:  05/10/18 Contract: will get at 05/10/18 visit UDS: will get at 05/10/18 visit

## 2018-02-14 DIAGNOSIS — M79641 Pain in right hand: Secondary | ICD-10-CM | POA: Diagnosis not present

## 2018-02-14 DIAGNOSIS — G5603 Carpal tunnel syndrome, bilateral upper limbs: Secondary | ICD-10-CM | POA: Diagnosis not present

## 2018-02-14 DIAGNOSIS — M79642 Pain in left hand: Secondary | ICD-10-CM | POA: Diagnosis not present

## 2018-02-19 NOTE — Telephone Encounter (Signed)
Medication received for pt. 

## 2018-03-13 ENCOUNTER — Other Ambulatory Visit: Payer: Self-pay | Admitting: Family Medicine

## 2018-03-13 ENCOUNTER — Ambulatory Visit (INDEPENDENT_AMBULATORY_CARE_PROVIDER_SITE_OTHER): Payer: Medicare HMO

## 2018-03-13 DIAGNOSIS — Z1231 Encounter for screening mammogram for malignant neoplasm of breast: Secondary | ICD-10-CM

## 2018-03-13 DIAGNOSIS — M810 Age-related osteoporosis without current pathological fracture: Secondary | ICD-10-CM

## 2018-03-13 MED ORDER — DENOSUMAB 60 MG/ML ~~LOC~~ SOSY
60.0000 mg | PREFILLED_SYRINGE | Freq: Once | SUBCUTANEOUS | Status: AC
  Administered 2018-03-13: 60 mg via SUBCUTANEOUS

## 2018-03-13 NOTE — Progress Notes (Signed)
Pre visit review using our clinic tool,if applicable. No additional management support is needed unless otherwise documented below in the visit note.   Patient in for Prolia injection. Given SQ left arm,patient tolerated well. No complaints voiced this visit.  Reminder card given for next injection.

## 2018-03-21 DIAGNOSIS — D3131 Benign neoplasm of right choroid: Secondary | ICD-10-CM | POA: Diagnosis not present

## 2018-03-21 DIAGNOSIS — H353112 Nonexudative age-related macular degeneration, right eye, intermediate dry stage: Secondary | ICD-10-CM | POA: Diagnosis not present

## 2018-03-21 DIAGNOSIS — H35421 Microcystoid degeneration of retina, right eye: Secondary | ICD-10-CM | POA: Diagnosis not present

## 2018-03-21 DIAGNOSIS — H353221 Exudative age-related macular degeneration, left eye, with active choroidal neovascularization: Secondary | ICD-10-CM | POA: Diagnosis not present

## 2018-04-05 ENCOUNTER — Other Ambulatory Visit: Payer: Self-pay | Admitting: Family Medicine

## 2018-04-18 ENCOUNTER — Ambulatory Visit (HOSPITAL_BASED_OUTPATIENT_CLINIC_OR_DEPARTMENT_OTHER)
Admission: RE | Admit: 2018-04-18 | Discharge: 2018-04-18 | Disposition: A | Discharge status: Home / Self Care | Payer: Medicare HMO | Source: Ambulatory Visit | Attending: Family Medicine | Admitting: Family Medicine

## 2018-04-18 DIAGNOSIS — Z1231 Encounter for screening mammogram for malignant neoplasm of breast: Secondary | ICD-10-CM | POA: Diagnosis not present

## 2018-05-02 DIAGNOSIS — G5603 Carpal tunnel syndrome, bilateral upper limbs: Secondary | ICD-10-CM | POA: Diagnosis not present

## 2018-05-02 DIAGNOSIS — M79641 Pain in right hand: Secondary | ICD-10-CM | POA: Diagnosis not present

## 2018-05-02 DIAGNOSIS — M79642 Pain in left hand: Secondary | ICD-10-CM | POA: Diagnosis not present

## 2018-05-10 ENCOUNTER — Encounter: Payer: Self-pay | Admitting: *Deleted

## 2018-05-10 ENCOUNTER — Other Ambulatory Visit: Payer: Self-pay

## 2018-05-10 ENCOUNTER — Encounter: Payer: Self-pay | Admitting: Family Medicine

## 2018-05-10 ENCOUNTER — Ambulatory Visit (INDEPENDENT_AMBULATORY_CARE_PROVIDER_SITE_OTHER): Payer: Medicare HMO | Admitting: Family Medicine

## 2018-05-10 VITALS — BP 140/70 | HR 72 | Temp 98.3°F | Resp 14 | Ht 64.0 in | Wt 144.0 lb

## 2018-05-10 DIAGNOSIS — Z23 Encounter for immunization: Secondary | ICD-10-CM | POA: Diagnosis not present

## 2018-05-10 DIAGNOSIS — K219 Gastro-esophageal reflux disease without esophagitis: Secondary | ICD-10-CM

## 2018-05-10 DIAGNOSIS — E785 Hyperlipidemia, unspecified: Secondary | ICD-10-CM

## 2018-05-10 DIAGNOSIS — M5440 Lumbago with sciatica, unspecified side: Secondary | ICD-10-CM | POA: Diagnosis not present

## 2018-05-10 DIAGNOSIS — G4700 Insomnia, unspecified: Secondary | ICD-10-CM | POA: Diagnosis not present

## 2018-05-10 DIAGNOSIS — Z79899 Other long term (current) drug therapy: Secondary | ICD-10-CM

## 2018-05-10 DIAGNOSIS — M545 Low back pain, unspecified: Secondary | ICD-10-CM

## 2018-05-10 DIAGNOSIS — Z0000 Encounter for general adult medical examination without abnormal findings: Secondary | ICD-10-CM | POA: Diagnosis not present

## 2018-05-10 LAB — COMPREHENSIVE METABOLIC PANEL
ALT: 12 U/L (ref 0–35)
AST: 18 U/L (ref 0–37)
Albumin: 4.5 g/dL (ref 3.5–5.2)
Alkaline Phosphatase: 63 U/L (ref 39–117)
BUN: 17 mg/dL (ref 6–23)
CHLORIDE: 102 meq/L (ref 96–112)
CO2: 31 meq/L (ref 19–32)
Calcium: 9.1 mg/dL (ref 8.4–10.5)
Creatinine, Ser: 0.65 mg/dL (ref 0.40–1.20)
GFR: 93.6 mL/min (ref >60.00)
Glucose, Bld: 95 mg/dL (ref 70–99)
POTASSIUM: 4 meq/L (ref 3.5–5.1)
Sodium: 140 mEq/L (ref 135–145)
Total Bilirubin: 0.6 mg/dL (ref 0.2–1.2)
Total Protein: 6.5 g/dL (ref 6.0–8.3)

## 2018-05-10 LAB — LIPID PANEL
CHOL/HDL RATIO: 4
Cholesterol: 193 mg/dL (ref 0–200)
HDL: 54.7 mg/dL (ref >39.00)
LDL CALC: 103 mg/dL — AB (ref 0–99)
NONHDL: 138.25
Triglycerides: 177 mg/dL — ABNORMAL HIGH (ref 0.0–149.0)
VLDL: 35.4 mg/dL (ref 0.0–40.0)

## 2018-05-10 MED ORDER — ZOLPIDEM TARTRATE 5 MG PO TABS: ORAL_TABLET | ORAL | 0 refills | Status: DC

## 2018-05-10 MED ORDER — OMEPRAZOLE 20 MG PO CPDR: 20.0000 mg | DELAYED_RELEASE_CAPSULE | Freq: Every day | ORAL | 3 refills | Status: DC

## 2018-05-10 MED ORDER — GABAPENTIN 100 MG PO CAPS: 100.0000 mg | ORAL_CAPSULE | Freq: Every day | ORAL | 1 refills | Status: DC

## 2018-05-10 NOTE — Addendum Note (Signed)
Addended by: Raynelle Dick R on: 05/10/2018 12:18 PM   Modules accepted: Orders

## 2018-05-10 NOTE — Progress Notes (Signed)
Subjective:     Joanna Williams is a 77 y.o. female and is here for a comprehensive physical exam. The patient reports no new problems.  seeing [facility] [facility] for back and CTS.  Social History   Socioeconomic History  . Marital status: Married    Spouse name: Not on file  . Number of children: Not on file  . Years of education: Not on file  . Highest education level: Not on file  Occupational History  . Occupation: retired    Fish farm manager: RETIRED  Social Needs  . Financial resource strain: Not hard at all  . Food insecurity:    Worry: Never true    Inability: Never true  . Transportation needs:    Medical: No    Non-medical: No  Tobacco Use  . Smoking status: Never Smoker  . Smokeless tobacco: Never Used  Substance and Sexual Activity  . Alcohol use: Yes    Alcohol/week: 1.0 standard drinks    Types: 1 Glasses of wine per week  . Drug use: Yes    Types: Hydrocodone  . Sexual activity: Yes    Partners: Male  Lifestyle  . Physical activity:    Days per week: 3 days    Minutes per session: 120 min  . Stress: Only a little  Relationships  . Social connections:    Talks on phone: Three times a week    Gets together: Twice a week    Attends religious service: Not on file    Active member of club or organization: Yes    Attends meetings of clubs or organizations: More than 4 times per year    Relationship status: Married  . Intimate partner violence:    Fear of current or ex partner: No    Emotionally abused: No    Physically abused: No    Forced sexual activity: No  Other Topics Concern  . Not on file  Social History Narrative   Walking, yoga, pickle ball   Health Maintenance  Topic Date Due  . TETANUS/TDAP  12/24/1958  . INFLUENZA VACCINE  02/01/2018  . MAMMOGRAM  04/19/2019  . DEXA SCAN  Completed  . PNA vac Low Risk Adult  Completed    The following portions of the patient's history were reviewed and updated as appropriate: She  has a past medical history of  GERD (gastroesophageal reflux disease), Hyperlipidemia, and Macular degeneration (05/04/2017). She does not have any pertinent problems on file. She  has a past surgical history that includes Colonoscopy (2 yrs ago); Total hip arthroplasty (08/10/2011); and Eye surgery (Bilateral, 2014). Her family history includes Cancer (age of onset: 16) in her father; Heart disease in her mother. She  reports that she has never smoked. She has never used smokeless tobacco. She reports that she drinks about 1.0 standard drinks of alcohol per week. She reports that she has current or past drug history. Drug: Hydrocodone. She has a current medication list which includes the following prescription(s): atorvastatin, denosumab, gabapentin, multiple vitamins-minerals, omeprazole, zolpidem, and neomycin-polymyxin b-dexamethasone. Current Outpatient Medications on File Prior to Visit  Medication Sig Dispense Refill  . atorvastatin (LIPITOR) 20 MG tablet Take 1 tablet (20 mg total) by mouth daily. 90 tablet 1  . denosumab (PROLIA) 60 MG/ML SOLN injection Inject 60 mg into the skin every 6 (six) months. Administer in upper arm, thigh, or abdomen    . Multiple Vitamins-Minerals (PRESERVISION AREDS 2+MULTI VIT PO) Take 2 tablets by mouth daily.    Marland Kitchen neomycin-polymyxin  b-dexamethasone (MAXITROL) 3.5-10000-0.1 OINT      No current facility-administered medications on file prior to visit.    She is allergic to niacin..  Review of Systems Review of Systems  Constitutional: Negative for activity change, appetite change and fatigue.  HENT: Negative for hearing loss, congestion, tinnitus and ear discharge.  dentist q48m Eyes: Negative for visual disturbance (see optho q1y -- vision corrected to 20/20 with glasses).  Respiratory: Negative for cough, chest tightness and shortness of breath.   Cardiovascular: Negative for chest pain, palpitations and leg swelling.  Gastrointestinal: Negative for abdominal pain, diarrhea,  constipation and abdominal distention.  Genitourinary: Negative for urgency, frequency, decreased urine volume and difficulty urinating.  Musculoskeletal:  Skin: Negative for color change, pallor and rash.  Neurological: Negative for dizziness, light-headedness, numbness and headaches.  Hematological: Negative for adenopathy. Does not bruise/bleed easily.  Psychiatric/Behavioral: Negative for suicidal ideas, confusion, sleep disturbance, self-injury, dysphoric mood, decreased concentration and agitation.       Objective:    BP 140/70 (BP Location: Right Arm, Patient Position: Sitting, Cuff Size: Normal)   Pulse 72   Temp 98.3 F (36.8 C)   Resp 14   Ht 5\' 4"  (1.626 m)   Wt 144 lb (65.3 kg)   SpO2 97%   BMI 24.72 kg/m  General appearance: alert, cooperative, appears stated age and no distress Head: Normocephalic, without obvious abnormality, atraumatic Eyes: conjunctivae/corneas clear. PERRL, EOM's intact. Fundi benign. Ears: normal TM's and external ear canals both ears Nose: Nares normal. Septum midline. Mucosa normal. No drainage or sinus tenderness. Throat: lips, mucosa, and tongue normal; teeth and gums normal Neck: no adenopathy, no carotid bruit, no JVD, supple, symmetrical, trachea midline and thyroid not enlarged, symmetric, no tenderness/mass/nodules Back: symmetric, no curvature. ROM normal. No CVA tenderness. Lungs: clear to auscultation bilaterally Breasts: normal appearance, no masses or tenderness Heart: regular rate and rhythm, S1, S2 normal, no murmur, click, rub or gallop Abdomen: soft, non-tender; bowel sounds normal; no masses,  no organomegaly Pelvic: not indicated; post-menopausal, no abnormal Pap smears in past Extremities: extremities normal, atraumatic, no cyanosis or edema Pulses: 2+ and symmetric Skin: Skin color, texture, turgor normal. No rashes or lesions Lymph nodes: Cervical, supraclavicular, and axillary nodes normal. Neurologic: Alert and  oriented X 3, normal strength and tone. Normal symmetric reflexes. Normal coordination and gait    Assessment:    Healthy female exam.      Plan:    ghm utd Check labs  See After Visit Summary for Counseling Recommendations    1. Insomnia, unspecified type Stable  - zolpidem (AMBIEN) 5 MG tablet; TAKE 1 TABLET BY MOUTH EVERY DAY AT BEDTIME AS NEEDED  Dispense: 90 tablet; Refill: 0  2. Gastroesophageal reflux disease, esophagitis presence not specified stable - omeprazole (PRILOSEC) 20 MG capsule; Take 1 capsule (20 mg total) by mouth daily.  Dispense: 90 capsule; Refill: 3  3. Low back pain with radiation Per ortho - gabapentin (NEURONTIN) 100 MG capsule; Take 1 capsule (100 mg total) by mouth at bedtime.  Dispense: 90 capsule; Refill: 1  4. Preventative health care See above  5. Hyperlipidemia, unspecified hyperlipidemia type Encouraged heart healthy diet, increase exercise, avoid trans fats, consider a krill oil cap daily - Lipid panel - Comprehensive metabolic panel

## 2018-05-10 NOTE — Patient Instructions (Signed)
Preventive Care 65 Years and Older, Female Preventive care refers to lifestyle choices and visits with your health care provider that can promote health and wellness. What does preventive care include?  A yearly physical exam. This is also called an annual well check.  Dental exams once or twice a year.  Routine eye exams. Ask your health care provider how often you should have your eyes checked.  Personal lifestyle choices, including: ? Daily care of your teeth and gums. ? Regular physical activity. ? Eating a healthy diet. ? Avoiding tobacco and drug use. ? Limiting alcohol use. ? Practicing safe sex. ? Taking low-dose aspirin every day. ? Taking vitamin and mineral supplements as recommended by your health care provider. What happens during an annual well check? The services and screenings done by your health care provider during your annual well check will depend on your age, overall health, lifestyle risk factors, and family history of disease. Counseling Your health care provider may ask you questions about your:  Alcohol use.  Tobacco use.  Drug use.  Emotional well-being.  Home and relationship well-being.  Sexual activity.  Eating habits.  History of falls.  Memory and ability to understand (cognition).  Work and work environment.  Reproductive health.  Screening You may have the following tests or measurements:  Height, weight, and BMI.  Blood pressure.  Lipid and cholesterol levels. These may be checked every 5 years, or more frequently if you are over 50 years old.  Skin check.  Lung cancer screening. You may have this screening every year starting at age 55 if you have a 30-pack-year history of smoking and currently smoke or have quit within the past 15 years.  Fecal occult blood test (FOBT) of the stool. You may have this test every year starting at age 50.  Flexible sigmoidoscopy or colonoscopy. You may have a sigmoidoscopy every 5 years or  a colonoscopy every 10 years starting at age 50.  Hepatitis C blood test.  Hepatitis B blood test.  Sexually transmitted disease (STD) testing.  Diabetes screening. This is done by checking your blood sugar (glucose) after you have not eaten for a while (fasting). You may have this done every 1-3 years.  Bone density scan. This is done to screen for osteoporosis. You may have this done starting at age 65.  Mammogram. This may be done every 1-2 years. Talk to your health care provider about how often you should have regular mammograms.  Talk with your health care provider about your test results, treatment options, and if necessary, the need for more tests. Vaccines Your health care provider may recommend certain vaccines, such as:  Influenza vaccine. This is recommended every year.  Tetanus, diphtheria, and acellular pertussis (Tdap, Td) vaccine. You may need a Td booster every 10 years.  Varicella vaccine. You may need this if you have not been vaccinated.  Zoster vaccine. You may need this after age 60.  Measles, mumps, and rubella (MMR) vaccine. You may need at least one dose of MMR if you were born in 1957 or later. You may also need a second dose.  Pneumococcal 13-valent conjugate (PCV13) vaccine. One dose is recommended after age 65.  Pneumococcal polysaccharide (PPSV23) vaccine. One dose is recommended after age 65.  Meningococcal vaccine. You may need this if you have certain conditions.  Hepatitis A vaccine. You may need this if you have certain conditions or if you travel or work in places where you may be exposed to hepatitis   A.  Hepatitis B vaccine. You may need this if you have certain conditions or if you travel or work in places where you may be exposed to hepatitis B.  Haemophilus influenzae type b (Hib) vaccine. You may need this if you have certain conditions.  Talk to your health care provider about which screenings and vaccines you need and how often you  need them. This information is not intended to replace advice given to you by your health care provider. Make sure you discuss any questions you have with your health care provider. Document Released: 07/17/2015 Document Revised: 03/09/2016 Document Reviewed: 04/21/2015 Elsevier Interactive Patient Education  2018 [employer]vier Inc.  

## 2018-05-11 DIAGNOSIS — M79642 Pain in left hand: Secondary | ICD-10-CM | POA: Diagnosis not present

## 2018-05-11 DIAGNOSIS — Z79899 Other long term (current) drug therapy: Secondary | ICD-10-CM | POA: Diagnosis not present

## 2018-05-11 DIAGNOSIS — G5603 Carpal tunnel syndrome, bilateral upper limbs: Secondary | ICD-10-CM | POA: Diagnosis not present

## 2018-05-11 DIAGNOSIS — M79641 Pain in right hand: Secondary | ICD-10-CM | POA: Diagnosis not present

## 2018-05-11 DIAGNOSIS — G4700 Insomnia, unspecified: Secondary | ICD-10-CM | POA: Diagnosis not present

## 2018-05-11 NOTE — Addendum Note (Signed)
Addended by: Kem Boroughs D on: 05/11/2018 09:39 AM   Modules accepted: Orders

## 2018-05-12 LAB — PAIN MGMT, PROFILE 8 W/CONF, U
6 Acetylmorphine: NEGATIVE ng/mL (ref <10)
Alcohol Metabolites: NEGATIVE ng/mL (ref <500)
Amphetamines: NEGATIVE ng/mL (ref <500)
BENZODIAZEPINES: NEGATIVE ng/mL (ref <100)
Buprenorphine, Urine: NEGATIVE ng/mL (ref <5)
Cocaine Metabolite: NEGATIVE ng/mL (ref <150)
Creatinine: 54.5 mg/dL
MDMA: NEGATIVE ng/mL (ref <500)
Marijuana Metabolite: NEGATIVE ng/mL (ref <20)
Opiates: NEGATIVE ng/mL (ref <100)
Oxidant: NEGATIVE ug/mL (ref <200)
Oxycodone: NEGATIVE ng/mL (ref <100)
PH: 7.06 (ref 4.5–9.0)

## 2018-05-16 NOTE — Progress Notes (Signed)
Low risk added to [facility]

## 2018-06-21 ENCOUNTER — Telehealth: Payer: Self-pay | Admitting: *Deleted

## 2018-06-21 NOTE — Telephone Encounter (Signed)
Copied from [place] [id number]. Topic: General - Other >> Jun 21, 2018  9:40 AM Keene Breath wrote: Reason for CRM: Patient called to speak with the nurse or doctor regarding her labs and physical she had a few weeks ago.  Patient stated that she never got any results from her physical and would like to speak with someone regarding her results.  Please advise and call back at [phone]

## 2018-06-22 NOTE — Telephone Encounter (Signed)
Patient's labs all normal-but only low risk was resulted. Please advise if there is anything I need to tel patient-I will be happy to call her.

## 2018-06-22 NOTE — Telephone Encounter (Signed)
Pt called back. Lab results given. Appt made.

## 2018-06-22 NOTE — Telephone Encounter (Signed)
Left message to return call. Ok for pec to discuss.  

## 2018-06-22 NOTE — Telephone Encounter (Signed)
Low risk was for uds Cholesterol--- LDL goal < 100,  HDL >40,  TG < 150.  Diet and exercise will increase HDL and decrease LDL and TG.  Fish,  Fish Oil, Flaxseed oil will also help increase the HDL and decrease Triglycerides.   Recheck labs in 3 months Lipid, cmp---  Lab only ok .

## 2018-06-26 ENCOUNTER — Ambulatory Visit (INDEPENDENT_AMBULATORY_CARE_PROVIDER_SITE_OTHER): Payer: Medicare HMO | Admitting: Medical

## 2018-06-26 ENCOUNTER — Encounter: Payer: Self-pay | Admitting: Medical

## 2018-06-26 VITALS — BP 144/70 | HR 63 | Temp 98.0°F | Resp 16 | Ht 64.0 in | Wt 144.0 lb

## 2018-06-26 DIAGNOSIS — M545 Low back pain, unspecified: Secondary | ICD-10-CM

## 2018-06-26 DIAGNOSIS — N390 Urinary tract infection, site not specified: Secondary | ICD-10-CM

## 2018-06-26 DIAGNOSIS — R319 Hematuria, unspecified: Secondary | ICD-10-CM | POA: Diagnosis not present

## 2018-06-26 DIAGNOSIS — M5440 Lumbago with sciatica, unspecified side: Secondary | ICD-10-CM | POA: Diagnosis not present

## 2018-06-26 DIAGNOSIS — R300 Dysuria: Secondary | ICD-10-CM | POA: Diagnosis not present

## 2018-06-26 LAB — POC URINALSYSI DIPSTICK (AUTOMATED)
Bilirubin, UA: NEGATIVE
Glucose, UA: NEGATIVE
Ketones, UA: NEGATIVE
NITRITE UA: NEGATIVE
PROTEIN UA: NEGATIVE
Spec Grav, UA: 1.01 (ref 1.010–1.025)
Urobilinogen, UA: NEGATIVE E.U./dL — AB
pH, UA: 7 (ref 5.0–8.0)

## 2018-06-26 MED ORDER — NITROFURANTOIN MONOHYD MACRO 100 MG PO CAPS: 100.0000 mg | ORAL_CAPSULE | Freq: Two times a day (BID) | ORAL | 0 refills | Status: DC

## 2018-06-26 MED ORDER — GABAPENTIN 100 MG PO CAPS: 100.0000 mg | ORAL_CAPSULE | Freq: Every day | ORAL | 1 refills | Status: DC

## 2018-06-26 MED ORDER — CEFTRIAXONE SODIUM 1 G IJ SOLR
1.0000 g | Freq: Once | INTRAMUSCULAR | Status: AC
  Administered 2018-06-26: 1 g via INTRAMUSCULAR

## 2018-06-26 NOTE — Patient Instructions (Addendum)
You appear to have a urinary tract infection. I am prescribing macrobid antibiotic for the probable infection. Hydrate well. I am sending out a urine culture. During the interim if your signs and symptoms worsen rather than improving please notify us. We will notify your when the culture results are back.  Since you have some left cva pain decided to give rocephin im pending culture result.   Follow up in 7 days or as needed.

## 2018-06-26 NOTE — Progress Notes (Signed)
Subjective:    Patient ID: Joanna Williams, female    DOB: May 07, 1941, 77 y.o.   MRN: [id number]  HPI Pt in today reporting urinary symptoms x 3-4 days.  Dysuria- yes. Still present. Frequent urination-yes. Hesitancy-no Suprapubic pressure-yes Fever-no chills-no Nausea-no Vomiting-no CVA pain-left side cva discomfort/achy History of UTI-yes Gross hematuria-no Urine did have strong odor.   Review of Systems  Constitutional: Negative for chills, fatigue and fever.  Respiratory: Negative for cough, chest tightness and wheezing.   Cardiovascular: Negative for chest pain and palpitations.  Gastrointestinal: Negative for abdominal pain.  Genitourinary: Positive for dysuria, frequency and urgency. Negative for difficulty urinating and vaginal pain.  Musculoskeletal:       Cva area pain.  Skin: Negative for rash.  Neurological: Negative for dizziness, seizures and headaches.  Hematological: Negative for adenopathy. Does not bruise/bleed easily.  Psychiatric/Behavioral: Negative for behavioral problems.    Past Medical History:  Diagnosis Date  . GERD (gastroesophageal reflux disease)   . Hyperlipidemia   . Macular degeneration 05/04/2017     Social History   Socioeconomic History  . Marital status: Married    Spouse name: Not on file  . Number of children: Not on file  . Years of education: Not on file  . Highest education level: Not on file  Occupational History  . Occupation: retired    Fish farm manager: RETIRED  Social Needs  . Financial resource strain: Not hard at all  . Food insecurity:    Worry: Never true    Inability: Never true  . Transportation needs:    Medical: No    Non-medical: No  Tobacco Use  . Smoking status: Never Smoker  . Smokeless tobacco: Never Used  Substance and Sexual Activity  . Alcohol use: Yes    Alcohol/week: 1.0 standard drinks    Types: 1 Glasses of wine per week  . Drug use: Yes    Types: Hydrocodone  . Sexual activity: Yes    Partners: Male  Lifestyle  . Physical activity:    Days per week: 3 days    Minutes per session: 120 min  . Stress: Only a little  Relationships  . Social connections:    Talks on phone: Three times a week    Gets together: Twice a week    Attends religious service: Not on file    Active member of club or organization: Yes    Attends meetings of clubs or organizations: More than 4 times per year    Relationship status: Married  . Intimate partner violence:    Fear of current or ex partner: No    Emotionally abused: No    Physically abused: No    Forced sexual activity: No  Other Topics Concern  . Not on file  Social History Narrative   Walking, yoga, pickle ball    Past Surgical History:  Procedure Laterality Date  . COLONOSCOPY  2 yrs ago  . EYE SURGERY Bilateral 2014   cataract  . TOTAL HIP ARTHROPLASTY  08/10/2011   Procedure: TOTAL HIP ARTHROPLASTY;  Surgeon: Kerin Salen, MD;  Location: [facility];  Service: Orthopedics;  Laterality: Right;  DEPUY/ PENNACLE POLY OR CERAMIC    Family History  Problem Relation Age of Onset  . Heart disease Mother   . Cancer Father 23       brain    Allergies  Allergen Reactions  . Niacin     REACTION: Nausea  . Sulfa Antibiotics  Current Outpatient Medications on File Prior to Visit  Medication Sig Dispense Refill  . atorvastatin (LIPITOR) 20 MG tablet Take 1 tablet (20 mg total) by mouth daily. 90 tablet 1  . denosumab (PROLIA) 60 MG/ML SOLN injection Inject 60 mg into the skin every 6 (six) months. Administer in upper arm, thigh, or abdomen    . gabapentin (NEURONTIN) 100 MG capsule Take 1 capsule (100 mg total) by mouth at bedtime. 90 capsule 1  . Multiple Vitamins-Minerals (PRESERVISION AREDS 2+MULTI VIT PO) Take 2 tablets by mouth daily.    Marland Kitchen neomycin-polymyxin b-dexamethasone (MAXITROL) 3.5-10000-0.1 OINT     . omeprazole (PRILOSEC) 20 MG capsule Take 1 capsule (20 mg total) by mouth daily. 90 capsule 3  . zolpidem  (AMBIEN) 5 MG tablet TAKE 1 TABLET BY MOUTH EVERY DAY AT BEDTIME AS NEEDED 90 tablet 0   No current facility-administered medications on file prior to visit.     Pulse 63   Temp 98 F (36.7 C) (Oral)   Resp 16   Ht 5\' 4"  (1.626 m)   Wt 144 lb (65.3 kg)   SpO2 97%   BMI 24.72 kg/m       Objective:   Physical Exam  General- No acute distress. Pleasant patient. Neck- Full range of motion, no jvd Lungs- Clear, even and unlabored. Heart- regular rate and rhythm. Neurologic- CNII- XII grossly intact. Abdomen- suprapubic pressure. Soft, nt, +bs.  Back- faint left cva tenderness.      Assessment & Plan:  You appear to have a urinary tract infection. I am prescribing macrobid antibiotic for the probable infection. Hydrate well. I am sending out a urine culture. During the interim if your signs and symptoms worsen rather than improving please notify us. We will notify your when the culture results are back.  Since you have some left cva pain decided to give rocephin im pending culture result.   Follow up in 7 days or as needed.  Mackie Pai, PA-C

## 2018-06-28 LAB — URINE CULTURE
MICRO NUMBER:: 91538630
SPECIMEN QUALITY:: ADEQUATE

## 2018-07-11 DIAGNOSIS — H10413 Chronic giant papillary conjunctivitis, bilateral: Secondary | ICD-10-CM | POA: Diagnosis not present

## 2018-07-18 DIAGNOSIS — D3131 Benign neoplasm of right choroid: Secondary | ICD-10-CM | POA: Diagnosis not present

## 2018-07-18 DIAGNOSIS — H353112 Nonexudative age-related macular degeneration, right eye, intermediate dry stage: Secondary | ICD-10-CM | POA: Diagnosis not present

## 2018-07-18 DIAGNOSIS — H353222 Exudative age-related macular degeneration, left eye, with inactive choroidal neovascularization: Secondary | ICD-10-CM | POA: Diagnosis not present

## 2018-07-18 DIAGNOSIS — H35421 Microcystoid degeneration of retina, right eye: Secondary | ICD-10-CM | POA: Diagnosis not present

## 2018-07-20 DIAGNOSIS — G5601 Carpal tunnel syndrome, right upper limb: Secondary | ICD-10-CM | POA: Diagnosis not present

## 2018-08-02 DIAGNOSIS — M79641 Pain in right hand: Secondary | ICD-10-CM | POA: Diagnosis not present

## 2018-08-03 ENCOUNTER — Other Ambulatory Visit: Payer: Self-pay | Admitting: Family Medicine

## 2018-08-03 ENCOUNTER — Telehealth: Payer: Self-pay | Admitting: *Deleted

## 2018-08-03 DIAGNOSIS — E785 Hyperlipidemia, unspecified: Principal | ICD-10-CM

## 2018-08-03 DIAGNOSIS — E1169 Type 2 diabetes mellitus with other specified complication: Secondary | ICD-10-CM

## 2018-08-03 NOTE — Telephone Encounter (Signed)
Thank You.

## 2018-08-03 NOTE — Telephone Encounter (Signed)
lowne placed orders

## 2018-08-03 NOTE — Telephone Encounter (Signed)
Pt is scheduled for lab appt on 08/10/18. I do not see any orders in Epic. Please place appropriate orders or advise if lab appt is not needed.

## 2018-08-10 ENCOUNTER — Other Ambulatory Visit (INDEPENDENT_AMBULATORY_CARE_PROVIDER_SITE_OTHER): Payer: Medicare HMO

## 2018-08-10 DIAGNOSIS — E1169 Type 2 diabetes mellitus with other specified complication: Secondary | ICD-10-CM | POA: Diagnosis not present

## 2018-08-10 DIAGNOSIS — E785 Hyperlipidemia, unspecified: Secondary | ICD-10-CM

## 2018-08-10 LAB — COMPREHENSIVE METABOLIC PANEL
ALT: 11 U/L (ref 0–35)
AST: 15 U/L (ref 0–37)
Albumin: 4.3 g/dL (ref 3.5–5.2)
Alkaline Phosphatase: 79 U/L (ref 39–117)
BUN: 14 mg/dL (ref 6–23)
CO2: 30 meq/L (ref 19–32)
Calcium: 8.9 mg/dL (ref 8.4–10.5)
Chloride: 104 mEq/L (ref 96–112)
Creatinine, Ser: 0.71 mg/dL (ref 0.40–1.20)
GFR: 79.49 mL/min (ref >60.00)
GLUCOSE: 81 mg/dL (ref 70–99)
Potassium: 4.5 mEq/L (ref 3.5–5.1)
SODIUM: 143 meq/L (ref 135–145)
Total Bilirubin: 0.4 mg/dL (ref 0.2–1.2)
Total Protein: 6.1 g/dL (ref 6.0–8.3)

## 2018-08-10 LAB — LIPID PANEL
Cholesterol: 194 mg/dL (ref 0–200)
HDL: 51.8 mg/dL (ref >39.00)
LDL Cholesterol: 119 mg/dL — ABNORMAL HIGH (ref 0–99)
NonHDL: 141.99
Total CHOL/HDL Ratio: 4
Triglycerides: 115 mg/dL (ref 0.0–149.0)
VLDL: 23 mg/dL (ref 0.0–40.0)

## 2018-08-16 DIAGNOSIS — M79641 Pain in right hand: Secondary | ICD-10-CM | POA: Diagnosis not present

## 2018-08-17 ENCOUNTER — Other Ambulatory Visit: Payer: Self-pay | Admitting: Family Medicine

## 2018-08-17 DIAGNOSIS — E1169 Type 2 diabetes mellitus with other specified complication: Secondary | ICD-10-CM

## 2018-08-17 DIAGNOSIS — E785 Hyperlipidemia, unspecified: Principal | ICD-10-CM

## 2018-08-17 MED ORDER — ATORVASTATIN CALCIUM 40 MG PO TABS: 40.0000 mg | ORAL_TABLET | Freq: Every day | ORAL | 2 refills | Status: DC

## 2018-09-24 ENCOUNTER — Other Ambulatory Visit: Payer: Self-pay | Admitting: Family Medicine

## 2018-09-24 DIAGNOSIS — G4700 Insomnia, unspecified: Secondary | ICD-10-CM

## 2018-09-24 NOTE — Telephone Encounter (Signed)
Requesting: Zolpidem Contract: Yes UDS: Yes, low risk Last OV: 06/26/2018 Next OV: 11/15/2018 Last Refill: 05/10/2018, #90--0 RF Database:   Please advise

## 2018-10-17 DIAGNOSIS — H353222 Exudative age-related macular degeneration, left eye, with inactive choroidal neovascularization: Secondary | ICD-10-CM | POA: Diagnosis not present

## 2018-11-05 ENCOUNTER — Other Ambulatory Visit: Payer: Self-pay | Admitting: Family Medicine

## 2018-11-15 ENCOUNTER — Other Ambulatory Visit: Payer: Self-pay

## 2018-11-15 ENCOUNTER — Other Ambulatory Visit (INDEPENDENT_AMBULATORY_CARE_PROVIDER_SITE_OTHER): Payer: Medicare HMO

## 2018-11-15 ENCOUNTER — Other Ambulatory Visit: Payer: Medicare HMO

## 2018-11-15 DIAGNOSIS — E1169 Type 2 diabetes mellitus with other specified complication: Secondary | ICD-10-CM | POA: Diagnosis not present

## 2018-11-15 DIAGNOSIS — E785 Hyperlipidemia, unspecified: Secondary | ICD-10-CM | POA: Diagnosis not present

## 2018-11-15 LAB — COMPREHENSIVE METABOLIC PANEL
ALT: 18 U/L (ref 0–35)
AST: 20 U/L (ref 0–37)
Albumin: 4.1 g/dL (ref 3.5–5.2)
Alkaline Phosphatase: 84 U/L (ref 39–117)
BUN: 21 mg/dL (ref 6–23)
CO2: 33 mEq/L — ABNORMAL HIGH (ref 19–32)
Calcium: 9 mg/dL (ref 8.4–10.5)
Chloride: 102 mEq/L (ref 96–112)
Creatinine, Ser: 0.71 mg/dL (ref 0.40–1.20)
GFR: 79.43 mL/min (ref >60.00)
Glucose, Bld: 90 mg/dL (ref 70–99)
Potassium: 4.2 mEq/L (ref 3.5–5.1)
Sodium: 142 mEq/L (ref 135–145)
Total Bilirubin: 0.5 mg/dL (ref 0.2–1.2)
Total Protein: 6 g/dL (ref 6.0–8.3)

## 2018-11-15 LAB — LIPID PANEL
Cholesterol: 168 mg/dL (ref 0–200)
HDL: 56.9 mg/dL (ref >39.00)
LDL Cholesterol: 94 mg/dL (ref 0–99)
NonHDL: 111.49
Total CHOL/HDL Ratio: 3
Triglycerides: 85 mg/dL (ref 0.0–149.0)
VLDL: 17 mg/dL (ref 0.0–40.0)

## 2018-12-21 ENCOUNTER — Other Ambulatory Visit: Payer: Self-pay | Admitting: Family Medicine

## 2018-12-22 ENCOUNTER — Other Ambulatory Visit: Payer: Self-pay | Admitting: Family Medicine

## 2018-12-22 DIAGNOSIS — M545 Low back pain, unspecified: Secondary | ICD-10-CM

## 2019-01-16 DIAGNOSIS — D3131 Benign neoplasm of right choroid: Secondary | ICD-10-CM | POA: Diagnosis not present

## 2019-01-16 DIAGNOSIS — H353223 Exudative age-related macular degeneration, left eye, with inactive scar: Secondary | ICD-10-CM | POA: Diagnosis not present

## 2019-01-16 DIAGNOSIS — H353112 Nonexudative age-related macular degeneration, right eye, intermediate dry stage: Secondary | ICD-10-CM | POA: Diagnosis not present

## 2019-01-16 DIAGNOSIS — H35421 Microcystoid degeneration of retina, right eye: Secondary | ICD-10-CM | POA: Diagnosis not present

## 2019-01-24 ENCOUNTER — Other Ambulatory Visit: Payer: Self-pay

## 2019-01-24 ENCOUNTER — Ambulatory Visit (INDEPENDENT_AMBULATORY_CARE_PROVIDER_SITE_OTHER): Payer: Medicare HMO | Admitting: Family Medicine

## 2019-01-24 ENCOUNTER — Encounter: Payer: Self-pay | Admitting: Family Medicine

## 2019-01-24 VITALS — BP 141/64 | HR 65 | Temp 97.4°F | Resp 18 | Ht 64.0 in | Wt 137.8 lb

## 2019-01-24 DIAGNOSIS — R1903 Right lower quadrant abdominal swelling, mass and lump: Secondary | ICD-10-CM | POA: Insufficient documentation

## 2019-01-24 DIAGNOSIS — G4700 Insomnia, unspecified: Secondary | ICD-10-CM | POA: Diagnosis not present

## 2019-01-24 DIAGNOSIS — K219 Gastro-esophageal reflux disease without esophagitis: Secondary | ICD-10-CM

## 2019-01-24 LAB — BASIC METABOLIC PANEL
BUN: 20 mg/dL (ref 6–23)
CO2: 32 mEq/L (ref 19–32)
Calcium: 9.4 mg/dL (ref 8.4–10.5)
Chloride: 101 mEq/L (ref 96–112)
Creatinine, Ser: 0.73 mg/dL (ref 0.40–1.20)
GFR: 76.89 mL/min (ref >60.00)
Glucose, Bld: 79 mg/dL (ref 70–99)
Potassium: 4.3 mEq/L (ref 3.5–5.1)
Sodium: 139 mEq/L (ref 135–145)

## 2019-01-24 MED ORDER — OMEPRAZOLE 20 MG PO CPDR: 20.0000 mg | DELAYED_RELEASE_CAPSULE | Freq: Every day | ORAL | 3 refills | Status: DC

## 2019-01-24 MED ORDER — ZOLPIDEM TARTRATE 5 MG PO TABS: ORAL_TABLET | ORAL | 0 refills | Status: DC

## 2019-01-24 NOTE — Progress Notes (Signed)
-Patient ID: Joanna Williams, female    DOB: [birth year]  Age: 78 y.o. MRN: [id number]    Subjective:  Subjective  HPI [name] presents for lump in her R low abd---  Causing mild tenderness but no severe.  No NVD, no fever.  She noticed It when she stepped out of the shower the other day  Review of Systems  Constitutional: Negative for appetite change, diaphoresis, fatigue and unexpected weight change.  Eyes: Negative for pain, redness and visual disturbance.  Respiratory: Negative for cough, chest tightness, shortness of breath and wheezing.   Cardiovascular: Negative for chest pain, palpitations and leg swelling.  Gastrointestinal: Positive for abdominal pain.  Endocrine: Negative for cold intolerance, heat intolerance, polydipsia, polyphagia and polyuria.  Genitourinary: Negative for difficulty urinating, dysuria and frequency.  Neurological: Negative for dizziness, light-headedness, numbness and headaches.    History Past Medical History:  Diagnosis Date  . GERD (gastroesophageal reflux disease)   . Hyperlipidemia   . Macular degeneration 05/04/2017    She has a past surgical history that includes Colonoscopy (2 yrs ago); Total hip arthroplasty (08/10/2011); and Eye surgery (Bilateral, 2014).   Her family history includes Cancer (age of onset: 77) in her father; Heart disease in her mother.She reports that she has never smoked. She has never used smokeless tobacco. She reports current alcohol use of about 1.0 standard drinks of alcohol per week. She reports current drug use. Drug: Hydrocodone.  Current Outpatient Medications on File Prior to Visit  Medication Sig Dispense Refill  . atorvastatin (LIPITOR) 40 MG tablet TAKE 1 TABLET BY MOUTH EVERY DAY 60 tablet 0  . denosumab (PROLIA) 60 MG/ML SOLN injection Inject 60 mg into the skin every 6 (six) months. Administer in upper arm, thigh, or abdomen    . gabapentin (NEURONTIN) 100 MG capsule Take 1 capsule (100 mg total)  by mouth at bedtime. 90 capsule 0  . Multiple Vitamins-Minerals (PRESERVISION AREDS 2+MULTI VIT PO) Take 2 tablets by mouth daily.    Marland Kitchen neomycin-polymyxin b-dexamethasone (MAXITROL) 3.5-10000-0.1 OINT     . nitrofurantoin, macrocrystal-monohydrate, (MACROBID) 100 MG capsule Take 1 capsule (100 mg total) by mouth 2 (two) times daily. 14 capsule 0   No current facility-administered medications on file prior to visit.      Objective:  Objective  Physical Exam Vitals signs and nursing note reviewed.  Constitutional:      Appearance: She is well-developed.  HENT:     Head: Normocephalic and atraumatic.  Eyes:     Conjunctiva/sclera: Conjunctivae normal.  Neck:     Musculoskeletal: Normal range of motion and neck supple.     Thyroid: No thyromegaly.     Vascular: No carotid bruit or JVD.  Cardiovascular:     Rate and Rhythm: Normal rate and regular rhythm.     Heart sounds: Normal heart sounds. No murmur.  Pulmonary:     Effort: Pulmonary effort is normal. No respiratory distress.     Breath sounds: Normal breath sounds. No wheezing or rales.  Chest:     Chest wall: No tenderness.  Abdominal:     General: There is no distension.     Palpations: There is mass.     Tenderness: There is no abdominal tenderness. There is no guarding or rebound.     Hernia: A hernia is present.    Neurological:     Mental Status: She is alert and oriented to person, place, and time.    BP (!) 141/64 (BP Location:  Left Arm, Patient Position: Sitting, Cuff Size: Normal)   Pulse 65   Temp (!) 97.4 F (36.3 C) (Oral)   Resp 18   Ht 5\' 4"  (1.626 m)   Wt 137 lb 12.8 oz (62.5 kg)   SpO2 100%   BMI 23.65 kg/m  Wt Readings from Last 3 Encounters:  01/24/19 137 lb 12.8 oz (62.5 kg)  06/26/18 144 lb (65.3 kg)  05/10/18 144 lb (65.3 kg)     Lab Results  Component Value Date   WBC 6.3 05/04/2017   HGB 14.2 05/04/2017   HCT 42.6 05/04/2017   PLT 241.0 05/04/2017   GLUCOSE 90 11/15/2018   CHOL  168 11/15/2018   TRIG 85.0 11/15/2018   HDL 56.90 11/15/2018   LDLDIRECT 208.0 09/17/2015   LDLCALC 94 11/15/2018   ALT 18 11/15/2018   AST 20 11/15/2018   NA 142 11/15/2018   K 4.2 11/15/2018   CL 102 11/15/2018   CREATININE 0.71 11/15/2018   BUN 21 11/15/2018   CO2 33 (H) 11/15/2018   TSH 2.34 06/15/2010   INR 1.50 (H) 08/13/2011   HGBA1C 5.8 08/13/2012   MICROALBUR <0.7 03/19/2015    Mm 3d Screen Breast Bilateral  Result Date: 04/18/2018 CLINICAL DATA:  Screening. EXAM: DIGITAL SCREENING BILATERAL MAMMOGRAM WITH TOMO AND CAD COMPARISON:  Previous exam(s). ACR Breast Density Category b: There are scattered areas of fibroglandular density. FINDINGS: There are no findings suspicious for malignancy. Images were processed with CAD. IMPRESSION: No mammographic evidence of malignancy. A result letter of this screening mammogram will be mailed directly to the patient. RECOMMENDATION: Screening mammogram in one year. (Code:SM-B-01Y) BI-RADS CATEGORY  1: Negative. Electronically Signed   By: Lajean Manes M.D.   On: 04/18/2018 12:27     Assessment & Plan:  Plan  I have changed [name] "[name]"'s zolpidem. I am also having her maintain her denosumab, Multiple Vitamins-Minerals (PRESERVISION AREDS 2+MULTI VIT PO), neomycin-polymyxin b-dexamethasone, nitrofurantoin (macrocrystal-monohydrate), atorvastatin, gabapentin, and omeprazole.  Meds ordered this encounter  Medications  . zolpidem (AMBIEN) 5 MG tablet    Sig: 1 po qpm    Dispense:  90 tablet    Refill:  0    Not to exceed 5 additional fills before 11/06/2018  . omeprazole (PRILOSEC) 20 MG capsule    Sig: Take 1 capsule (20 mg total) by mouth daily.    Dispense:  90 capsule    Refill:  3    Problem List Items Addressed This Visit      Unprioritized   GERD (gastroesophageal reflux disease)   Relevant Medications   omeprazole (PRILOSEC) 20 MG capsule   Right lower quadrant abdominal mass - Primary    ? Hernia Check  ct  Consider surgery referral  If pain worsens -- go to ER      Relevant Orders   CT Abdomen Pelvis W Contrast   Basic metabolic panel    Other Visit Diagnoses    Insomnia, unspecified type       Relevant Medications   zolpidem (AMBIEN) 5 MG tablet      Follow-up: Return if symptoms worsen or fail to improve.  Ann Held, DO

## 2019-01-24 NOTE — Patient Instructions (Signed)
Hernia, Adult     A hernia is the bulging of an organ or tissue through a weak spot in the muscles of the abdomen (abdominal wall). Hernias develop most often near the belly button (navel) or the area where the leg meets the lower abdomen (groin). Common types of hernias include:  Incisional hernia. This type bulges through a scar from an abdominal surgery.  Umbilical hernia. This type develops near the navel.  Inguinal hernia. This type develops in the groin or scrotum.  Femoral hernia. This type develops under the groin, in the upper thigh area.  Hiatal hernia. This type occurs when part of the stomach slides above the muscle that separates the abdomen from the chest (diaphragm). What are the causes? This condition may be caused by:  Heavy lifting.  Coughing over a long period of time.  Straining to have a bowel movement. Constipation can lead to straining.  An incision made during an abdominal surgery.  A physical problem that is present at birth (congenital defect).  Being overweight or obese.  Smoking.  Excess fluid in the abdomen.  Undescended testicles in males. What are the signs or symptoms? The main symptom is a skin-colored, rounded bulge in the area of the hernia. However, a bulge may not always be present. It may grow bigger or be more visible when you cough or strain (such as when lifting something heavy). A hernia that can be pushed back into the area (is reducible) rarely causes pain. A hernia that cannot be pushed back into the area (is incarcerated) may lose its blood supply (become strangulated). A hernia that is incarcerated may cause:  Pain.  Fever.  Nausea and vomiting.  Swelling.  Constipation. How is this diagnosed? A hernia may be diagnosed based on:  Your symptoms and medical history.  A physical exam. Your health care provider may ask you to cough or move in certain ways to see if the hernia becomes visible.  Imaging tests, such as:  ? X-rays. ? Ultrasound. ? CT scan. How is this treated? A hernia that is small and painless may not need to be treated. A hernia that is large or painful may be treated with surgery. Inguinal hernias may be treated with surgery to prevent incarceration or strangulation. Strangulated hernias are always treated with surgery because a lack of blood supply to the trapped organ or tissue can cause it to die. Surgery to treat a hernia involves pushing the bulge back into place and repairing the weak area of the muscle or abdominal wall. Follow these instructions at home: Activity  Avoid straining.  Do not lift anything that is heavier than 10 lb (4.5 kg), or the limit that you are told, until your health care provider says that it is safe.  When lifting heavy objects, lift with your leg muscles, not your back muscles. Preventing constipation  Take actions to prevent constipation. Constipation leads to straining with bowel movements, which can make a hernia worse or cause a hernia repair to break down. Your health care provider may recommend that you: ? Drink enough fluid to keep your urine pale yellow. ? Eat foods that are high in fiber, such as fresh fruits and vegetables, whole grains, and beans. ? Limit foods that are high in fat and processed sugars, such as fried or sweet foods. ? Take an over-the-counter or prescription medicine for constipation. General instructions  When coughing, try to cough gently.  You may try to push the hernia back in place  by very gently pressing on it while lying down. Do not try to force the bulge back in if it will not push in easily.  If you are overweight, work with your health care provider to lose weight safely.  Do not use any products that contain nicotine or tobacco, such as cigarettes and e-cigarettes. If you need help quitting, ask your health care provider.  If you are scheduled for hernia repair, watch your hernia for any changes in shape, size,  or color. Tell your health care provider about any changes or new symptoms.  Take over-the-counter and prescription medicines only as told by your health care provider.  Keep all follow-up visits as told by your health care provider. This is important. Contact a health care provider if:  You develop new pain, swelling, or redness around your hernia.  You have signs of constipation, such as: ? Fewer bowel movements in a week than normal. ? Difficulty having a bowel movement. ? Stools that are dry, hard, or larger than normal. Get help right away if:  You have a fever.  You have abdomen pain that gets worse.  You feel nauseous or you vomit.  You cannot push the hernia back in place by very gently pressing on it while lying down. Do not try to force the bulge back in if it will not push in easily.  The hernia: ? Changes in shape, size, or color. ? Feels hard or tender. These symptoms may represent a serious problem that is an emergency. Do not wait to see if the symptoms will go away. Get medical help right away. Call your local emergency services (911 in the [place] Summary  A hernia is the bulging of an organ or tissue through a weak spot in the muscles of the abdomen (abdominal wall).  The main symptom is a skin-colored, rounded lump (bulge) in the hernia area. However, a bulge may not always be present. It may grow bigger or more visible when you cough or strain (such as when having a bowel movement).  A hernia that is small and painless may not need to be treated. A hernia that is large or painful may be treated with surgery.  Surgery to treat a hernia involves pushing the bulge back into place and repairing the weak part of the abdomen. This information is not intended to replace advice given to you by your health care provider. Make sure you discuss any questions you have with your health care provider. Document Released: 06/20/2005 Document Revised: 10/11/2018 Document  Reviewed: 03/22/2017 Elsevier Patient Education  2020 [employer].

## 2019-01-24 NOTE — Assessment & Plan Note (Signed)
?   Hernia Check ct  Consider surgery referral  If pain worsens -- go to ER

## 2019-01-25 ENCOUNTER — Other Ambulatory Visit: Payer: Self-pay

## 2019-01-25 ENCOUNTER — Encounter (HOSPITAL_BASED_OUTPATIENT_CLINIC_OR_DEPARTMENT_OTHER): Payer: Self-pay

## 2019-01-25 ENCOUNTER — Ambulatory Visit (HOSPITAL_BASED_OUTPATIENT_CLINIC_OR_DEPARTMENT_OTHER)
Admission: RE | Admit: 2019-01-25 | Discharge: 2019-01-25 | Disposition: A | Discharge status: Home / Self Care | Payer: Medicare HMO | Source: Ambulatory Visit | Attending: Family Medicine | Admitting: Family Medicine

## 2019-01-25 ENCOUNTER — Ambulatory Visit (HOSPITAL_BASED_OUTPATIENT_CLINIC_OR_DEPARTMENT_OTHER): Payer: Medicare HMO

## 2019-01-25 ENCOUNTER — Other Ambulatory Visit: Payer: Self-pay | Admitting: Family Medicine

## 2019-01-25 DIAGNOSIS — R1031 Right lower quadrant pain: Secondary | ICD-10-CM | POA: Diagnosis not present

## 2019-01-25 DIAGNOSIS — R1903 Right lower quadrant abdominal swelling, mass and lump: Secondary | ICD-10-CM | POA: Diagnosis not present

## 2019-01-25 DIAGNOSIS — K4090 Unilateral inguinal hernia, without obstruction or gangrene, not specified as recurrent: Secondary | ICD-10-CM

## 2019-01-25 MED ORDER — IOHEXOL 300 MG/ML  SOLN
100.0000 mL | Freq: Once | INTRAMUSCULAR | Status: AC | PRN
  Administered 2019-01-25: 10:00:00 100 mL via INTRAVENOUS

## 2019-01-29 ENCOUNTER — Telehealth: Payer: Self-pay | Admitting: Family Medicine

## 2019-01-29 NOTE — Telephone Encounter (Signed)
Patient calling for results of CT scan from 7/24.

## 2019-01-30 NOTE — Telephone Encounter (Signed)
Pt called

## 2019-02-14 ENCOUNTER — Telehealth: Payer: Self-pay | Admitting: *Deleted

## 2019-02-14 NOTE — Telephone Encounter (Signed)
Spoke with pt's spouse as pt was not at home at the time of call. Advised him that no specific doctor was listed on the referral and pt may pick whichever provider she would like. Spouse asked about schedule availability and I advised him that is determined by the surgical office. He voices understanding and states he will relay information to the pt.  Copied from [place] [id number]. Topic: Referral - Question >> Feb 14, 2019  9:30 AM Sheran Luz wrote: Patient requesting call back from referral coordinator regarding referral to [facility]. She wanted to know which surgeon was recommended before she schedules.

## 2019-02-18 ENCOUNTER — Other Ambulatory Visit: Payer: Self-pay | Admitting: Family Medicine

## 2019-02-25 DIAGNOSIS — K469 Unspecified abdominal hernia without obstruction or gangrene: Secondary | ICD-10-CM | POA: Diagnosis not present

## 2019-02-28 DIAGNOSIS — K4090 Unilateral inguinal hernia, without obstruction or gangrene, not specified as recurrent: Secondary | ICD-10-CM | POA: Diagnosis not present

## 2019-03-15 DIAGNOSIS — Z1159 Encounter for screening for other viral diseases: Secondary | ICD-10-CM | POA: Diagnosis not present

## 2019-03-17 ENCOUNTER — Other Ambulatory Visit: Payer: Self-pay | Admitting: Family Medicine

## 2019-03-17 DIAGNOSIS — M545 Low back pain, unspecified: Secondary | ICD-10-CM

## 2019-03-19 DIAGNOSIS — K4090 Unilateral inguinal hernia, without obstruction or gangrene, not specified as recurrent: Secondary | ICD-10-CM | POA: Diagnosis not present

## 2019-03-19 DIAGNOSIS — K4130 Unilateral femoral hernia, with obstruction, without gangrene, not specified as recurrent: Secondary | ICD-10-CM | POA: Diagnosis not present

## 2019-03-28 ENCOUNTER — Ambulatory Visit (INDEPENDENT_AMBULATORY_CARE_PROVIDER_SITE_OTHER): Payer: Medicare HMO | Admitting: Internal Medicine

## 2019-03-28 ENCOUNTER — Encounter: Payer: Self-pay | Admitting: Internal Medicine

## 2019-03-28 ENCOUNTER — Other Ambulatory Visit: Payer: Self-pay

## 2019-03-28 VITALS — BP 131/60 | HR 70 | Temp 96.6°F | Resp 16 | Ht 64.0 in | Wt 132.5 lb

## 2019-03-28 DIAGNOSIS — R21 Rash and other nonspecific skin eruption: Secondary | ICD-10-CM | POA: Diagnosis not present

## 2019-03-28 MED ORDER — BETAMETHASONE DIPROPIONATE AUG 0.05 % EX CREA: TOPICAL_CREAM | Freq: Two times a day (BID) | CUTANEOUS | 0 refills | Status: DC

## 2019-03-28 MED ORDER — TETANUS-DIPHTH-ACELL PERTUSSIS 5-2.5-18.5 LF-MCG/0.5 IM SUSP: 0.5000 mL | Freq: Once | INTRAMUSCULAR | 0 refills | Status: AC

## 2019-03-28 NOTE — Patient Instructions (Signed)
Apply the cream 2 or 3 times a day  If the itching continue, take claritin or similar medication  Call if no gradually better  Call if worse  Get a TDAP at your convenience

## 2019-03-28 NOTE — Progress Notes (Signed)
Subjective:    Patient ID: Joanna Williams, female    DOB: [birth year], 78 y.o.   MRN: [id number]  DOS:  03/28/2019 Type of visit - description: Acute 10 days ago developed a rash at the left ankle, is extremely pruritic more than painful. She thinks is probably an insect bite but does not recall any bites per se. I ask about exposure to poison ivy or oak: She denies.    Review of Systems No fever chills No other rash  Past Medical History:  Diagnosis Date  . GERD (gastroesophageal reflux disease)   . Hyperlipidemia   . Macular degeneration 05/04/2017    Past Surgical History:  Procedure Laterality Date  . COLONOSCOPY  2 yrs ago  . EYE SURGERY Bilateral 2014   cataract  . TOTAL HIP ARTHROPLASTY  08/10/2011   Procedure: TOTAL HIP ARTHROPLASTY;  Surgeon: Kerin Salen, MD;  Location: [facility];  Service: Orthopedics;  Laterality: Right;  DEPUY/ PENNACLE POLY OR CERAMIC    Social History   Socioeconomic History  . Marital status: Married    Spouse name: Not on file  . Number of children: Not on file  . Years of education: Not on file  . Highest education level: Not on file  Occupational History  . Occupation: retired    Fish farm manager: RETIRED  Social Needs  . Financial resource strain: Not hard at all  . Food insecurity    Worry: Never true    Inability: Never true  . Transportation needs    Medical: No    Non-medical: No  Tobacco Use  . Smoking status: Never Smoker  . Smokeless tobacco: Never Used  Substance and Sexual Activity  . Alcohol use: Yes    Alcohol/week: 1.0 standard drinks    Types: 1 Glasses of wine per week  . Drug use: Yes    Types: Hydrocodone  . Sexual activity: Yes    Partners: Male  Lifestyle  . Physical activity    Days per week: 3 days    Minutes per session: 120 min  . Stress: Only a little  Relationships  . Social Herbalist on phone: Three times a week    Gets together: Twice a week    Attends religious service: Not on file     Active member of club or organization: Yes    Attends meetings of clubs or organizations: More than 4 times per year    Relationship status: Married  . Intimate partner violence    Fear of current or ex partner: No    Emotionally abused: No    Physically abused: No    Forced sexual activity: No  Other Topics Concern  . Not on file  Social History Narrative   Walking, yoga, pickle ball      Allergies as of 03/28/2019      Reactions   Niacin    REACTION: Nausea   Sulfa Antibiotics       Medication List       Accurate as of March 28, 2019  1:46 PM. If you have any questions, ask your nurse or doctor.        atorvastatin 40 MG tablet Commonly known as: LIPITOR TAKE 1 TABLET BY MOUTH EVERY DAY   denosumab 60 MG/ML Soln injection Commonly known as: PROLIA Inject 60 mg into the skin every 6 (six) months. Administer in upper arm, thigh, or abdomen   gabapentin 100 MG capsule Commonly known as: NEURONTIN TAKE 1  CAPSULE (100 MG TOTAL) BY MOUTH AT BEDTIME.   neomycin-polymyxin b-dexamethasone 3.5-10000-0.1 Oint Commonly known as: MAXITROL   nitrofurantoin (macrocrystal-monohydrate) 100 MG capsule Commonly known as: Macrobid Take 1 capsule (100 mg total) by mouth 2 (two) times daily.   omeprazole 20 MG capsule Commonly known as: PRILOSEC Take 1 capsule (20 mg total) by mouth daily.   PRESERVISION AREDS 2+MULTI VIT PO Take 2 tablets by mouth daily.   zolpidem 5 MG tablet Commonly known as: AMBIEN 1 po qpm           Objective:   Physical Exam BP 131/60 (BP Location: Left Arm, Patient Position: Sitting, Cuff Size: Small)   Pulse 70   Temp (!) 96.6 F (35.9 C) (Temporal)   Resp 16   Ht 5\' 4"  (1.626 m)   Wt 132 lb 8 oz (60.1 kg)   SpO2 97%   BMI 22.74 kg/m  General:   Well developed, NAD, BMI noted. HEENT:  Normocephalic . Face symmetric, atraumatic Skin: See picture neurologic:  alert & oriented X3.  Speech normal, gait appropriate for age and  unassisted Psych--  Cognition and judgment appear intact.  Cooperative with normal attention span and concentration.  Behavior appropriate. No anxious or depressed appearing.        Assessment     78 year old female, PMH includes thyroid disease, GERD, high cholesterol, osteopenia, presents with  Rash: Pruritic rash, see picture, looks like contact dermatitis more so than a insect bite.  No evidence of cellulitis on clinical grounds. Plan: Treat empirically with topical steroids, see prescription Claritin or Benadryl as needed Call if no better. Preventive care: Due for Tdap, prescription provided.

## 2019-03-28 NOTE — Progress Notes (Signed)
Pre visit review using our clinic review tool, if applicable. No additional management support is needed unless otherwise documented below in the visit note. 

## 2019-04-17 ENCOUNTER — Other Ambulatory Visit: Payer: Self-pay | Admitting: *Deleted

## 2019-04-17 DIAGNOSIS — H353112 Nonexudative age-related macular degeneration, right eye, intermediate dry stage: Secondary | ICD-10-CM | POA: Diagnosis not present

## 2019-04-17 DIAGNOSIS — G4700 Insomnia, unspecified: Secondary | ICD-10-CM

## 2019-04-17 DIAGNOSIS — K219 Gastro-esophageal reflux disease without esophagitis: Secondary | ICD-10-CM

## 2019-04-17 DIAGNOSIS — H353223 Exudative age-related macular degeneration, left eye, with inactive scar: Secondary | ICD-10-CM | POA: Diagnosis not present

## 2019-04-17 DIAGNOSIS — M545 Low back pain, unspecified: Secondary | ICD-10-CM

## 2019-04-17 MED ORDER — ZOLPIDEM TARTRATE 5 MG PO TABS: ORAL_TABLET | ORAL | 0 refills | Status: DC

## 2019-04-17 MED ORDER — GABAPENTIN 100 MG PO CAPS: 100.0000 mg | ORAL_CAPSULE | Freq: Every day | ORAL | 1 refills | Status: DC

## 2019-04-17 MED ORDER — ATORVASTATIN CALCIUM 40 MG PO TABS: 40.0000 mg | ORAL_TABLET | Freq: Every day | ORAL | 1 refills | Status: DC

## 2019-04-17 MED ORDER — OMEPRAZOLE 20 MG PO CPDR: 20.0000 mg | DELAYED_RELEASE_CAPSULE | Freq: Every day | ORAL | 3 refills | Status: DC

## 2019-04-17 NOTE — Telephone Encounter (Signed)
[facility] requesting refill for zolpidem.  Last written: 01/24/19 Last ov: 01/24/19 Next ov: nothing scheduled Contract: due UDS: due

## 2019-05-09 ENCOUNTER — Ambulatory Visit (INDEPENDENT_AMBULATORY_CARE_PROVIDER_SITE_OTHER): Payer: Medicare HMO | Admitting: *Deleted

## 2019-05-09 ENCOUNTER — Other Ambulatory Visit: Payer: Self-pay

## 2019-05-09 DIAGNOSIS — M810 Age-related osteoporosis without current pathological fracture: Secondary | ICD-10-CM

## 2019-05-09 MED ORDER — DENOSUMAB 60 MG/ML ~~LOC~~ SOSY
60.0000 mg | PREFILLED_SYRINGE | Freq: Once | SUBCUTANEOUS | Status: AC
  Administered 2019-05-09: 60 mg via SUBCUTANEOUS

## 2019-05-09 NOTE — Progress Notes (Signed)
Patient her for prolia injection  Injection given and patient tolerated well.

## 2019-05-14 ENCOUNTER — Other Ambulatory Visit (HOSPITAL_BASED_OUTPATIENT_CLINIC_OR_DEPARTMENT_OTHER): Payer: Self-pay | Admitting: Family Medicine

## 2019-05-14 DIAGNOSIS — Z1231 Encounter for screening mammogram for malignant neoplasm of breast: Secondary | ICD-10-CM

## 2019-05-28 ENCOUNTER — Encounter (HOSPITAL_BASED_OUTPATIENT_CLINIC_OR_DEPARTMENT_OTHER): Payer: Self-pay

## 2019-05-28 ENCOUNTER — Other Ambulatory Visit: Payer: Self-pay

## 2019-05-28 ENCOUNTER — Ambulatory Visit (HOSPITAL_BASED_OUTPATIENT_CLINIC_OR_DEPARTMENT_OTHER)
Admission: RE | Admit: 2019-05-28 | Discharge: 2019-05-28 | Disposition: A | Discharge status: Home / Self Care | Payer: Medicare HMO | Source: Ambulatory Visit | Attending: Family Medicine | Admitting: Family Medicine

## 2019-05-28 DIAGNOSIS — Z1231 Encounter for screening mammogram for malignant neoplasm of breast: Secondary | ICD-10-CM | POA: Diagnosis not present

## 2019-06-03 ENCOUNTER — Other Ambulatory Visit: Payer: Self-pay

## 2019-06-04 ENCOUNTER — Other Ambulatory Visit: Payer: Medicare HMO

## 2019-06-04 ENCOUNTER — Ambulatory Visit (INDEPENDENT_AMBULATORY_CARE_PROVIDER_SITE_OTHER): Payer: Medicare HMO | Admitting: Family Medicine

## 2019-06-04 ENCOUNTER — Other Ambulatory Visit: Payer: Self-pay

## 2019-06-04 ENCOUNTER — Encounter: Payer: Self-pay | Admitting: Family Medicine

## 2019-06-04 VITALS — BP 120/70 | HR 68 | Temp 97.6°F | Resp 18 | Ht 64.0 in | Wt 133.2 lb

## 2019-06-04 DIAGNOSIS — R011 Cardiac murmur, unspecified: Secondary | ICD-10-CM

## 2019-06-04 DIAGNOSIS — K148 Other diseases of tongue: Secondary | ICD-10-CM

## 2019-06-04 DIAGNOSIS — K219 Gastro-esophageal reflux disease without esophagitis: Secondary | ICD-10-CM

## 2019-06-04 DIAGNOSIS — E039 Hypothyroidism, unspecified: Secondary | ICD-10-CM

## 2019-06-04 DIAGNOSIS — G4700 Insomnia, unspecified: Secondary | ICD-10-CM | POA: Diagnosis not present

## 2019-06-04 DIAGNOSIS — M545 Low back pain, unspecified: Secondary | ICD-10-CM | POA: Insufficient documentation

## 2019-06-04 DIAGNOSIS — E785 Hyperlipidemia, unspecified: Secondary | ICD-10-CM | POA: Diagnosis not present

## 2019-06-04 DIAGNOSIS — M810 Age-related osteoporosis without current pathological fracture: Secondary | ICD-10-CM

## 2019-06-04 LAB — COMPREHENSIVE METABOLIC PANEL
ALT: 20 U/L (ref 0–35)
AST: 22 U/L (ref 0–37)
Albumin: 4.3 g/dL (ref 3.5–5.2)
Alkaline Phosphatase: 97 U/L (ref 39–117)
BUN: 18 mg/dL (ref 6–23)
CO2: 29 mEq/L (ref 19–32)
Calcium: 8.4 mg/dL (ref 8.4–10.5)
Chloride: 104 mEq/L (ref 96–112)
Creatinine, Ser: 0.56 mg/dL (ref 0.40–1.20)
GFR: 104.31 mL/min (ref >60.00)
Glucose, Bld: 90 mg/dL (ref 70–99)
Potassium: 4.4 mEq/L (ref 3.5–5.1)
Sodium: 141 mEq/L (ref 135–145)
Total Bilirubin: 0.5 mg/dL (ref 0.2–1.2)
Total Protein: 6.2 g/dL (ref 6.0–8.3)

## 2019-06-04 LAB — LIPID PANEL
Cholesterol: 175 mg/dL (ref 0–200)
HDL: 53 mg/dL (ref >39.00)
LDL Cholesterol: 104 mg/dL — ABNORMAL HIGH (ref 0–99)
NonHDL: 122.24
Total CHOL/HDL Ratio: 3
Triglycerides: 93 mg/dL (ref 0.0–149.0)
VLDL: 18.6 mg/dL (ref 0.0–40.0)

## 2019-06-04 MED ORDER — GABAPENTIN 100 MG PO CAPS: 100.0000 mg | ORAL_CAPSULE | Freq: Every day | ORAL | 1 refills | Status: DC

## 2019-06-04 MED ORDER — OMEPRAZOLE 20 MG PO CPDR: 20.0000 mg | DELAYED_RELEASE_CAPSULE | Freq: Every day | ORAL | 3 refills | Status: DC

## 2019-06-04 MED ORDER — ATORVASTATIN CALCIUM 40 MG PO TABS: 40.0000 mg | ORAL_TABLET | Freq: Every day | ORAL | 1 refills | Status: DC

## 2019-06-04 MED ORDER — ZOLPIDEM TARTRATE 5 MG PO TABS: ORAL_TABLET | ORAL | 0 refills | Status: DC

## 2019-06-04 NOTE — Assessment & Plan Note (Signed)
Lesion has enlarged

## 2019-06-04 NOTE — Assessment & Plan Note (Signed)
Encouraged heart healthy diet, increase exercise, avoid trans fats, consider a krill oil cap daily 

## 2019-06-04 NOTE — Assessment & Plan Note (Signed)
Check labs 

## 2019-06-04 NOTE — Progress Notes (Signed)
Patient ID: [name], female    DOB: 1941-05-25  Age: 78 y.o. MRN: [id number]    Subjective:  Subjective  HPI Joanna Williams presents for f/u cholesterol and insomnia    No new complaints   Review of Systems  Constitutional: Negative for appetite change, diaphoresis, fatigue and unexpected weight change.  Eyes: Negative for pain, redness and visual disturbance.  Respiratory: Negative for cough, chest tightness, shortness of breath and wheezing.   Cardiovascular: Negative for chest pain, palpitations and leg swelling.  Endocrine: Negative for cold intolerance, heat intolerance, polydipsia, polyphagia and polyuria.  Genitourinary: Negative for difficulty urinating, dysuria and frequency.  Neurological: Negative for dizziness, light-headedness, numbness and headaches.    History Past Medical History:  Diagnosis Date  . GERD (gastroesophageal reflux disease)   . Hyperlipidemia   . Macular degeneration 05/04/2017    She has a past surgical history that includes Colonoscopy (2 yrs ago); Total hip arthroplasty (08/10/2011); and Eye surgery (Bilateral, 2014).   Her family history includes Cancer (age of onset: 62) in her father; Heart disease in her mother.She reports that she has never smoked. She has never used smokeless tobacco. She reports current alcohol use of about 1.0 standard drinks of alcohol per week. She reports current drug use. Drug: Hydrocodone.  Current Outpatient Medications on File Prior to Visit  Medication Sig Dispense Refill  . augmented betamethasone dipropionate (DIPROLENE-AF) 0.05 % cream Apply topically 2 (two) times daily. 60 g 0  . denosumab (PROLIA) 60 MG/ML SOLN injection Inject 60 mg into the skin every 6 (six) months. Administer in upper arm, thigh, or abdomen    . Multiple Vitamins-Minerals (PRESERVISION AREDS 2+MULTI VIT PO) Take 2 tablets by mouth daily.    Marland Kitchen neomycin-polymyxin b-dexamethasone (MAXITROL) 3.5-10000-0.1 OINT      No current  facility-administered medications on file prior to visit.      Objective:  Objective  Physical Exam Vitals signs and nursing note reviewed.  Constitutional:      General: She is not in acute distress.    Appearance: She is well-developed.  HENT:     Right Ear: External ear normal.     Left Ear: External ear normal.     Nose: Nose normal.     Mouth/Throat:   Eyes:     Pupils: Pupils are equal, round, and reactive to light.  Neck:     Musculoskeletal: Normal range of motion and neck supple.  Cardiovascular:     Rate and Rhythm: Normal rate and regular rhythm.     Heart sounds: Murmur present.  Pulmonary:     Effort: Pulmonary effort is normal. No respiratory distress.     Breath sounds: Normal breath sounds. No wheezing or rales.  Chest:     Chest wall: No tenderness.  Neurological:     Mental Status: She is alert and oriented to person, place, and time.  Psychiatric:        Behavior: Behavior normal.        Thought Content: Thought content normal.        Judgment: Judgment normal.    BP 120/70 (BP Location: Right Arm, Patient Position: Sitting, Cuff Size: Normal)   Pulse 68   Temp 97.6 F (36.4 C) (Temporal)   Resp 18   Ht 5\' 4"  (1.626 m)   Wt 133 lb 3.2 oz (60.4 kg)   SpO2 97%   BMI 22.86 kg/m  Wt Readings from Last 3 Encounters:  06/04/19 133 lb 3.2 oz (60.4  kg)  03/28/19 132 lb 8 oz (60.1 kg)  01/24/19 137 lb 12.8 oz (62.5 kg)     Lab Results  Component Value Date   WBC 6.3 05/04/2017   HGB 14.2 05/04/2017   HCT 42.6 05/04/2017   PLT 241.0 05/04/2017   GLUCOSE 79 01/24/2019   CHOL 168 11/15/2018   TRIG 85.0 11/15/2018   HDL 56.90 11/15/2018   LDLDIRECT 208.0 09/17/2015   LDLCALC 94 11/15/2018   ALT 18 11/15/2018   AST 20 11/15/2018   NA 139 01/24/2019   K 4.3 01/24/2019   CL 101 01/24/2019   CREATININE 0.73 01/24/2019   BUN 20 01/24/2019   CO2 32 01/24/2019   TSH 2.34 06/15/2010   INR 1.50 (H) 08/13/2011   HGBA1C 5.8 08/13/2012    MICROALBUR <0.7 03/19/2015    Mm 3d Screen Breast Bilateral  Result Date: 05/29/2019 CLINICAL DATA:  Screening. EXAM: DIGITAL SCREENING BILATERAL MAMMOGRAM WITH TOMO AND CAD COMPARISON:  Previous exam(s). ACR Breast Density Category b: There are scattered areas of fibroglandular density. FINDINGS: There are no findings suspicious for malignancy. Images were processed with CAD. IMPRESSION: No mammographic evidence of malignancy. A result letter of this screening mammogram will be mailed directly to the patient. RECOMMENDATION: Screening mammogram in one year. (Code:SM-B-01Y) BI-RADS CATEGORY  1: Negative. Electronically Signed   By: Ammie Ferrier M.D.   On: 05/29/2019 13:00     Assessment & Plan:  Plan  I am having Joanna Williams "[name]" maintain her denosumab, Multiple Vitamins-Minerals (PRESERVISION AREDS 2+MULTI VIT PO), neomycin-polymyxin b-dexamethasone, augmented betamethasone dipropionate, zolpidem, omeprazole, gabapentin, and atorvastatin.  Meds ordered this encounter  Medications  . zolpidem (AMBIEN) 5 MG tablet    Sig: 1 po qpm    Dispense:  90 tablet    Refill:  0  . omeprazole (PRILOSEC) 20 MG capsule    Sig: Take 1 capsule (20 mg total) by mouth daily.    Dispense:  90 capsule    Refill:  3  . gabapentin (NEURONTIN) 100 MG capsule    Sig: Take 1 capsule (100 mg total) by mouth at bedtime.    Dispense:  90 capsule    Refill:  1  . atorvastatin (LIPITOR) 40 MG tablet    Sig: Take 1 tablet (40 mg total) by mouth daily.    Dispense:  90 tablet    Refill:  1    Problem List Items Addressed This Visit      Unprioritized   Age-related osteoporosis without current pathological fracture   Relevant Orders   DG Bone Density   GERD (gastroesophageal reflux disease)    Stable Refill meds      Relevant Medications   omeprazole (PRILOSEC) 20 MG capsule   Hyperlipidemia LDL goal <100    Encouraged heart healthy diet, increase exercise, avoid trans fats, consider a  krill oil cap daily      Relevant Medications   atorvastatin (LIPITOR) 40 MG tablet   Hypothyroidism    Check labs       Low back pain with radiation    Stable con't gabapentin       Relevant Medications   gabapentin (NEURONTIN) 100 MG capsule   Murmur   Relevant Orders   ECHOCARDIOGRAM COMPLETE   Tongue lesion    Lesion has enlarged       Relevant Orders   Ambulatory referral to ENT    Other Visit Diagnoses    Hyperlipidemia, unspecified hyperlipidemia type    -  Primary   Relevant Medications   atorvastatin (LIPITOR) 40 MG tablet   Other Relevant Orders   Lipid panel   Comprehensive metabolic panel   Insomnia, unspecified type       Relevant Medications   zolpidem (AMBIEN) 5 MG tablet      Follow-up: Return in about 6 months (around 12/03/2019), or if symptoms worsen or fail to improve, for =-.  Ann Held, DO     +

## 2019-06-04 NOTE — Assessment & Plan Note (Signed)
Stable. Refill meds

## 2019-06-04 NOTE — Assessment & Plan Note (Signed)
Stable con't gabapentin

## 2019-06-20 ENCOUNTER — Ambulatory Visit (HOSPITAL_BASED_OUTPATIENT_CLINIC_OR_DEPARTMENT_OTHER)
Admission: RE | Admit: 2019-06-20 | Discharge: 2019-06-20 | Disposition: A | Discharge status: Home / Self Care | Payer: Medicare HMO | Source: Ambulatory Visit | Attending: Family Medicine | Admitting: Family Medicine

## 2019-06-20 ENCOUNTER — Other Ambulatory Visit: Payer: Self-pay

## 2019-06-20 DIAGNOSIS — M810 Age-related osteoporosis without current pathological fracture: Secondary | ICD-10-CM

## 2019-07-08 ENCOUNTER — Other Ambulatory Visit: Payer: Self-pay

## 2019-07-08 ENCOUNTER — Ambulatory Visit (HOSPITAL_BASED_OUTPATIENT_CLINIC_OR_DEPARTMENT_OTHER)
Admission: RE | Admit: 2019-07-08 | Discharge: 2019-07-08 | Disposition: A | Discharge status: Home / Self Care | Payer: Medicare HMO | Source: Ambulatory Visit | Attending: Family Medicine | Admitting: Family Medicine

## 2019-07-08 DIAGNOSIS — R011 Cardiac murmur, unspecified: Secondary | ICD-10-CM | POA: Diagnosis not present

## 2019-07-08 NOTE — Progress Notes (Signed)
  Echocardiogram 2D Echocardiogram has been performed.  [name] 07/08/2019, 9:47 AM

## 2019-07-10 ENCOUNTER — Encounter: Payer: Self-pay | Admitting: Family Medicine

## 2019-07-17 DIAGNOSIS — D3131 Benign neoplasm of right choroid: Secondary | ICD-10-CM | POA: Diagnosis not present

## 2019-07-17 DIAGNOSIS — H353112 Nonexudative age-related macular degeneration, right eye, intermediate dry stage: Secondary | ICD-10-CM | POA: Diagnosis not present

## 2019-07-17 DIAGNOSIS — H353223 Exudative age-related macular degeneration, left eye, with inactive scar: Secondary | ICD-10-CM | POA: Diagnosis not present

## 2019-07-17 DIAGNOSIS — H43813 Vitreous degeneration, bilateral: Secondary | ICD-10-CM | POA: Diagnosis not present

## 2019-07-23 ENCOUNTER — Encounter: Payer: Self-pay | Admitting: Family Medicine

## 2019-08-25 ENCOUNTER — Other Ambulatory Visit: Payer: Self-pay | Admitting: Internal Medicine

## 2020-01-15 DIAGNOSIS — H43813 Vitreous degeneration, bilateral: Secondary | ICD-10-CM | POA: Diagnosis not present

## 2020-01-15 DIAGNOSIS — H353112 Nonexudative age-related macular degeneration, right eye, intermediate dry stage: Secondary | ICD-10-CM | POA: Diagnosis not present

## 2020-01-15 DIAGNOSIS — D3131 Benign neoplasm of right choroid: Secondary | ICD-10-CM | POA: Diagnosis not present

## 2020-01-15 DIAGNOSIS — H353222 Exudative age-related macular degeneration, left eye, with inactive choroidal neovascularization: Secondary | ICD-10-CM | POA: Diagnosis not present

## 2020-02-22 ENCOUNTER — Other Ambulatory Visit: Payer: Self-pay | Admitting: Family Medicine

## 2020-02-22 DIAGNOSIS — E785 Hyperlipidemia, unspecified: Secondary | ICD-10-CM

## 2020-02-24 ENCOUNTER — Other Ambulatory Visit: Payer: Self-pay | Admitting: Family Medicine

## 2020-02-24 DIAGNOSIS — G4700 Insomnia, unspecified: Secondary | ICD-10-CM

## 2020-03-19 ENCOUNTER — Telehealth: Payer: Self-pay | Admitting: Family Medicine

## 2020-03-19 NOTE — Progress Notes (Signed)
  Chronic Care Management   Outreach Note  03/19/2020 Name: Joanna Williams MRN: [id number] DOB: 01-Aug-1940  Referred by: Ann Held, DO Reason for referral : No chief complaint on file.   An unsuccessful telephone outreach was attempted today. The patient was referred to the pharmacist for assistance with care management and care coordination.   Follow Up Plan:   Carley Perdue [employer] Scheduler

## 2020-04-07 ENCOUNTER — Telehealth: Payer: Self-pay | Admitting: Family Medicine

## 2020-04-07 NOTE — Progress Notes (Signed)
  Chronic Care Management   Note  04/07/2020 Name: Joanna Williams MRN: [id number] DOB: 1941/03/15  Joanna Williams is a 79 y.o. year old female who is a primary care patient of Ann Held, DO. I reached out to [name] by phone today in response to a referral sent by Ms. Delena Bali PCP, Ann Held, DO.   [name] was given information about Chronic Care Management services today including:  1. CCM service includes personalized support from designated clinical staff supervised by her physician, including individualized plan of care and coordination with other care providers 2. 24/7 contact phone numbers for assistance for urgent and routine care needs. 3. Service will only be billed when office clinical staff spend 20 minutes or more in a month to coordinate care. 4. Only one practitioner may furnish and bill the service in a calendar month. 5. The patient may stop CCM services at any time (effective at the end of the month) by phone call to the office staff.   Patient wishes to consider information provided and/or speak with a member of the care team before deciding about enrollment in care management services.   Follow up plan:   Carley Perdue [employer] Scheduler

## 2020-04-14 DIAGNOSIS — H353112 Nonexudative age-related macular degeneration, right eye, intermediate dry stage: Secondary | ICD-10-CM | POA: Diagnosis not present

## 2020-04-14 DIAGNOSIS — H353222 Exudative age-related macular degeneration, left eye, with inactive choroidal neovascularization: Secondary | ICD-10-CM | POA: Diagnosis not present

## 2020-04-27 ENCOUNTER — Other Ambulatory Visit: Payer: Self-pay | Admitting: Family Medicine

## 2020-04-27 DIAGNOSIS — G4700 Insomnia, unspecified: Secondary | ICD-10-CM

## 2020-04-28 NOTE — Telephone Encounter (Signed)
Last written: 06/04/19 Last ov: 06/04/19 Next ov: 06/09/20 Contract: 05/10/18 UDS:05/10/18

## 2020-06-09 ENCOUNTER — Ambulatory Visit (INDEPENDENT_AMBULATORY_CARE_PROVIDER_SITE_OTHER): Payer: Medicare HMO | Admitting: Family Medicine

## 2020-06-09 ENCOUNTER — Encounter: Payer: Self-pay | Admitting: Family Medicine

## 2020-06-09 ENCOUNTER — Other Ambulatory Visit: Payer: Self-pay

## 2020-06-09 VITALS — BP 120/60 | HR 87 | Temp 98.1°F | Resp 18 | Ht 64.0 in | Wt 135.8 lb

## 2020-06-09 DIAGNOSIS — K219 Gastro-esophageal reflux disease without esophagitis: Secondary | ICD-10-CM | POA: Diagnosis not present

## 2020-06-09 DIAGNOSIS — M5441 Lumbago with sciatica, right side: Secondary | ICD-10-CM

## 2020-06-09 DIAGNOSIS — G8929 Other chronic pain: Secondary | ICD-10-CM

## 2020-06-09 DIAGNOSIS — E785 Hyperlipidemia, unspecified: Secondary | ICD-10-CM

## 2020-06-09 DIAGNOSIS — Z0000 Encounter for general adult medical examination without abnormal findings: Secondary | ICD-10-CM | POA: Diagnosis not present

## 2020-06-09 DIAGNOSIS — E039 Hypothyroidism, unspecified: Secondary | ICD-10-CM

## 2020-06-09 DIAGNOSIS — G4700 Insomnia, unspecified: Secondary | ICD-10-CM | POA: Diagnosis not present

## 2020-06-09 MED ORDER — OMEPRAZOLE 20 MG PO CPDR: 20.0000 mg | DELAYED_RELEASE_CAPSULE | Freq: Every day | ORAL | 3 refills | Status: DC

## 2020-06-09 MED ORDER — MELOXICAM 7.5 MG PO TABS: ORAL_TABLET | ORAL | 2 refills | Status: DC

## 2020-06-09 MED ORDER — ATORVASTATIN CALCIUM 40 MG PO TABS: 40.0000 mg | ORAL_TABLET | Freq: Every day | ORAL | 1 refills | Status: DC

## 2020-06-09 MED ORDER — ZOLPIDEM TARTRATE 5 MG PO TABS: ORAL_TABLET | ORAL | 0 refills | Status: DC

## 2020-06-09 NOTE — Progress Notes (Addendum)
Subjective:     Joanna Williams is a 79 y.o. female and is here for a comprehensive physical exam. The patient reports no problems.  She needs refills and f/u but no new complaints   Social History   Socioeconomic History  . Marital status: Married    Spouse name: Not on file  . Number of children: Not on file  . Years of education: Not on file  . Highest education level: Not on file  Occupational History  . Occupation: retired    Fish farm manager: RETIRED  Tobacco Use  . Smoking status: Never Smoker  . Smokeless tobacco: Never Used  Substance and Sexual Activity  . Alcohol use: Yes    Alcohol/week: 1.0 standard drink    Types: 1 Glasses of wine per week  . Drug use: Yes    Types: Hydrocodone  . Sexual activity: Yes    Partners: Male  Other Topics Concern  . Not on file  Social History Narrative   Walking, yoga, pickle ball   Social Determinants of Health   Financial Resource Strain: Not on file  Food Insecurity: Not on file  Transportation Needs: Not on file  Physical Activity: Not on file  Stress: Not on file  Social Connections: Not on file  Intimate Partner Violence: Not on file   Health Maintenance  Topic Date Due  . URINE MICROALBUMIN  03/18/2016  . COVID-19 Vaccine (2 - Pfizer 3-dose booster series) 08/13/2019  . INFLUENZA VACCINE  02/02/2020  . MAMMOGRAM  05/27/2020  . TETANUS/TDAP  03/27/2029  . DEXA SCAN  Completed  . PNA vac Low Risk Adult  Completed    The following portions of the patient's history were reviewed and updated as appropriate:  She  has a past medical history of GERD (gastroesophageal reflux disease), Hyperlipidemia, and Macular degeneration (05/04/2017). She does not have any pertinent problems on file. She  has a past surgical history that includes Colonoscopy (2 yrs ago); Total hip arthroplasty (08/10/2011); and Eye surgery (Bilateral, 2014). Her family history includes Cancer (age of onset: 10) in her father; Heart disease in her  mother. She  reports that she has never smoked. She has never used smokeless tobacco. She reports current alcohol use of about 1.0 standard drink of alcohol per week. She reports current drug use. Drug: Hydrocodone. She has a current medication list which includes the following prescription(s): atorvastatin, augmented betamethasone dipropionate, denosumab, multiple vitamins-minerals, neomycin-polymyxin b-dexamethasone, omeprazole, zolpidem, and meloxicam. Current Outpatient Medications on File Prior to Visit  Medication Sig Dispense Refill  . augmented betamethasone dipropionate (DIPROLENE-AF) 0.05 % cream APPLY TO AFFECTED AREA TWICE A DAY 60 g 0  . denosumab (PROLIA) 60 MG/ML SOLN injection Inject 60 mg into the skin every 6 (six) months. Administer in upper arm, thigh, or abdomen    . Multiple Vitamins-Minerals (PRESERVISION AREDS 2+MULTI VIT PO) Take 2 tablets by mouth daily.    Marland Kitchen neomycin-polymyxin b-dexamethasone (MAXITROL) 3.5-10000-0.1 OINT      No current facility-administered medications on file prior to visit.   She is allergic to niacin and sulfa antibiotics..  Review of Systems Review of Systems  Constitutional: Negative for activity change, appetite change and fatigue.  HENT: Negative for hearing loss, congestion, tinnitus and ear discharge.  dentist q87m Eyes: Negative for visual disturbance (see optho q1y -- vision corrected to 20/20 with glasses).  Respiratory: Negative for cough, chest tightness and shortness of breath.   Cardiovascular: Negative for chest pain, palpitations and leg swelling.  Gastrointestinal: Negative  for abdominal pain, diarrhea, constipation and abdominal distention.  Genitourinary: Negative for urgency, frequency, decreased urine volume and difficulty urinating.  Musculoskeletal: Negative for back pain, arthralgias and gait problem.  Skin: Negative for color change, pallor and rash.  Neurological: Negative for dizziness, light-headedness, numbness and  headaches.  Hematological: Negative for adenopathy. Does not bruise/bleed easily.  Psychiatric/Behavioral: Negative for suicidal ideas, confusion, sleep disturbance, self-injury, dysphoric mood, decreased concentration and agitation.       Objective:    BP 120/60 (BP Location: Left Arm, Patient Position: Sitting, Cuff Size: Normal)   Pulse 87   Temp 98.1 F (36.7 C) (Oral)   Resp 18   Ht 5\' 4"  (1.626 m)   Wt 135 lb 12.8 oz (61.6 kg)   SpO2 98%   BMI 23.31 kg/m  General appearance: alert, cooperative, appears stated age and no distress Head: Normocephalic, without obvious abnormality, atraumatic Eyes: negative findings: lids and lashes normal, conjunctivae and sclerae normal and pupils equal, round, reactive to light and accomodation Ears: normal TM's and external ear canals both ears Neck: no adenopathy, no carotid bruit, no JVD, supple, symmetrical, trachea midline and thyroid not enlarged, symmetric, no tenderness/mass/nodules Back: symmetric, no curvature. ROM normal. No CVA tenderness. Lungs: clear to auscultation bilaterally Heart: regular rate and rhythm, S1, S2 normal, no murmur, click, rub or gallop Abdomen: soft, non-tender; bowel sounds normal; no masses,  no organomegaly Pelvic: not indicated; post-menopausal, no abnormal Pap smears in past Extremities: extremities normal, atraumatic, no cyanosis or edema Pulses: 2+ and symmetric Skin: Skin color, texture, turgor normal. No rashes or lesions Lymph nodes: Cervical, supraclavicular, and axillary nodes normal. Neurologic: Alert and oriented X 3, normal strength and tone. Normal symmetric reflexes. Normal coordination and gait    Assessment:    Healthy female exam.      Plan:    ghm utd Check labs  See After Visit Summary for Counseling Recommendations    1. Chronic bilateral low back pain with right-sided sciatica Pt had requested celebrex but she is allergic to sulfa---  Will try mobic  - meloxicam (MOBIC) 7.5  MG tablet; 1 po bid prn  Dispense: 60 tablet; Refill: 2  2. Hyperlipidemia, unspecified hyperlipidemia type Encouraged heart healthy diet, increase exercise, avoid trans fats, consider a krill oil cap daily - Lipid panel; Future - Comprehensive metabolic panel; Future - atorvastatin (LIPITOR) 40 MG tablet; Take 1 tablet (40 mg total) by mouth daily.  Dispense: 90 tablet; Refill: 1  3. Gastroesophageal reflux disease without esophagitis Stable  - omeprazole (PRILOSEC) 20 MG capsule; Take 1 capsule (20 mg total) by mouth daily.  Dispense: 90 capsule; Refill: 3  4. Insomnia, unspecified type Pt only takes prn ambien - zolpidem (AMBIEN) 5 MG tablet; TAKE 1 TABLET BY MOUTH EVERY DAY IN THE EVENING  Dispense: 90 tablet; Refill: 0  5. Hypothyroidism, unspecified type On no meds- check labs  - TSH; Future

## 2020-06-09 NOTE — Patient Instructions (Signed)
Preventive Care 80 Years and Older, Female Preventive care refers to lifestyle choices and visits with your health care provider that can promote health and wellness. This includes:  A yearly physical exam. This is also called an annual well check.  Regular dental and eye exams.  Immunizations.  Screening for certain conditions.  Healthy lifestyle choices, such as diet and exercise. What can I expect for my preventive care visit? Physical exam Your health care provider will check:  Height and weight. These may be used to calculate body mass index (BMI), which is a measurement that tells if you are at a healthy weight.  Heart rate and blood pressure.  Your skin for abnormal spots. Counseling Your health care provider may ask you questions about:  Alcohol, tobacco, and drug use.  Emotional well-being.  Home and relationship well-being.  Sexual activity.  Eating habits.  History of falls.  Memory and ability to understand (cognition).  Work and work Statistician.  Pregnancy and menstrual history. What immunizations do I need?  Influenza (flu) vaccine  This is recommended every year. Tetanus, diphtheria, and pertussis (Tdap) vaccine  You may need a Td booster every 10 years. Varicella (chickenpox) vaccine  You may need this vaccine if you have not already been vaccinated. Zoster (shingles) vaccine  You may need this after age 33. Pneumococcal conjugate (PCV13) vaccine  One dose is recommended after age 33. Pneumococcal polysaccharide (PPSV23) vaccine  One dose is recommended after age 72. Measles, mumps, and rubella (MMR) vaccine  You may need at least one dose of MMR if you were born in 1957 or later. You may also need a second dose. Meningococcal conjugate (MenACWY) vaccine  You may need this if you have certain conditions. Hepatitis A vaccine  You may need this if you have certain conditions or if you travel or work in places where you may be exposed  to hepatitis A. Hepatitis B vaccine  You may need this if you have certain conditions or if you travel or work in places where you may be exposed to hepatitis B. Haemophilus influenzae type b (Hib) vaccine  You may need this if you have certain conditions. You may receive vaccines as individual doses or as more than one vaccine together in one shot (combination vaccines). Talk with your health care provider about the risks and benefits of combination vaccines. What tests do I need? Blood tests  Lipid and cholesterol levels. These may be checked every 5 years, or more frequently depending on your overall health.  Hepatitis C test.  Hepatitis B test. Screening  Lung cancer screening. You may have this screening every year starting at age 39 if you have a 30-pack-year history of smoking and currently smoke or have quit within the past 15 years.  Colorectal cancer screening. All adults should have this screening starting at age 36 and continuing until age 15. Your health care provider may recommend screening at age 23 if you are at increased risk. You will have tests every 1-10 years, depending on your results and the type of screening test.  Diabetes screening. This is done by checking your blood sugar (glucose) after you have not eaten for a while (fasting). You may have this done every 1-3 years.  Mammogram. This may be done every 1-2 years. Talk with your health care provider about how often you should have regular mammograms.  BRCA-related cancer screening. This may be done if you have a family history of breast, ovarian, tubal, or peritoneal cancers.  Other tests  Sexually transmitted disease (STD) testing.  Bone density scan. This is done to screen for osteoporosis. You may have this done starting at age 44. Follow these instructions at home: Eating and drinking  Eat a diet that includes fresh fruits and vegetables, whole grains, lean protein, and low-fat dairy products. Limit  your intake of foods with high amounts of sugar, saturated fats, and salt.  Take vitamin and mineral supplements as recommended by your health care provider.  Do not drink alcohol if your health care provider tells you not to drink.  If you drink alcohol: ? Limit how much you have to 0-1 drink a day. ? Be aware of how much alcohol is in your drink. In the [place] one drink equals one 12 oz bottle of beer (355 mL), one 5 oz glass of wine (148 mL), or one 1 oz glass of hard liquor (44 mL). Lifestyle  Take daily care of your teeth and gums.  Stay active. Exercise for at least 30 minutes on 5 or more days each week.  Do not use any products that contain nicotine or tobacco, such as cigarettes, e-cigarettes, and chewing tobacco. If you need help quitting, ask your health care provider.  If you are sexually active, practice safe sex. Use a condom or other form of protection in order to prevent STIs (sexually transmitted infections).  Talk with your health care provider about taking a low-dose aspirin or statin. What's next?  Go to your health care provider once a year for a well check visit.  Ask your health care provider how often you should have your eyes and teeth checked.  Stay up to date on all vaccines. This information is not intended to replace advice given to you by your health care provider. Make sure you discuss any questions you have with your health care provider. Document Revised: 06/14/2018 Document Reviewed: 06/14/2018 Elsevier Patient Education  2020 [employer].

## 2020-06-18 ENCOUNTER — Other Ambulatory Visit (INDEPENDENT_AMBULATORY_CARE_PROVIDER_SITE_OTHER): Payer: Medicare HMO

## 2020-06-18 ENCOUNTER — Other Ambulatory Visit: Payer: Self-pay

## 2020-06-18 DIAGNOSIS — E785 Hyperlipidemia, unspecified: Secondary | ICD-10-CM | POA: Diagnosis not present

## 2020-06-18 DIAGNOSIS — E039 Hypothyroidism, unspecified: Secondary | ICD-10-CM

## 2020-06-18 LAB — COMPREHENSIVE METABOLIC PANEL
ALT: 17 U/L (ref 0–35)
AST: 19 U/L (ref 0–37)
Albumin: 4.2 g/dL (ref 3.5–5.2)
Alkaline Phosphatase: 86 U/L (ref 39–117)
BUN: 23 mg/dL (ref 6–23)
CO2: 32 mEq/L (ref 19–32)
Calcium: 9.3 mg/dL (ref 8.4–10.5)
Chloride: 104 mEq/L (ref 96–112)
Creatinine, Ser: 0.71 mg/dL (ref 0.40–1.20)
GFR: 80.27 mL/min (ref >60.00)
Glucose, Bld: 83 mg/dL (ref 70–99)
Potassium: 4.7 mEq/L (ref 3.5–5.1)
Sodium: 141 mEq/L (ref 135–145)
Total Bilirubin: 0.5 mg/dL (ref 0.2–1.2)
Total Protein: 6.4 g/dL (ref 6.0–8.3)

## 2020-06-18 LAB — LIPID PANEL
Cholesterol: 161 mg/dL (ref 0–200)
HDL: 55.3 mg/dL (ref >39.00)
LDL Cholesterol: 85 mg/dL (ref 0–99)
NonHDL: 105.78
Total CHOL/HDL Ratio: 3
Triglycerides: 106 mg/dL (ref 0.0–149.0)
VLDL: 21.2 mg/dL (ref 0.0–40.0)

## 2020-06-18 LAB — TSH: TSH: 6.03 u[IU]/mL — ABNORMAL HIGH (ref 0.35–4.50)

## 2020-06-24 ENCOUNTER — Other Ambulatory Visit: Payer: Self-pay | Admitting: Family Medicine

## 2020-06-24 DIAGNOSIS — E039 Hypothyroidism, unspecified: Secondary | ICD-10-CM

## 2020-07-06 ENCOUNTER — Other Ambulatory Visit (HOSPITAL_BASED_OUTPATIENT_CLINIC_OR_DEPARTMENT_OTHER): Payer: Self-pay | Admitting: Family Medicine

## 2020-07-06 ENCOUNTER — Telehealth: Payer: Self-pay

## 2020-07-06 DIAGNOSIS — Z1231 Encounter for screening mammogram for malignant neoplasm of breast: Secondary | ICD-10-CM

## 2020-07-06 NOTE — Telephone Encounter (Signed)
Patient called to schedule prolia vaccine. However, patient has not had injection since 2020. I have reverified her insurance information. Waiting on summary of benefits from insurance. Will call patient to schedule once back.

## 2020-07-09 NOTE — Telephone Encounter (Signed)
Patient is follow up on her last call, she wants to know when will she be able to get her Prolia Vaccine.  Please Advice

## 2020-07-10 NOTE — Telephone Encounter (Signed)
Just let pt know we are waiting on her ins co to approve

## 2020-07-10 NOTE — Telephone Encounter (Signed)
Attempted to call pt but phone just continued to ring. No able to LVM

## 2020-07-13 NOTE — Telephone Encounter (Signed)
Pt was sent an unable to reach letter.

## 2020-07-13 NOTE — Telephone Encounter (Signed)
Attempted to call pt to let her know what provider advised. Unable to LVM.

## 2020-07-14 NOTE — Telephone Encounter (Signed)
Patient called office back and I told patient we are waiting on her insurance to approve/deny the prolia injections , she understood and stated she would like a follow up call after we hear from the insurance company.   Call back   [phone]

## 2020-07-14 NOTE — Telephone Encounter (Signed)
Ok to schedule patients prolia after the 20th.

## 2020-07-15 DIAGNOSIS — H35371 Puckering of macula, right eye: Secondary | ICD-10-CM | POA: Diagnosis not present

## 2020-07-15 DIAGNOSIS — H353223 Exudative age-related macular degeneration, left eye, with inactive scar: Secondary | ICD-10-CM | POA: Diagnosis not present

## 2020-07-15 DIAGNOSIS — D3131 Benign neoplasm of right choroid: Secondary | ICD-10-CM | POA: Diagnosis not present

## 2020-07-15 DIAGNOSIS — H353113 Nonexudative age-related macular degeneration, right eye, advanced atrophic without subfoveal involvement: Secondary | ICD-10-CM | POA: Diagnosis not present

## 2020-07-20 ENCOUNTER — Ambulatory Visit (HOSPITAL_BASED_OUTPATIENT_CLINIC_OR_DEPARTMENT_OTHER): Payer: Medicare HMO

## 2020-07-21 NOTE — Telephone Encounter (Signed)
Patients prolia approved from 07/14/2020-07-03-2021 Patient aware and scheduled for injection.

## 2020-07-24 ENCOUNTER — Other Ambulatory Visit: Payer: Self-pay

## 2020-07-24 ENCOUNTER — Ambulatory Visit (INDEPENDENT_AMBULATORY_CARE_PROVIDER_SITE_OTHER): Payer: Medicare HMO | Admitting: *Deleted

## 2020-07-24 DIAGNOSIS — M810 Age-related osteoporosis without current pathological fracture: Secondary | ICD-10-CM | POA: Diagnosis not present

## 2020-07-24 MED ORDER — DENOSUMAB 60 MG/ML ~~LOC~~ SOSY
60.0000 mg | PREFILLED_SYRINGE | Freq: Once | SUBCUTANEOUS | Status: AC
  Administered 2020-07-24: 60 mg via SUBCUTANEOUS

## 2020-07-24 NOTE — Progress Notes (Signed)
Patient her for prolia injection per Dr. Etter Sjogren  Injection given in left arm and patient tolerated well.

## 2020-09-02 ENCOUNTER — Other Ambulatory Visit: Payer: Self-pay | Admitting: Family Medicine

## 2020-09-02 DIAGNOSIS — G8929 Other chronic pain: Secondary | ICD-10-CM

## 2020-09-19 ENCOUNTER — Other Ambulatory Visit: Payer: Self-pay

## 2020-09-19 ENCOUNTER — Ambulatory Visit (HOSPITAL_BASED_OUTPATIENT_CLINIC_OR_DEPARTMENT_OTHER)
Admission: RE | Admit: 2020-09-19 | Discharge: 2020-09-19 | Disposition: A | Discharge status: Home / Self Care | Payer: Medicare HMO | Source: Ambulatory Visit | Attending: Family Medicine | Admitting: Family Medicine

## 2020-09-19 DIAGNOSIS — Z1231 Encounter for screening mammogram for malignant neoplasm of breast: Secondary | ICD-10-CM | POA: Diagnosis not present

## 2020-12-14 ENCOUNTER — Other Ambulatory Visit: Payer: Self-pay

## 2020-12-14 ENCOUNTER — Other Ambulatory Visit: Payer: Self-pay | Admitting: Family Medicine

## 2020-12-14 ENCOUNTER — Ambulatory Visit (HOSPITAL_BASED_OUTPATIENT_CLINIC_OR_DEPARTMENT_OTHER)
Admission: RE | Admit: 2020-12-14 | Discharge: 2020-12-14 | Disposition: A | Discharge status: Home / Self Care | Payer: Medicare HMO | Source: Ambulatory Visit | Attending: Family Medicine | Admitting: Family Medicine

## 2020-12-14 ENCOUNTER — Encounter: Payer: Self-pay | Admitting: Family Medicine

## 2020-12-14 ENCOUNTER — Ambulatory Visit (INDEPENDENT_AMBULATORY_CARE_PROVIDER_SITE_OTHER): Payer: Medicare HMO | Admitting: Family Medicine

## 2020-12-14 VITALS — BP 110/70 | HR 74 | Temp 98.4°F | Resp 18 | Ht 64.0 in | Wt 133.8 lb

## 2020-12-14 DIAGNOSIS — M5441 Lumbago with sciatica, right side: Secondary | ICD-10-CM

## 2020-12-14 DIAGNOSIS — E785 Hyperlipidemia, unspecified: Secondary | ICD-10-CM

## 2020-12-14 DIAGNOSIS — G4700 Insomnia, unspecified: Secondary | ICD-10-CM

## 2020-12-14 DIAGNOSIS — E039 Hypothyroidism, unspecified: Secondary | ICD-10-CM | POA: Diagnosis not present

## 2020-12-14 DIAGNOSIS — K219 Gastro-esophageal reflux disease without esophagitis: Secondary | ICD-10-CM | POA: Diagnosis not present

## 2020-12-14 DIAGNOSIS — M545 Low back pain, unspecified: Secondary | ICD-10-CM | POA: Insufficient documentation

## 2020-12-14 DIAGNOSIS — G8929 Other chronic pain: Secondary | ICD-10-CM | POA: Diagnosis not present

## 2020-12-14 DIAGNOSIS — M5440 Lumbago with sciatica, unspecified side: Secondary | ICD-10-CM

## 2020-12-14 LAB — COMPREHENSIVE METABOLIC PANEL
ALT: 20 U/L (ref 0–35)
AST: 22 U/L (ref 0–37)
Albumin: 4.2 g/dL (ref 3.5–5.2)
Alkaline Phosphatase: 83 U/L (ref 39–117)
BUN: 26 mg/dL — ABNORMAL HIGH (ref 6–23)
CO2: 31 mEq/L (ref 19–32)
Calcium: 9.2 mg/dL (ref 8.4–10.5)
Chloride: 104 mEq/L (ref 96–112)
Creatinine, Ser: 0.74 mg/dL (ref 0.40–1.20)
GFR: 76.12 mL/min (ref >60.00)
Glucose, Bld: 59 mg/dL — ABNORMAL LOW (ref 70–99)
Potassium: 4.6 mEq/L (ref 3.5–5.1)
Sodium: 142 mEq/L (ref 135–145)
Total Bilirubin: 0.5 mg/dL (ref 0.2–1.2)
Total Protein: 6.2 g/dL (ref 6.0–8.3)

## 2020-12-14 LAB — LIPID PANEL
Cholesterol: 161 mg/dL (ref 0–200)
HDL: 52.1 mg/dL (ref >39.00)
NonHDL: 108.77
Total CHOL/HDL Ratio: 3
Triglycerides: 202 mg/dL — ABNORMAL HIGH (ref 0.0–149.0)
VLDL: 40.4 mg/dL — ABNORMAL HIGH (ref 0.0–40.0)

## 2020-12-14 LAB — LDL CHOLESTEROL, DIRECT: Direct LDL: 84 mg/dL

## 2020-12-14 LAB — TSH: TSH: 3.99 u[IU]/mL (ref 0.35–4.50)

## 2020-12-14 MED ORDER — OMEPRAZOLE 20 MG PO CPDR: 20.0000 mg | DELAYED_RELEASE_CAPSULE | Freq: Every day | ORAL | 3 refills | Status: DC

## 2020-12-14 MED ORDER — ATORVASTATIN CALCIUM 40 MG PO TABS: 40.0000 mg | ORAL_TABLET | Freq: Every day | ORAL | 1 refills | Status: DC

## 2020-12-14 MED ORDER — TIZANIDINE HCL 4 MG PO TABS: 4.0000 mg | ORAL_TABLET | Freq: Four times a day (QID) | ORAL | 0 refills | Status: DC | PRN

## 2020-12-14 MED ORDER — ZOLPIDEM TARTRATE 5 MG PO TABS: ORAL_TABLET | ORAL | 0 refills | Status: DC

## 2020-12-14 NOTE — Patient Instructions (Signed)
Acute Back Pain, Adult Acute back pain is sudden and usually short-lived. It is often caused by an injury to the muscles and tissues in the back. The injury may result from: A muscle or ligament getting overstretched or torn (strained). Ligaments are tissues that connect bones to each other. Lifting something improperly can cause a back strain. Wear and tear (degeneration) of the spinal disks. Spinal disks are circular tissue that provide cushioning between the bones of the spine (vertebrae). Twisting motions, such as while playing sports or doing yard work. A hit to the back. Arthritis. You may have a physical exam, lab tests, and imaging tests to find the cause ofyour pain. Acute back pain usually goes away with rest and home care. Follow these instructions at home: Managing pain, stiffness, and swelling Treatment may include medicines for pain and inflammation that are taken by mouth or applied to the skin, prescription pain medicine, or muscle relaxants. Take over-the-counter and prescription medicines only as told by your health care provider. Your health care provider may recommend applying ice during the first 24-48 hours after your pain starts. To do this: Put ice in a plastic bag. Place a towel between your skin and the bag. Leave the ice on for 20 minutes, 2-3 times a day. If directed, apply heat to the affected area as often as told by your health care provider. Use the heat source that your health care provider recommends, such as a moist heat pack or a heating pad. Place a towel between your skin and the heat source. Leave the heat on for 20-30 minutes. Remove the heat if your skin turns bright red. This is especially important if you are unable to feel pain, heat, or cold. You have a greater risk of getting burned. Activity  Do not stay in bed. Staying in bed for more than 1-2 days can delay your recovery. Sit up and stand up straight. Avoid leaning forward when you sit or  hunching over when you stand. If you work at a desk, sit close to it so you do not need to lean over. Keep your chin tucked in. Keep your neck drawn back, and keep your elbows bent at a 90-degree angle (right angle). Sit high and close to the steering wheel when you drive. Add lower back (lumbar) support to your car seat, if needed. Take short walks on even surfaces as soon as you are able. Try to increase the length of time you walk each day. Do not sit, drive, or stand in one place for more than 30 minutes at a time. Sitting or standing for long periods of time can put stress on your back. Do not drive or use heavy machinery while taking prescription pain medicine. Use proper lifting techniques. When you bend and lift, use positions that put less stress on your back: Bend your knees. Keep the load close to your body. Avoid twisting. Exercise regularly as told by your health care provider. Exercising helps your back heal faster and helps prevent back injuries by keeping muscles strong and flexible. Work with a physical therapist to make a safe exercise program, as recommended by your health care provider. Do any exercises as told by your physical therapist.  Lifestyle Maintain a healthy weight. Extra weight puts stress on your back and makes it difficult to have good posture. Avoid activities or situations that make you feel anxious or stressed. Stress and anxiety increase muscle tension and can make back pain worse. Learn ways to manage   anxiety and stress, such as through exercise. General instructions Sleep on a firm mattress in a comfortable position. Try lying on your side with your knees slightly bent. If you lie on your back, put a pillow under your knees. Follow your treatment plan as told by your health care provider. This may include: Cognitive or behavioral therapy. Acupuncture or massage therapy. Meditation or yoga. Contact a health care provider if: You have pain that is not  relieved with rest or medicine. You have increasing pain going down into your legs or buttocks. Your pain does not improve after 2 weeks. You have pain at night. You lose weight without trying. You have a fever or chills. Get help right away if: You develop new bowel or bladder control problems. You have unusual weakness or numbness in your arms or legs. You develop nausea or vomiting. You develop abdominal pain. You feel faint. Summary Acute back pain is sudden and usually short-lived. Use proper lifting techniques. When you bend and lift, use positions that put less stress on your back. Take over-the-counter and prescription medicines and apply heat or ice as directed by your health care provider. This information is not intended to replace advice given to you by your health care provider. Make sure you discuss any questions you have with your healthcare provider. Document Revised: 03/10/2020 Document Reviewed: 03/13/2020 Elsevier Patient Education  2022 [employer]  

## 2020-12-14 NOTE — Assessment & Plan Note (Signed)
Check labs today.

## 2020-12-14 NOTE — Progress Notes (Signed)
Patient ID: Joanna Williams, female    DOB: 1941-01-17  Age: 80 y.o. MRN: [id number]    Subjective:  Subjective  HPI Joanna Williams presents for an office visit and follow up on thyroid and cholesterol today. She complains of lower back pain x1 months. She notes that the pain radiates to the bilateral knees. She reports that the pain improves with movement. She endorses taking 7.5 mg of Mobic PO 2 Daily and Aspercreme, however it helps somewhat. She states that she had not fallen recently.  She also complains of sleep disturbance secondary to lower back pain.  She endorses taking 40 mg of Lipitor PO Daily for her dx of HLD.  Lab Results  Component Value Date   CHOL 161 06/18/2020   CHOL 175 06/04/2019   CHOL 168 11/15/2018   Lab Results  Component Value Date   HDL 55.30 06/18/2020   HDL 53.00 06/04/2019   HDL 56.90 11/15/2018   Lab Results  Component Value Date   LDLCALC 85 06/18/2020   LDLCALC 104 (H) 06/04/2019   LDLCALC 94 11/15/2018   Lab Results  Component Value Date   TRIG 106.0 06/18/2020   TRIG 93.0 06/04/2019   TRIG 85.0 11/15/2018   Lab Results  Component Value Date   CHOLHDL 3 06/18/2020   CHOLHDL 3 06/04/2019   CHOLHDL 3 11/15/2018   Lab Results  Component Value Date   LDLDIRECT 208.0 09/17/2015   LDLDIRECT 142.1 08/13/2012   LDLDIRECT 132.2 09/22/2011   She denies any chest pain, SOB, fever, abdominal pain, cough, chills, sore throat, dysuria, urinary incontinence, HA, or N/V/D at this time.   Review of Systems  Constitutional:  Negative for chills, fatigue and fever.  HENT:  Negative for ear pain, rhinorrhea, sinus pressure, sinus pain, sore throat and tinnitus.   Eyes:  Negative for pain.  Respiratory:  Negative for cough, shortness of breath and wheezing.   Cardiovascular:  Negative for chest pain.  Gastrointestinal:  Negative for abdominal pain, anal bleeding, constipation, diarrhea, nausea and vomiting.  Genitourinary:  Negative for flank  pain.  Musculoskeletal:  Positive for back pain (Lower). Negative for neck pain.       (+) Bilateral LE pain    Skin:  Negative for rash.  Neurological:  Negative for seizures, weakness, light-headedness, numbness and headaches.  Psychiatric/Behavioral:  Positive for sleep disturbance.    History Past Medical History:  Diagnosis Date   GERD (gastroesophageal reflux disease)    Hyperlipidemia    Macular degeneration 05/04/2017    She has a past surgical history that includes Colonoscopy (2 yrs ago); Total hip arthroplasty (08/10/2011); and Eye surgery (Bilateral, 2014).   Her family history includes Cancer (age of onset: 60) in her father; Heart disease in her mother.She reports that she has never smoked. She has never used smokeless tobacco. She reports current alcohol use of about 1.0 standard drink of alcohol per week. She reports current drug use. Drug: Hydrocodone.  Current Outpatient Medications on File Prior to Visit  Medication Sig Dispense Refill   augmented betamethasone dipropionate (DIPROLENE-AF) 0.05 % cream APPLY TO AFFECTED AREA TWICE A DAY 60 g 0   denosumab (PROLIA) 60 MG/ML SOLN injection Inject 60 mg into the skin every 6 (six) months. Administer in upper arm, thigh, or abdomen     meloxicam (MOBIC) 7.5 MG tablet Take 1 tablet (7.5 mg total) by mouth 2 (two) times daily as needed for pain. 60 tablet 2   Multiple Vitamins-Minerals (PRESERVISION AREDS  2+MULTI VIT PO) Take 2 tablets by mouth daily.     neomycin-polymyxin b-dexamethasone (MAXITROL) 3.5-10000-0.1 OINT      No current facility-administered medications on file prior to visit.     Objective:  Objective  Physical Exam Vitals and nursing note reviewed.  Constitutional:      General: She is not in acute distress.    Appearance: Normal appearance. She is well-developed. She is not ill-appearing.  HENT:     Head: Normocephalic and atraumatic.     Right Ear: External ear normal.     Left Ear: External ear  normal.     Nose: Nose normal.  Eyes:     General:        Right eye: No discharge.        Left eye: No discharge.     Extraocular Movements: Extraocular movements intact.     Pupils: Pupils are equal, round, and reactive to light.  Cardiovascular:     Rate and Rhythm: Normal rate and regular rhythm.     Pulses: Normal pulses.     Heart sounds: Normal heart sounds. No murmur heard.   No friction rub. No gallop.  Pulmonary:     Effort: Pulmonary effort is normal. No respiratory distress.     Breath sounds: Normal breath sounds. No stridor. No wheezing, rhonchi or rales.  Chest:     Chest wall: No tenderness.  Abdominal:     General: Bowel sounds are normal. There is no distension.     Palpations: Abdomen is soft. There is no mass.     Tenderness: There is no abdominal tenderness. There is no guarding or rebound.     Hernia: No hernia is present.  Musculoskeletal:        General: Normal range of motion.     Cervical back: Normal range of motion and neck supple.     Lumbar back: Tenderness present.     Right lower leg: Tenderness present. No edema.     Left lower leg: Tenderness present. No edema.     Comments: There is tenderness present in the bilateral dorsal region of the thigh and lumbar region of the spine. However, there is 5/5 bilateral strength present and bilateral patella reflexes are equal.  Pain with testing of muscle strength but no pain with movement -- pt has full rom  Skin:    General: Skin is warm and dry.  Neurological:     Mental Status: She is alert and oriented to person, place, and time.  Psychiatric:        Behavior: Behavior normal.        Thought Content: Thought content normal.   BP 110/70 (BP Location: Left Arm, Patient Position: Sitting, Cuff Size: Normal)   Pulse 74   Temp 98.4 F (36.9 C) (Oral)   Resp 18   Ht 5\' 4"  (1.626 m)   Wt 133 lb 12.8 oz (60.7 kg)   SpO2 98%   BMI 22.97 kg/m  Wt Readings from Last 3 Encounters:  12/14/20 133 lb 12.8  oz (60.7 kg)  06/09/20 135 lb 12.8 oz (61.6 kg)  06/04/19 133 lb 3.2 oz (60.4 kg)     Lab Results  Component Value Date   WBC 6.3 05/04/2017   HGB 14.2 05/04/2017   HCT 42.6 05/04/2017   PLT 241.0 05/04/2017   GLUCOSE 83 06/18/2020   CHOL 161 06/18/2020   TRIG 106.0 06/18/2020   HDL 55.30 06/18/2020   LDLDIRECT 208.0 09/17/2015  LDLCALC 85 06/18/2020   ALT 17 06/18/2020   AST 19 06/18/2020   NA 141 06/18/2020   K 4.7 06/18/2020   CL 104 06/18/2020   CREATININE 0.71 06/18/2020   BUN 23 06/18/2020   CO2 32 06/18/2020   TSH 6.03 (H) 06/18/2020   INR 1.50 (H) 08/13/2011   HGBA1C 5.8 08/13/2012   MICROALBUR <0.7 03/19/2015    MM 3D SCREEN BREAST BILATERAL  Result Date: 09/21/2020 CLINICAL DATA:  Screening. EXAM: DIGITAL SCREENING BILATERAL MAMMOGRAM WITH TOMOSYNTHESIS AND CAD TECHNIQUE: Bilateral screening digital craniocaudal and mediolateral oblique mammograms were obtained. Bilateral screening digital breast tomosynthesis was performed. The images were evaluated with computer-aided detection. COMPARISON:  Previous exam(s). ACR Breast Density Category b: There are scattered areas of fibroglandular density. FINDINGS: There are no findings suspicious for malignancy. The images were evaluated with computer-aided detection. IMPRESSION: No mammographic evidence of malignancy. A result letter of this screening mammogram will be mailed directly to the patient. RECOMMENDATION: Screening mammogram in one year. (Code:SM-B-01Y) BI-RADS CATEGORY  1: Negative. Electronically Signed   By: Kristopher Oppenheim M.D.   On: 09/21/2020 15:58     Assessment & Plan:  Plan    Meds ordered this encounter  Medications   atorvastatin (LIPITOR) 40 MG tablet    Sig: Take 1 tablet (40 mg total) by mouth daily.    Dispense:  90 tablet    Refill:  1   omeprazole (PRILOSEC) 20 MG capsule    Sig: Take 1 capsule (20 mg total) by mouth daily.    Dispense:  90 capsule    Refill:  3   zolpidem (AMBIEN) 5 MG  tablet    Sig: TAKE 1 TABLET BY MOUTH EVERY DAY IN THE EVENING    Dispense:  90 tablet    Refill:  0    This request is for a new prescription for a controlled substance as required by Federal/State law.   tiZANidine (ZANAFLEX) 4 MG tablet    Sig: Take 1 tablet (4 mg total) by mouth every 6 (six) hours as needed for muscle spasms.    Dispense:  30 tablet    Refill:  0    Problem List Items Addressed This Visit       Unprioritized   GERD (gastroesophageal reflux disease)   Relevant Medications   omeprazole (PRILOSEC) 20 MG capsule   Hyperlipidemia LDL goal <100    Encourage heart healthy diet such as MIND or DASH diet, increase exercise, avoid trans fats, simple carbohydrates and processed foods, consider a krill or fish or flaxseed oil cap daily.        Relevant Medications   atorvastatin (LIPITOR) 40 MG tablet   Hypothyroidism - Primary    Check labs today       Relevant Orders   TSH   Low back pain with radiation    Check xray today con't meloxicam Added muscle relaxer  Consider PT vs ortho/sport med rto prn       Relevant Medications   tiZANidine (ZANAFLEX) 4 MG tablet   Other Relevant Orders   DG Lumbar Spine Complete   Other Visit Diagnoses     Hyperlipidemia, unspecified hyperlipidemia type       Relevant Medications   atorvastatin (LIPITOR) 40 MG tablet   Other Relevant Orders   Lipid panel   Comprehensive metabolic panel   Chronic bilateral low back pain with right-sided sciatica       Relevant Medications   zolpidem (AMBIEN) 5 MG tablet  tiZANidine (ZANAFLEX) 4 MG tablet   Insomnia, unspecified type       Relevant Medications   zolpidem (AMBIEN) 5 MG tablet       Follow-up: Return in about 6 months (around 06/15/2021), or if symptoms worsen or fail to improve, for fasting, annual exam.   I,Gordon Zheng,acting as a scribe for Ann Held, DO.,have documented all relevant documentation on the behalf of Ann Held, DO,as  directed by  Ann Held, DO while in the presence of Ann Held, DO.  I, Ann Held, DO, have reviewed all documentation for this visit. The documentation on 12/14/20 for the exam, diagnosis, procedures, and orders are all accurate and complete.

## 2020-12-14 NOTE — Assessment & Plan Note (Signed)
Check xray today con't meloxicam Added muscle relaxer  Consider PT vs ortho/sport med rto prn

## 2020-12-14 NOTE — Assessment & Plan Note (Signed)
Encourage heart healthy diet such as MIND or DASH diet, increase exercise, avoid trans fats, simple carbohydrates and processed foods, consider a krill or fish or flaxseed oil cap daily.  °

## 2020-12-21 ENCOUNTER — Ambulatory Visit (HOSPITAL_COMMUNITY)
Admission: RE | Admit: 2020-12-21 | Discharge: 2020-12-21 | Disposition: A | Discharge status: Home / Self Care | Payer: Medicare HMO | Source: Ambulatory Visit | Attending: Family Medicine | Admitting: Family Medicine

## 2020-12-21 ENCOUNTER — Other Ambulatory Visit: Payer: Self-pay

## 2020-12-21 DIAGNOSIS — G8929 Other chronic pain: Secondary | ICD-10-CM | POA: Diagnosis not present

## 2020-12-21 DIAGNOSIS — M545 Low back pain, unspecified: Secondary | ICD-10-CM | POA: Diagnosis not present

## 2020-12-21 DIAGNOSIS — M5440 Lumbago with sciatica, unspecified side: Secondary | ICD-10-CM | POA: Insufficient documentation

## 2020-12-22 ENCOUNTER — Other Ambulatory Visit: Payer: Self-pay | Admitting: Family Medicine

## 2020-12-22 ENCOUNTER — Telehealth: Payer: Self-pay | Admitting: Family Medicine

## 2020-12-22 DIAGNOSIS — M48061 Spinal stenosis, lumbar region without neurogenic claudication: Secondary | ICD-10-CM

## 2020-12-22 NOTE — Telephone Encounter (Signed)
Patient called in today wanting to know her MRI results from yesterday. She is aware results are just out and knows PCP may not have had a chance to look at them. She was not sure if she would receive a call or not. Informed that she most likely would get a call once Dr. Etter Sjogren has had a chance to look at them.

## 2020-12-23 NOTE — Telephone Encounter (Signed)
Pt viewed results via Mychart

## 2020-12-31 DIAGNOSIS — M48062 Spinal stenosis, lumbar region with neurogenic claudication: Secondary | ICD-10-CM | POA: Diagnosis not present

## 2021-01-13 DIAGNOSIS — H353223 Exudative age-related macular degeneration, left eye, with inactive scar: Secondary | ICD-10-CM | POA: Diagnosis not present

## 2021-01-13 DIAGNOSIS — D3131 Benign neoplasm of right choroid: Secondary | ICD-10-CM | POA: Diagnosis not present

## 2021-01-13 DIAGNOSIS — H43813 Vitreous degeneration, bilateral: Secondary | ICD-10-CM | POA: Diagnosis not present

## 2021-01-13 DIAGNOSIS — H353112 Nonexudative age-related macular degeneration, right eye, intermediate dry stage: Secondary | ICD-10-CM | POA: Diagnosis not present

## 2021-01-26 DIAGNOSIS — M48062 Spinal stenosis, lumbar region with neurogenic claudication: Secondary | ICD-10-CM | POA: Diagnosis not present

## 2021-02-23 ENCOUNTER — Other Ambulatory Visit: Payer: Self-pay | Admitting: Family Medicine

## 2021-02-23 DIAGNOSIS — G4700 Insomnia, unspecified: Secondary | ICD-10-CM

## 2021-02-23 MED ORDER — ZOLPIDEM TARTRATE 5 MG PO TABS: 5.0000 mg | ORAL_TABLET | Freq: Every day | ORAL | 1 refills | Status: DC

## 2021-02-23 NOTE — Telephone Encounter (Signed)
Requesting: Ambien Contract: none UDS: 05/11/2018 Last Visit:12/14/2020 Next Visit:06/15/2021 Last Refill:12/14/2020  Please Advise

## 2021-02-23 NOTE — Addendum Note (Signed)
Addended by: Wynonia Musty A on: 02/23/2021 03:29 PM   Modules accepted: Orders

## 2021-03-08 DIAGNOSIS — N390 Urinary tract infection, site not specified: Secondary | ICD-10-CM | POA: Diagnosis not present

## 2021-03-17 ENCOUNTER — Telehealth: Payer: Self-pay | Admitting: Family Medicine

## 2021-03-17 NOTE — Telephone Encounter (Signed)
Pt states being seen at [facility] and was given Cefpodoxime 10 MG twice daily. Pt states she doesn't feel like med is working. She states still having urgency and retention. Pt is scheduled to come in Monday. Pt states she is not sure if [facility] did a culture.

## 2021-03-17 NOTE — Telephone Encounter (Signed)
Pt called in and stated she was recently seen at [facility] and was prescribed a 6 day antibiotic. Pt stomach is still hurting at the moment and wants to know if there is anything else should could do. We scheduled an appt for Mon 9/19 '@1'$ :20. Please advise.

## 2021-03-19 NOTE — Telephone Encounter (Signed)
I attempted to call [facility] and I couldn't get through. Phone kept ringing.

## 2021-03-22 ENCOUNTER — Ambulatory Visit (INDEPENDENT_AMBULATORY_CARE_PROVIDER_SITE_OTHER): Payer: Medicare HMO | Admitting: Family Medicine

## 2021-03-22 ENCOUNTER — Other Ambulatory Visit: Payer: Self-pay

## 2021-03-22 ENCOUNTER — Encounter: Payer: Self-pay | Admitting: Family Medicine

## 2021-03-22 VITALS — BP 120/70 | HR 75 | Temp 98.3°F | Resp 18 | Ht 64.0 in | Wt 130.8 lb

## 2021-03-22 DIAGNOSIS — R350 Frequency of micturition: Secondary | ICD-10-CM

## 2021-03-22 DIAGNOSIS — N390 Urinary tract infection, site not specified: Secondary | ICD-10-CM

## 2021-03-22 DIAGNOSIS — Z23 Encounter for immunization: Secondary | ICD-10-CM | POA: Diagnosis not present

## 2021-03-22 LAB — POC URINALSYSI DIPSTICK (AUTOMATED)
Bilirubin, UA: NEGATIVE
Glucose, UA: NEGATIVE
Ketones, UA: NEGATIVE
Nitrite, UA: POSITIVE
Protein, UA: NEGATIVE
Spec Grav, UA: 1.015 (ref 1.010–1.025)
Urobilinogen, UA: 0.2 E.U./dL
pH, UA: 6 (ref 5.0–8.0)

## 2021-03-22 MED ORDER — NITROFURANTOIN MONOHYD MACRO 100 MG PO CAPS: 100.0000 mg | ORAL_CAPSULE | Freq: Two times a day (BID) | ORAL | 0 refills | Status: DC

## 2021-03-22 NOTE — Progress Notes (Signed)
Established Patient Office Visit  Subjective:  Patient ID: Joanna Williams, female    DOB: 1940-10-26  Age: 80 y.o. MRN: [id number]  CC:  Chief Complaint  Patient presents with   Urinary Frequency    Pt states having sxs since Labor day. Pt stated having freq and some abdominal  discomfort and fatigue.     HPI Joanna Williams presents for urinary frequency , abd pain and fatigue since labor day.   She went to an uc and was given abx but it never completely went away.  No fever, no back pain  Past Medical History:  Diagnosis Date   GERD (gastroesophageal reflux disease)    Hyperlipidemia    Macular degeneration 05/04/2017    Past Surgical History:  Procedure Laterality Date   COLONOSCOPY  2 yrs ago   EYE SURGERY Bilateral 2014   cataract   TOTAL HIP ARTHROPLASTY  08/10/2011   Procedure: TOTAL HIP ARTHROPLASTY;  Surgeon: Kerin Salen, MD;  Location: [place];  Service: Orthopedics;  Laterality: Right;  DEPUY/ PENNACLE POLY OR CERAMIC    Family History  Problem Relation Age of Onset   Heart disease Mother    Cancer Father 26       brain    Social History   Socioeconomic History   Marital status: Married    Spouse name: Not on file   Number of children: Not on file   Years of education: Not on file   Highest education level: Not on file  Occupational History   Occupation: retired    Fish farm manager: RETIRED  Tobacco Use   Smoking status: Never   Smokeless tobacco: Never  Substance and Sexual Activity   Alcohol use: Yes    Alcohol/week: 1.0 standard drink    Types: 1 Glasses of wine per week   Drug use: Yes    Types: Hydrocodone   Sexual activity: Yes    Partners: Male  Other Topics Concern   Not on file  Social History Narrative   Walking, yoga, pickle ball   Social Determinants of Health   Financial Resource Strain: Not on file  Food Insecurity: Not on file  Transportation Needs: Not on file  Physical Activity: Not on file  Stress: Not on file  Social  Connections: Not on file  Intimate Partner Violence: Not on file    Outpatient Medications Prior to Visit  Medication Sig Dispense Refill   atorvastatin (LIPITOR) 40 MG tablet Take 1 tablet (40 mg total) by mouth daily. 90 tablet 1   augmented betamethasone dipropionate (DIPROLENE-AF) 0.05 % cream APPLY TO AFFECTED AREA TWICE A DAY 60 g 0   denosumab (PROLIA) 60 MG/ML SOLN injection Inject 60 mg into the skin every 6 (six) months. Administer in upper arm, thigh, or abdomen     meloxicam (MOBIC) 7.5 MG tablet Take 1 tablet (7.5 mg total) by mouth 2 (two) times daily as needed for pain. 60 tablet 2   Multiple Vitamins-Minerals (PRESERVISION AREDS 2+MULTI VIT PO) Take 2 tablets by mouth daily.     neomycin-polymyxin b-dexamethasone (MAXITROL) 3.5-10000-0.1 OINT      omeprazole (PRILOSEC) 20 MG capsule Take 1 capsule (20 mg total) by mouth daily. 90 capsule 3   tiZANidine (ZANAFLEX) 4 MG tablet Take 1 tablet (4 mg total) by mouth every 6 (six) hours as needed for muscle spasms. 30 tablet 0   zolpidem (AMBIEN) 5 MG tablet Take 1 tablet (5 mg total) by mouth at bedtime. 90 tablet 1  No facility-administered medications prior to visit.    Allergies  Allergen Reactions   Niacin     REACTION: Nausea   Sulfa Antibiotics     ROS Review of Systems  Constitutional:  Negative for appetite change, diaphoresis, fatigue and unexpected weight change.  Eyes:  Negative for pain, redness and visual disturbance.  Respiratory:  Negative for cough, chest tightness, shortness of breath and wheezing.   Cardiovascular:  Negative for chest pain, palpitations and leg swelling.  Endocrine: Negative for cold intolerance, heat intolerance, polydipsia, polyphagia and polyuria.  Genitourinary:  Positive for dysuria, frequency and urgency. Negative for decreased urine volume, difficulty urinating, hematuria, pelvic pain, vaginal bleeding, vaginal discharge and vaginal pain.  Neurological:  Negative for dizziness,  light-headedness, numbness and headaches.     Objective:    Physical Exam Vitals and nursing note reviewed.  Constitutional:      Appearance: She is well-developed.  HENT:     Head: Normocephalic and atraumatic.  Eyes:     Conjunctiva/sclera: Conjunctivae normal.  Neck:     Thyroid: No thyromegaly.     Vascular: No carotid bruit or JVD.  Cardiovascular:     Rate and Rhythm: Normal rate and regular rhythm.     Heart sounds: Normal heart sounds. No murmur heard. Pulmonary:     Effort: Pulmonary effort is normal. No respiratory distress.     Breath sounds: Normal breath sounds. No wheezing or rales.  Chest:     Chest wall: No tenderness.  Abdominal:     General: Abdomen is flat.     Palpations: Abdomen is soft.     Tenderness: There is no abdominal tenderness. There is no right CVA tenderness, left CVA tenderness, guarding or rebound.  Musculoskeletal:     Cervical back: Normal range of motion and neck supple.  Neurological:     Mental Status: She is alert and oriented to person, place, and time.   BP 120/70 (BP Location: Left Arm, Patient Position: Sitting, Cuff Size: Normal)   Pulse 75   Temp 98.3 F (36.8 C) (Oral)   Resp 18   Ht '5\' 4"'$  (1.626 m)   Wt 130 lb 12.8 oz (59.3 kg)   SpO2 94%   BMI 22.45 kg/m  Wt Readings from Last 3 Encounters:  03/22/21 130 lb 12.8 oz (59.3 kg)  12/14/20 133 lb 12.8 oz (60.7 kg)  06/09/20 135 lb 12.8 oz (61.6 kg)     Health Maintenance Due  Topic Date Due   URINE MICROALBUMIN  03/18/2016   COVID-19 Vaccine (2 - Pfizer risk series) 08/13/2019    There are no preventive care reminders to display for this patient.  Lab Results  Component Value Date   TSH 3.99 12/14/2020   Lab Results  Component Value Date   WBC 6.3 05/04/2017   HGB 14.2 05/04/2017   HCT 42.6 05/04/2017   MCV 95.1 05/04/2017   PLT 241.0 05/04/2017   Lab Results  Component Value Date   NA 142 12/14/2020   K 4.6 12/14/2020   CO2 31 12/14/2020   GLUCOSE  59 (L) 12/14/2020   BUN 26 (H) 12/14/2020   CREATININE 0.74 12/14/2020   BILITOT 0.5 12/14/2020   ALKPHOS 83 12/14/2020   AST 22 12/14/2020   ALT 20 12/14/2020   PROT 6.2 12/14/2020   ALBUMIN 4.2 12/14/2020   CALCIUM 9.2 12/14/2020   GFR 76.12 12/14/2020   Lab Results  Component Value Date   CHOL 161 12/14/2020   Lab Results  Component Value Date   HDL 52.10 12/14/2020   Lab Results  Component Value Date   LDLCALC 85 06/18/2020   Lab Results  Component Value Date   TRIG 202.0 (H) 12/14/2020   Lab Results  Component Value Date   CHOLHDL 3 12/14/2020   Lab Results  Component Value Date   HGBA1C 5.8 08/13/2012      Assessment & Plan:   Problem List Items Addressed This Visit       Unprioritized   UTI (urinary tract infection)    macrobid x 7 days Culture pending       Relevant Medications   nitrofurantoin, macrocrystal-monohydrate, (MACROBID) 100 MG capsule   Other Visit Diagnoses     Urinary frequency    -  Primary   Relevant Orders   POCT Urinalysis Dipstick (Automated) (Completed)   Urine Culture (Completed)   Need for influenza vaccination       Relevant Orders   Flu Vaccine QUAD High Dose(Fluad) (Completed)       Meds ordered this encounter  Medications   nitrofurantoin, macrocrystal-monohydrate, (MACROBID) 100 MG capsule    Sig: Take 1 capsule (100 mg total) by mouth 2 (two) times daily.    Dispense:  14 capsule    Refill:  0    Follow-up: Return in about 2 weeks (around 04/05/2021), or if symptoms worsen or fail to improve.    Ann Held, DO

## 2021-03-22 NOTE — Patient Instructions (Signed)
Urinary Tract Infection, Adult A urinary tract infection (UTI) is an infection of any part of the urinary tract. The urinary tract includes the kidneys, ureters, bladder, and urethra. These organs make, store, and get rid of urine in the body. An upper UTI affects the ureters and kidneys. A lower UTI affects the bladder and urethra. What are the causes? Most urinary tract infections are caused by bacteria in your genital area around your urethra, where urine leaves your body. These bacteria grow and cause inflammation of your urinary tract. What increases the risk? You are more likely to develop this condition if: You have a urinary catheter that stays in place. You are not able to control when you urinate or have a bowel movement (incontinence). You are female and you: Use a spermicide or diaphragm for birth control. Have low estrogen levels. Are pregnant. You have certain genes that increase your risk. You are sexually active. You take antibiotic medicines. You have a condition that causes your flow of urine to slow down, such as: An enlarged prostate, if you are female. Blockage in your urethra. A kidney stone. A nerve condition that affects your bladder control (neurogenic bladder). Not getting enough to drink, or not urinating often. You have certain medical conditions, such as: Diabetes. A weak disease-fighting system (immunesystem). Sickle cell disease. Gout. Spinal cord injury. What are the signs or symptoms? Symptoms of this condition include: Needing to urinate right away (urgency). Frequent urination. This may include small amounts of urine each time you urinate. Pain or burning with urination. Blood in the urine. Urine that smells bad or unusual. Trouble urinating. Cloudy urine. Vaginal discharge, if you are female. Pain in the abdomen or the lower back. You may also have: Vomiting or a decreased appetite. Confusion. Irritability or tiredness. A fever or  chills. Diarrhea. The first symptom in older adults may be confusion. In some cases, they may not have any symptoms until the infection has worsened. How is this diagnosed? This condition is diagnosed based on your medical history and a physical exam. You may also have other tests, including: Urine tests. Blood tests. Tests for STIs (sexually transmitted infections). If you have had more than one UTI, a cystoscopy or imaging studies may be done to determine the cause of the infections. How is this treated? Treatment for this condition includes: Antibiotic medicine. Over-the-counter medicines to treat discomfort. Drinking enough water to stay hydrated. If you have frequent infections or have other conditions such as a kidney stone, you may need to see a health care provider who specializes in the urinary tract (urologist). In rare cases, urinary tract infections can cause sepsis. Sepsis is a life-threatening condition that occurs when the body responds to an infection. Sepsis is treated in the hospital with IV antibiotics, fluids, and other medicines. Follow these instructions at home: Medicines Take over-the-counter and prescription medicines only as told by your health care provider. If you were prescribed an antibiotic medicine, take it as told by your health care provider. Do not stop using the antibiotic even if you start to feel better. General instructions Make sure you: Empty your bladder often and completely. Do not hold urine for long periods of time. Empty your bladder after sex. Wipe from front to back after urinating or having a bowel movement if you are female. Use each tissue only one time when you wipe. Drink enough fluid to keep your urine pale yellow. Keep all follow-up visits. This is important. Contact a health care provider   if: Your symptoms do not get better after 1-2 days. Your symptoms go away and then return. Get help right away if: You have severe pain in your  back or your lower abdomen. You have a fever or chills. You have nausea or vomiting. Summary A urinary tract infection (UTI) is an infection of any part of the urinary tract, which includes the kidneys, ureters, bladder, and urethra. Most urinary tract infections are caused by bacteria in your genital area. Treatment for this condition often includes antibiotic medicines. If you were prescribed an antibiotic medicine, take it as told by your health care provider. Do not stop using the antibiotic even if you start to feel better. Keep all follow-up visits. This is important. This information is not intended to replace advice given to you by your health care provider. Make sure you discuss any questions you have with your health care provider. Document Revised: 01/31/2020 Document Reviewed: 01/31/2020 Elsevier Patient Education  2022 Elsevier Inc.  

## 2021-03-25 LAB — URINE CULTURE
MICRO NUMBER:: 12391418
SPECIMEN QUALITY:: ADEQUATE

## 2021-03-29 NOTE — Assessment & Plan Note (Signed)
macrobid x 7 days Culture pending

## 2021-06-15 ENCOUNTER — Encounter: Payer: Medicare HMO | Admitting: Family Medicine

## 2021-07-13 ENCOUNTER — Ambulatory Visit (INDEPENDENT_AMBULATORY_CARE_PROVIDER_SITE_OTHER): Payer: Medicare HMO | Admitting: Family Medicine

## 2021-07-13 ENCOUNTER — Other Ambulatory Visit: Payer: Self-pay | Admitting: Family Medicine

## 2021-07-13 ENCOUNTER — Encounter: Payer: Self-pay | Admitting: Family Medicine

## 2021-07-13 VITALS — BP 124/60 | HR 87 | Temp 97.7°F | Resp 18 | Ht 64.0 in | Wt 135.2 lb

## 2021-07-13 DIAGNOSIS — G5602 Carpal tunnel syndrome, left upper limb: Secondary | ICD-10-CM | POA: Diagnosis not present

## 2021-07-13 DIAGNOSIS — E039 Hypothyroidism, unspecified: Secondary | ICD-10-CM | POA: Diagnosis not present

## 2021-07-13 DIAGNOSIS — Z0000 Encounter for general adult medical examination without abnormal findings: Secondary | ICD-10-CM

## 2021-07-13 DIAGNOSIS — E785 Hyperlipidemia, unspecified: Secondary | ICD-10-CM | POA: Diagnosis not present

## 2021-07-13 DIAGNOSIS — G4700 Insomnia, unspecified: Secondary | ICD-10-CM | POA: Diagnosis not present

## 2021-07-13 DIAGNOSIS — M159 Polyosteoarthritis, unspecified: Secondary | ICD-10-CM

## 2021-07-13 DIAGNOSIS — K219 Gastro-esophageal reflux disease without esophagitis: Secondary | ICD-10-CM | POA: Diagnosis not present

## 2021-07-13 DIAGNOSIS — M810 Age-related osteoporosis without current pathological fracture: Secondary | ICD-10-CM

## 2021-07-13 MED ORDER — ZOLPIDEM TARTRATE 5 MG PO TABS: 5.0000 mg | ORAL_TABLET | Freq: Every day | ORAL | 1 refills | Status: DC

## 2021-07-13 MED ORDER — DICLOFENAC SODIUM 2 % EX SOLN: CUTANEOUS | 1 refills | Status: DC

## 2021-07-13 MED ORDER — CELECOXIB 200 MG PO CAPS: 200.0000 mg | ORAL_CAPSULE | Freq: Every day | ORAL | 1 refills | Status: DC

## 2021-07-13 MED ORDER — OMEPRAZOLE 20 MG PO CPDR: 20.0000 mg | DELAYED_RELEASE_CAPSULE | Freq: Every day | ORAL | 3 refills | Status: DC

## 2021-07-13 MED ORDER — ATORVASTATIN CALCIUM 40 MG PO TABS: 40.0000 mg | ORAL_TABLET | Freq: Every day | ORAL | 1 refills | Status: DC

## 2021-07-13 NOTE — Patient Instructions (Signed)
Preventive Care 65 Years and Older, Female °Preventive care refers to lifestyle choices and visits with your health care provider that can promote health and wellness. Preventive care visits are also called wellness exams. °What can I expect for my preventive care visit? °Counseling °Your health care provider may ask you questions about your: °Medical history, including: °Past medical problems. °Family medical history. °Pregnancy and menstrual history. °History of falls. °Current health, including: °Memory and ability to understand (cognition). °Emotional well-being. °Home life and relationship well-being. °Sexual activity and sexual health. °Lifestyle, including: °Alcohol, nicotine or tobacco, and drug use. °Access to firearms. °Diet, exercise, and sleep habits. °Work and work environment. °Sunscreen use. °Safety issues such as seatbelt and bike helmet use. °Physical exam °Your health care provider will check your: °Height and weight. These may be used to calculate your BMI (body mass index). BMI is a measurement that tells if you are at a healthy weight. °Waist circumference. This measures the distance around your waistline. This measurement also tells if you are at a healthy weight and may help predict your risk of certain diseases, such as type 2 diabetes and high blood pressure. °Heart rate and blood pressure. °Body temperature. °Skin for abnormal spots. °What immunizations do I need? °Vaccines are usually given at various ages, according to a schedule. Your health care provider will recommend vaccines for you based on your age, medical history, and lifestyle or other factors, such as travel or where you work. °What tests do I need? °Screening °Your health care provider may recommend screening tests for certain conditions. This may include: °Lipid and cholesterol levels. °Hepatitis C test. °Hepatitis B test. °HIV (human immunodeficiency virus) test. °STI (sexually transmitted infection) testing, if you are at  risk. °Lung cancer screening. °Colorectal cancer screening. °Diabetes screening. This is done by checking your blood sugar (glucose) after you have not eaten for a while (fasting). °Mammogram. Talk with your health care provider about how often you should have regular mammograms. °BRCA-related cancer screening. This may be done if you have a family history of breast, ovarian, tubal, or peritoneal cancers. °Bone density scan. This is done to screen for osteoporosis. °Talk with your health care provider about your test results, treatment options, and if necessary, the need for more tests. °Follow these instructions at home: °Eating and drinking ° °Eat a diet that includes fresh fruits and vegetables, whole grains, lean protein, and low-fat dairy products. Limit your intake of foods with high amounts of sugar, saturated fats, and salt. °Take vitamin and mineral supplements as recommended by your health care provider. °Do not drink alcohol if your health care provider tells you not to drink. °If you drink alcohol: °Limit how much you have to 0-1 drink a day. °Know how much alcohol is in your drink. In the [place] one drink equals one 12 oz bottle of beer (355 mL), one 5 oz glass of wine (148 mL), or one 1½ oz glass of hard liquor (44 mL). °Lifestyle °Brush your teeth every morning and night with fluoride toothpaste. Floss one time each day. °Exercise for at least 30 minutes 5 or more days each week. °Do not use any products that contain nicotine or tobacco. These products include cigarettes, chewing tobacco, and vaping devices, such as e-cigarettes. If you need help quitting, ask your health care provider. °Do not use drugs. °If you are sexually active, practice safe sex. Use a condom or other form of protection in order to prevent STIs. °Take aspirin only as told by your   health care provider. Make sure that you understand how much to take and what form to take. Work with your health care provider to find out whether it  is safe and beneficial for you to take aspirin daily. Ask your health care provider if you need to take a cholesterol-lowering medicine (statin). Find healthy ways to manage stress, such as: Meditation, yoga, or listening to music. Journaling. Talking to a trusted person. Spending time with friends and family. Minimize exposure to UV radiation to reduce your risk of skin cancer. Safety Always wear your seat belt while driving or riding in a vehicle. Do not drive: If you have been drinking alcohol. Do not ride with someone who has been drinking. When you are tired or distracted. While texting. If you have been using any mind-altering substances or drugs. Wear a helmet and other protective equipment during sports activities. If you have firearms in your house, make sure you follow all gun safety procedures. What's next? Visit your health care provider once a year for an annual wellness visit. Ask your health care provider how often you should have your eyes and teeth checked. Stay up to date on all vaccines. This information is not intended to replace advice given to you by your health care provider. Make sure you discuss any questions you have with your health care provider. Document Revised: 12/16/2020 Document Reviewed: 12/16/2020 Elsevier Patient Education  [place].

## 2021-07-13 NOTE — Telephone Encounter (Signed)
Did you mean to send in the solution?  Patient has been on gel.

## 2021-07-13 NOTE — Progress Notes (Signed)
Subjective:   By signing my name below, I, Shehryar Baig, attest that this documentation has been prepared under the direction and in the presence of Dr. Roma Schanz, DO. 07/13/2020    Patient ID: Joanna Williams, female    DOB: 10-30-1940, 81 y.o.   MRN: [id number]  Chief Complaint  Patient presents with   Annual Exam    Pt states not fasting     HPI Patient is in today for a comprehensive physical exam.  She continues having back pain from sciatica. The pain is radiating down her legs to both her knees. She continues seeing a neurologist regularly and is receiving injections. She reports the injections are improving her pain but she had to schedule her appointment later than usual. She is interested in taking prescription Voltaren gel to manage her back pain. She is taking ibuprofen to manage her back pain at this time. She notes that gabapentin and meloxicam did not improve her pain. She is interested in trying Celebrex to manage her pain.  She did not attend her last appointment for a prolia injection and is interested in resuming her injections.  She continues taking 5 mg Ambien daily PO, 40 mg Lipitor daily PO, 20 mg omeprazole daily PO and reports no new issues while taking it.  She denies having any fever, new moles, congestion, sore throat, new muscle pain, new joint pain, chest pain, cough, SOB, wheezing, n/v/d, constipation, blood in stool, dysuria, frequency, hematuria, or headaches at this time. She has no changes to her family medical history.  She is completing lab work during this appointment and reports last eating 1 hour ago.  She is not participating in exercise at this time due to her sciatica pain. She plans to participate in regular exercise when her pain improves.  She has an upcomming appointment for dental care. She is UTD on vision care.    Past Medical History:  Diagnosis Date   GERD (gastroesophageal reflux disease)    Hyperlipidemia    Macular  degeneration 05/04/2017    Past Surgical History:  Procedure Laterality Date   COLONOSCOPY  2 yrs ago   EYE SURGERY Bilateral 2014   cataract   TOTAL HIP ARTHROPLASTY  08/10/2011   Procedure: TOTAL HIP ARTHROPLASTY;  Surgeon: Kerin Salen, MD;  Location: [facility];  Service: Orthopedics;  Laterality: Right;  DEPUY/ PENNACLE POLY OR CERAMIC    Family History  Problem Relation Age of Onset   Heart disease Mother    Cancer Father 3       brain    Social History   Socioeconomic History   Marital status: Married    Spouse name: Not on file   Number of children: Not on file   Years of education: Not on file   Highest education level: Not on file  Occupational History   Occupation: retired    Fish farm manager: RETIRED  Tobacco Use   Smoking status: Never   Smokeless tobacco: Never  Substance and Sexual Activity   Alcohol use: Yes    Alcohol/week: 1.0 standard drink    Types: 1 Glasses of wine per week   Drug use: Yes    Types: Hydrocodone   Sexual activity: Yes    Partners: Male  Other Topics Concern   Not on file  Social History Narrative   Walking, yoga, pickle ball   Social Determinants of Health   Financial Resource Strain: Not on file  Food Insecurity: Not on file  Transportation Needs:  Not on file  Physical Activity: Not on file  Stress: Not on file  Social Connections: Not on file  Intimate Partner Violence: Not on file    Outpatient Medications Prior to Visit  Medication Sig Dispense Refill   denosumab (PROLIA) 60 MG/ML SOLN injection Inject 60 mg into the skin every 6 (six) months. Administer in upper arm, thigh, or abdomen     Multiple Vitamins-Minerals (PRESERVISION AREDS 2+MULTI VIT PO) Take 2 tablets by mouth daily.     atorvastatin (LIPITOR) 40 MG tablet Take 1 tablet (40 mg total) by mouth daily. 90 tablet 1   augmented betamethasone dipropionate (DIPROLENE-AF) 0.05 % cream APPLY TO AFFECTED AREA TWICE A DAY 60 g 0   meloxicam (MOBIC) 7.5 MG tablet Take 1  tablet (7.5 mg total) by mouth 2 (two) times daily as needed for pain. 60 tablet 2   neomycin-polymyxin b-dexamethasone (MAXITROL) 3.5-10000-0.1 OINT      nitrofurantoin, macrocrystal-monohydrate, (MACROBID) 100 MG capsule Take 1 capsule (100 mg total) by mouth 2 (two) times daily. 14 capsule 0   omeprazole (PRILOSEC) 20 MG capsule Take 1 capsule (20 mg total) by mouth daily. 90 capsule 3   tiZANidine (ZANAFLEX) 4 MG tablet Take 1 tablet (4 mg total) by mouth every 6 (six) hours as needed for muscle spasms. 30 tablet 0   zolpidem (AMBIEN) 5 MG tablet Take 1 tablet (5 mg total) by mouth at bedtime. 90 tablet 1   No facility-administered medications prior to visit.    Allergies  Allergen Reactions   Niacin     REACTION: Nausea    Review of Systems  Constitutional:  Negative for fever.  HENT:  Negative for congestion and sore throat.   Respiratory:  Negative for cough, shortness of breath and wheezing.   Cardiovascular:  Negative for chest pain.  Gastrointestinal:  Negative for blood in stool, constipation, diarrhea, nausea and vomiting.  Genitourinary:  Negative for dysuria, frequency and hematuria.  Musculoskeletal:  Positive for back pain. Negative for myalgias.  Skin:        (-)New moles  Neurological:  Negative for headaches.      Objective:    Physical Exam Constitutional:      General: She is not in acute distress.    Appearance: Normal appearance. She is not ill-appearing.  HENT:     Head: Normocephalic and atraumatic.     Right Ear: Tympanic membrane, ear canal and external ear normal.     Left Ear: Tympanic membrane, ear canal and external ear normal.  Eyes:     Extraocular Movements: Extraocular movements intact.     Pupils: Pupils are equal, round, and reactive to light.  Cardiovascular:     Rate and Rhythm: Normal rate and regular rhythm.     Heart sounds: Normal heart sounds. No murmur heard.   No gallop.  Pulmonary:     Effort: Pulmonary effort is normal. No  respiratory distress.     Breath sounds: Normal breath sounds. No wheezing or rales.  Abdominal:     General: Bowel sounds are normal. There is no distension.     Palpations: Abdomen is soft.     Tenderness: There is no abdominal tenderness. There is no guarding.  Skin:    General: Skin is warm and dry.  Neurological:     Mental Status: She is alert and oriented to person, place, and time.  Psychiatric:        Behavior: Behavior normal.    BP 124/60 (BP  Location: Left Arm, Patient Position: Sitting, Cuff Size: Normal)    Pulse 87    Temp 97.7 F (36.5 C) (Oral)    Resp 18    Ht 5\' 4"  (1.626 m)    Wt 135 lb 3.2 oz (61.3 kg)    SpO2 97%    BMI 23.21 kg/m  Wt Readings from Last 3 Encounters:  07/13/21 135 lb 3.2 oz (61.3 kg)  03/22/21 130 lb 12.8 oz (59.3 kg)  12/14/20 133 lb 12.8 oz (60.7 kg)    Diabetic Foot Exam - Simple   No data filed    Lab Results  Component Value Date   WBC 5.7 07/15/2021   HGB 13.6 07/15/2021   HCT 41.2 07/15/2021   PLT 235.0 07/15/2021   GLUCOSE 79 07/15/2021   CHOL 179 07/15/2021   TRIG 144.0 07/15/2021   HDL 65.20 07/15/2021   LDLDIRECT 84.0 12/14/2020   LDLCALC 85 07/15/2021   ALT 20 07/15/2021   AST 23 07/15/2021   NA 141 07/15/2021   K 4.5 07/15/2021   CL 102 07/15/2021   CREATININE 0.71 07/15/2021   BUN 16 07/15/2021   CO2 34 (H) 07/15/2021   TSH 3.99 12/14/2020   INR 1.50 (H) 08/13/2011   HGBA1C 5.8 08/13/2012   MICROALBUR 2.8 (H) 07/15/2021    Lab Results  Component Value Date   TSH 3.99 12/14/2020   Lab Results  Component Value Date   WBC 5.7 07/15/2021   HGB 13.6 07/15/2021   HCT 41.2 07/15/2021   MCV 96.3 07/15/2021   PLT 235.0 07/15/2021   Lab Results  Component Value Date   NA 141 07/15/2021   K 4.5 07/15/2021   CO2 34 (H) 07/15/2021   GLUCOSE 79 07/15/2021   BUN 16 07/15/2021   CREATININE 0.71 07/15/2021   BILITOT 0.4 07/15/2021   ALKPHOS 80 07/15/2021   AST 23 07/15/2021   ALT 20 07/15/2021   PROT 6.1  07/15/2021   ALBUMIN 4.2 07/15/2021   CALCIUM 9.4 07/15/2021   GFR 79.66 07/15/2021   Lab Results  Component Value Date   CHOL 179 07/15/2021   Lab Results  Component Value Date   HDL 65.20 07/15/2021   Lab Results  Component Value Date   LDLCALC 85 07/15/2021   Lab Results  Component Value Date   TRIG 144.0 07/15/2021   Lab Results  Component Value Date   CHOLHDL 3 07/15/2021   Lab Results  Component Value Date   HGBA1C 5.8 08/13/2012   Colonoscopy- Last completed 09/02/2008.  Mammogram- Last completed 09/19/2020. Results are normal. Repeat in 1 year.  Dexa- Last completed 06/20/2019. Results showed she is osteoporotic. Repeat in 2 years.      Assessment & Plan:   Problem List Items Addressed This Visit       Unprioritized   GERD (gastroesophageal reflux disease)   Relevant Medications   omeprazole (PRILOSEC) 20 MG capsule   Age-related osteoporosis without current pathological fracture    On prolia       Hyperlipidemia LDL goal <100    Encourage heart healthy diet such as MIND or DASH diet, increase exercise, avoid trans fats, simple carbohydrates and processed foods, consider a krill or fish or flaxseed oil cap daily.       Relevant Medications   atorvastatin (LIPITOR) 40 MG tablet   Hypothyroidism    Check labs       Relevant Orders   TSH   Microalbumin / creatinine urine ratio (Completed)  Preventative health care - Primary    ghm utd Check labs   ACP info given to pt       Other Visit Diagnoses     Hyperlipidemia, unspecified hyperlipidemia type       Relevant Medications   atorvastatin (LIPITOR) 40 MG tablet   Other Relevant Orders   CBC with Differential/Platelet (Completed)   Comprehensive metabolic panel (Completed)   Lipid panel (Completed)   Microalbumin / creatinine urine ratio (Completed)   Insomnia, unspecified type       Relevant Medications   zolpidem (AMBIEN) 5 MG tablet   Osteoarthritis of multiple joints,  unspecified osteoarthritis type       Relevant Medications   Diclofenac Sodium (PENNSAID) 2 % SOLN   celecoxib (CELEBREX) 200 MG capsule   Carpal tunnel syndrome of left wrist       Relevant Medications   zolpidem (AMBIEN) 5 MG tablet   Other Relevant Orders   Ambulatory referral to Orthopedic Surgery        Meds ordered this encounter  Medications   atorvastatin (LIPITOR) 40 MG tablet    Sig: Take 1 tablet (40 mg total) by mouth daily.    Dispense:  90 tablet    Refill:  1   omeprazole (PRILOSEC) 20 MG capsule    Sig: Take 1 capsule (20 mg total) by mouth daily.    Dispense:  90 capsule    Refill:  3   zolpidem (AMBIEN) 5 MG tablet    Sig: Take 1 tablet (5 mg total) by mouth at bedtime.    Dispense:  90 tablet    Refill:  1    Not to exceed 5 additional fills before 06/12/2021   Diclofenac Sodium (PENNSAID) 2 % SOLN    Sig: 2 pumps per knee bid    Dispense:  112 g    Refill:  1   celecoxib (CELEBREX) 200 MG capsule    Sig: Take 1 capsule (200 mg total) by mouth daily.    Dispense:  90 capsule    Refill:  1    I, Dr. Roma Schanz, DO, personally preformed the services described in this documentation.  All medical record entries made by the scribe were at my direction and in my presence.  I have reviewed the chart and discharge instructions (if applicable) and agree that the record reflects my personal performance and is accurate and complete. 07/13/2021   I,Shehryar Baig,acting as a scribe for Ann Held, DO.,have documented all relevant documentation on the behalf of Ann Held, DO,as directed by  Ann Held, DO while in the presence of Ann Held, DO.   Ann Held, DO

## 2021-07-14 DIAGNOSIS — H353223 Exudative age-related macular degeneration, left eye, with inactive scar: Secondary | ICD-10-CM | POA: Diagnosis not present

## 2021-07-14 DIAGNOSIS — D3131 Benign neoplasm of right choroid: Secondary | ICD-10-CM | POA: Diagnosis not present

## 2021-07-14 DIAGNOSIS — H353113 Nonexudative age-related macular degeneration, right eye, advanced atrophic without subfoveal involvement: Secondary | ICD-10-CM | POA: Diagnosis not present

## 2021-07-14 DIAGNOSIS — H43813 Vitreous degeneration, bilateral: Secondary | ICD-10-CM | POA: Diagnosis not present

## 2021-07-15 ENCOUNTER — Telehealth: Payer: Self-pay | Admitting: *Deleted

## 2021-07-15 ENCOUNTER — Other Ambulatory Visit (INDEPENDENT_AMBULATORY_CARE_PROVIDER_SITE_OTHER): Payer: Medicare HMO

## 2021-07-15 DIAGNOSIS — E039 Hypothyroidism, unspecified: Secondary | ICD-10-CM

## 2021-07-15 DIAGNOSIS — E785 Hyperlipidemia, unspecified: Secondary | ICD-10-CM

## 2021-07-15 LAB — COMPREHENSIVE METABOLIC PANEL
ALT: 20 U/L (ref 0–35)
AST: 23 U/L (ref 0–37)
Albumin: 4.2 g/dL (ref 3.5–5.2)
Alkaline Phosphatase: 80 U/L (ref 39–117)
BUN: 16 mg/dL (ref 6–23)
CO2: 34 mEq/L — ABNORMAL HIGH (ref 19–32)
Calcium: 9.4 mg/dL (ref 8.4–10.5)
Chloride: 102 mEq/L (ref 96–112)
Creatinine, Ser: 0.71 mg/dL (ref 0.40–1.20)
GFR: 79.66 mL/min (ref >60.00)
Glucose, Bld: 79 mg/dL (ref 70–99)
Potassium: 4.5 mEq/L (ref 3.5–5.1)
Sodium: 141 mEq/L (ref 135–145)
Total Bilirubin: 0.4 mg/dL (ref 0.2–1.2)
Total Protein: 6.1 g/dL (ref 6.0–8.3)

## 2021-07-15 LAB — CBC WITH DIFFERENTIAL/PLATELET
Basophils Absolute: 0.1 10*3/uL (ref 0.0–0.1)
Basophils Relative: 0.9 % (ref 0.0–3.0)
Eosinophils Absolute: 0.2 10*3/uL (ref 0.0–0.7)
Eosinophils Relative: 4.1 % (ref 0.0–5.0)
HCT: 41.2 % (ref 36.0–46.0)
Hemoglobin: 13.6 g/dL (ref 12.0–15.0)
Lymphocytes Relative: 38.7 % (ref 12.0–46.0)
Lymphs Abs: 2.2 10*3/uL (ref 0.7–4.0)
MCHC: 33.1 g/dL (ref 30.0–36.0)
MCV: 96.3 fl (ref 78.0–100.0)
Monocytes Absolute: 0.6 10*3/uL (ref 0.1–1.0)
Monocytes Relative: 9.7 % (ref 3.0–12.0)
Neutro Abs: 2.7 10*3/uL (ref 1.4–7.7)
Neutrophils Relative %: 46.6 % (ref 43.0–77.0)
Platelets: 235 10*3/uL (ref 150.0–400.0)
RBC: 4.28 Mil/uL (ref 3.87–5.11)
RDW: 13.3 % (ref 11.5–15.5)
WBC: 5.7 10*3/uL (ref 4.0–10.5)

## 2021-07-15 LAB — MICROALBUMIN / CREATININE URINE RATIO
Creatinine,U: 93.3 mg/dL
Microalb Creat Ratio: 3 mg/g (ref 0.0–30.0)
Microalb, Ur: 2.8 mg/dL — ABNORMAL HIGH (ref 0.0–1.9)

## 2021-07-15 LAB — LIPID PANEL
Cholesterol: 179 mg/dL (ref 0–200)
HDL: 65.2 mg/dL (ref >39.00)
LDL Cholesterol: 85 mg/dL (ref 0–99)
NonHDL: 113.58
Total CHOL/HDL Ratio: 3
Triglycerides: 144 mg/dL (ref 0.0–149.0)
VLDL: 28.8 mg/dL (ref 0.0–40.0)

## 2021-07-15 NOTE — Telephone Encounter (Signed)
Insurance denied the diclofenac solution.  You asked for the drug above for your unspecified polyosteoarthritis of other multiple joints. This is an off-label use that is not medically accepted. The Medicare rule in the Prescription Drug Benefit Manual (Chapter 6, Section 10.6) says a drug must be used for a medically accepted indication (covered use). Off-label use is medically accepted when there is proof in one or more of the drug guides that the drug works for your condition. We look at the two major drug guides (compendia): the [place] and the [place] (AHFS-DI). [employer] has decided that there is no proof in either drug guide that this drug works for your condition. Per Medicare rules, it is not covered.

## 2021-07-15 NOTE — Telephone Encounter (Signed)
Prior auth started via cover my meds.  Awaiting determination.  Key: [id number]

## 2021-07-15 NOTE — Telephone Encounter (Signed)
PRIOR AUTH STARTED

## 2021-07-16 NOTE — Telephone Encounter (Signed)
Lvm to call back

## 2021-07-18 DIAGNOSIS — Z0000 Encounter for general adult medical examination without abnormal findings: Secondary | ICD-10-CM | POA: Insufficient documentation

## 2021-07-18 NOTE — Assessment & Plan Note (Signed)
ghm utd Check labs   ACP info given to pt

## 2021-07-18 NOTE — Assessment & Plan Note (Signed)
Check labs 

## 2021-07-18 NOTE — Assessment & Plan Note (Signed)
On prolia

## 2021-07-18 NOTE — Assessment & Plan Note (Signed)
Encourage heart healthy diet such as MIND or DASH diet, increase exercise, avoid trans fats, simple carbohydrates and processed foods, consider a krill or fish or flaxseed oil cap daily.  °

## 2021-07-21 NOTE — Telephone Encounter (Signed)
Tried calling twice but number was busy.

## 2021-07-30 DIAGNOSIS — M48062 Spinal stenosis, lumbar region with neurogenic claudication: Secondary | ICD-10-CM | POA: Diagnosis not present

## 2021-08-18 ENCOUNTER — Telehealth: Payer: Self-pay | Admitting: Family Medicine

## 2021-08-18 NOTE — Telephone Encounter (Signed)
Left message for patient to call back and schedule Medicare Annual Wellness Visit (AWV) in office.   If not able to come in office, please offer to do virtually or by telephone.  Left office number and my jabber [phone].  Last AWV:03/19/2015  Please schedule at anytime with Nurse Health Advisor.

## 2021-08-31 DIAGNOSIS — M48062 Spinal stenosis, lumbar region with neurogenic claudication: Secondary | ICD-10-CM | POA: Diagnosis not present

## 2021-09-10 NOTE — Progress Notes (Signed)
Subjective:   Joanna Williams is a 81 y.o. female who presents for an Initial Medicare Annual Wellness Visit.  I connected with  Joanna Williams on 09/13/21 by a audio enabled telemedicine application and verified that I am speaking with the correct person using two identifiers.  Patient Location: Home  Provider Location: Office/Clinic  I discussed the limitations of evaluation and management by telemedicine. The patient expressed understanding and agreed to proceed.   Review of Systems     Cardiac Risk Factors include: advanced age (>84mn, >>96women);dyslipidemia     Objective:    There were no vitals filed for this visit. There is no height or weight on file to calculate BMI.  Advanced Directives 09/13/2021 08/10/2011 08/03/2011  Does Patient Have a Medical Advance Directive? Yes - Patient has advance directive, copy not in chart  Type of Advance Directive [place] (pink MOST or yellow form);Living will - Living will  Copy of [place]? No - copy requested - Copy requested from family  Pre-existing out of facility DNR order (yellow form or pink MOST form) - No -    Current Medications (verified) Outpatient Encounter Medications as of 09/13/2021  Medication Sig   atorvastatin (LIPITOR) 40 MG tablet Take 1 tablet (40 mg total) by mouth daily.   celecoxib (CELEBREX) 200 MG capsule Take 1 capsule (200 mg total) by mouth daily.   denosumab (PROLIA) 60 MG/ML SOLN injection Inject 60 mg into the skin every 6 (six) months. Administer in upper arm, thigh, or abdomen   Diclofenac Sodium (PENNSAID) 2 % SOLN 2 pumps per knee bid   Multiple Vitamins-Minerals (PRESERVISION AREDS 2+MULTI VIT PO) Take 2 tablets by mouth daily.   omeprazole (PRILOSEC) 20 MG capsule Take 1 capsule (20 mg total) by mouth daily.   zolpidem (AMBIEN) 5 MG tablet Take 1 tablet (5 mg total) by mouth at bedtime.   No facility-administered  encounter medications on file as of 09/13/2021.    Allergies (verified) Niacin   History: Past Medical History:  Diagnosis Date   GERD (gastroesophageal reflux disease)    Hyperlipidemia    Macular degeneration 05/04/2017   Past Surgical History:  Procedure Laterality Date   COLONOSCOPY  2 yrs ago   EYE SURGERY Bilateral 2014   cataract   TOTAL HIP ARTHROPLASTY  08/10/2011   Procedure: TOTAL HIP ARTHROPLASTY;  Surgeon: FKerin Salen MD;  Location: [facility]  Service: Orthopedics;  Laterality: Right;  DEPUY/ PENNACLE POLY OR CERAMIC   Family History  Problem Relation Age of Onset   Heart disease Mother    Cancer Father 771      brain   Social History   Socioeconomic History   Marital status: Married    Spouse name: Not on file   Number of children: Not on file   Years of education: Not on file   Highest education level: Not on file  Occupational History   Occupation: retired    EFish farm manager RETIRED  Tobacco Use   Smoking status: Never   Smokeless tobacco: Never  Substance and Sexual Activity   Alcohol use: Yes    Alcohol/week: 1.0 standard drink    Types: 1 Glasses of wine per week   Drug use: Yes    Types: Hydrocodone   Sexual activity: Yes    Partners: Male  Other Topics Concern   Not on file  Social History Narrative   Walking, yoga, pickle  ball   Social Determinants of Health   Financial Resource Strain: Low Risk    Difficulty of Paying Living Expenses: Not hard at all  Food Insecurity: No Food Insecurity   Worried About Charity fundraiser in the Last Year: Never true   Arboriculturist in the Last Year: Never true  Transportation Needs: No Transportation Needs   Lack of Transportation (Medical): No   Lack of Transportation (Non-Medical): No  Physical Activity: Sufficiently Active   Days of Exercise per Week: 7 days   Minutes of Exercise per Session: 60 min  Stress: No Stress Concern Present   Feeling of Stress : Not at all  Social Connections:  Moderately Integrated   Frequency of Communication with Friends and Family: More than three times a week   Frequency of Social Gatherings with Friends and Family: More than three times a week   Attends Religious Services: More than 4 times per year   Active Member of [employer] or Organizations: No   Attends Music therapist: Never   Marital Status: Married    Tobacco Counseling Counseling given: Not Answered   Clinical Intake:  Pre-visit preparation completed: Yes  Pain : No/denies pain     Nutritional Risks: None Diabetes: No  How often do you need to have someone help you when you read instructions, pamphlets, or other written materials from your doctor or pharmacy?: 1 - Never  Diabetic?No  Interpreter Needed?: No  Information entered by :: [place] of Daily Living In your present state of health, do you have any difficulty performing the following activities: 09/13/2021 12/14/2020  Hearing? N N  Vision? N N  Difficulty concentrating or making decisions? N N  Walking or climbing stairs? N Y  Dressing or bathing? N N  Doing errands, shopping? N N  Preparing Food and eating ? N -  Using the Toilet? N -  In the past six months, have you accidently leaked urine? N -  Do you have problems with loss of bowel control? N -  Managing your Medications? N -  Managing your Finances? N -  Housekeeping or managing your Housekeeping? N -  Some recent data might be hidden    Patient Care Team: Carollee Herter, Alferd Apa, DO as PCP - General Iona Beard, OD as Referring Physician (Optometry) Iona Beard, [place] as Referring Physician (Optometry) Sherlynn Stalls, MD as Consulting Physician (Ophthalmology)  Indicate any recent Medical Services you may have received from other than [facility] providers in the past year (date may be approximate).     Assessment:   This is a routine wellness examination for Joanna Williams.  Hearing/Vision screen No results  found.  Dietary issues and exercise activities discussed: Current Exercise Habits: Home exercise routine, Type of exercise: walking, Time (Minutes): 60, Frequency (Times/Week): 7, Weekly Exercise (Minutes/Week): 420, Intensity: Mild, Exercise limited by: None identified   Goals Addressed   None    Depression Screen PHQ 2/9 Scores 09/13/2021 12/14/2020 06/09/2020 03/28/2019 07/15/2016 05/03/2016 05/03/2016  PHQ - 2 Score 0 0 0 0 0 0 0    Fall Risk Fall Risk  09/13/2021 12/14/2020 06/09/2020 03/28/2019 07/15/2016  Falls in the past year? 0 0 0 0 No  Number falls in past yr: 0 0 0 - -  Injury with Fall? 0 0 0 - -  Risk for fall due to : No Fall Risks - - - -  Follow up - Falls evaluation completed Falls evaluation completed  Falls evaluation completed -    FALL RISK PREVENTION PERTAINING TO THE HOME:  Any stairs in or around the home? Yes  If so, are there any without handrails? No  Home free of loose throw rugs in walkways, pet beds, electrical cords, etc? Yes  Adequate lighting in your home to reduce risk of falls? Yes   ASSISTIVE DEVICES UTILIZED TO PREVENT FALLS:  Life alert? No  Use of a cane, walker or w/c? No  Grab bars in the bathroom? Yes  Shower chair or bench in shower? Yes  Elevated toilet seat or a handicapped toilet? Yes   TIMED UP AND GO:  Was the test performed? No .    Cognitive Function: MMSE - Mini Mental State Exam 06/09/2020 05/03/2016  Orientation to time 5 5  Orientation to Place 5 5  Registration 3 3  Attention/ Calculation 5 5  Recall 3 3  Language- name 2 objects 2 2  Language- repeat 1 1  Language- follow 3 step command 3 3  Language- read & follow direction 1 1  Write a sentence 1 1  Copy design 1 1  Total score 30 30     6CIT Screen 09/13/2021  What Year? 0 points  What month? 0 points  What time? 0 points  Count back from 20 0 points  Months in reverse 4 points  Repeat phrase 6 points  Total Score 10    Immunizations Immunization  History  Administered Date(s) Administered   Fluad Quad(high Dose 65+) 03/22/2021   Influenza Split 04/18/2011   Influenza Whole 04/18/2007, 04/15/2008   Influenza, High Dose Seasonal PF 03/19/2015, 05/03/2016, 05/11/2016, 04/10/2017, 05/10/2018, 02/15/2019   Influenza-Unspecified 05/04/2014, 04/10/2017   PFIZER(Purple Top)SARS-COV-2 Vaccination 07/23/2019   Pneumococcal Conjugate-13 03/19/2015   Pneumococcal Polysaccharide-23 09/02/2013   Tdap 03/28/2019   Zoster Recombinat (Shingrix) 12/30/2019, 03/11/2020   Zoster, Live 06/25/2008    TDAP status: Up to date  Flu Vaccine status: Up to date  Pneumococcal vaccine status: Up to date  Covid-19 vaccine status: Information provided on how to obtain vaccines.   Qualifies for Shingles Vaccine? Yes   Zostavax completed No   Shingrix Completed?: Yes  Screening Tests Health Maintenance  Topic Date Due   COVID-19 Vaccine (2 - Pfizer series) 08/13/2019   MAMMOGRAM  09/19/2021   URINE MICROALBUMIN  07/15/2022   TETANUS/TDAP  03/27/2029   Pneumonia Vaccine 58+ Years old  Completed   INFLUENZA VACCINE  Completed   DEXA SCAN  Completed   Zoster Vaccines- Shingrix  Completed   HPV VACCINES  Aged Out    Health Maintenance  Health Maintenance Due  Topic Date Due   COVID-19 Vaccine (2 - Pfizer series) 08/13/2019    Colorectal cancer screening: No longer required.   Mammogram status: Completed 09/19/20. Repeat every year  Bone Density status: Ordered 09/13/21. Pt provided with contact info and advised to call to schedule appt.  Lung Cancer Screening: (Low Dose CT Chest recommended if Age 91-80 years, 30 pack-year currently smoking OR have quit w/in 15years.) does not qualify.   Lung Cancer Screening Referral: N/A  Additional Screening:  Hepatitis C Screening: does not qualify; Completed Aged out   Vision Screening: Recommended annual ophthalmology exams for early detection of glaucoma and other disorders of the eye. Is the  patient up to date with their annual eye exam?  Yes  Who is the provider or what is the name of the office in which the patient attends annual eye exams? Dr. Baird [facility]  If pt is not established with a provider, would they like to be referred to a provider to establish care? No .   Dental Screening: Recommended annual dental exams for proper oral hygiene  Community Resource Referral / Chronic Care Management: CRR required this visit?  No   CCM required this visit?  No      Plan:     I have personally reviewed and noted the following in the patients chart:   Medical and social history Use of alcohol, tobacco or illicit drugs  Current medications and supplements including opioid prescriptions. Patient is not currently taking opioid prescriptions. Functional ability and status Nutritional status Physical activity Advanced directives List of other physicians Hospitalizations, surgeries, and ER visits in previous 12 months Vitals Screenings to include cognitive, depression, and falls Referrals and appointments  In addition, I have reviewed and discussed with patient certain preventive protocols, quality metrics, and best practice recommendations. A written personalized care plan for preventive services as well as general preventive health recommendations were provided to patient.    Due to this being a telephonic visit, the after visit summary with patients personalized plan was offered to patient via mail or [email or web address] was mailed a copy of AVS.   Duard Brady Quintasha Gren, [place]   09/13/2021   Nurse Notes: Mammogram and bone density referral placed

## 2021-09-13 ENCOUNTER — Ambulatory Visit (INDEPENDENT_AMBULATORY_CARE_PROVIDER_SITE_OTHER): Payer: Medicare HMO

## 2021-09-13 DIAGNOSIS — Z780 Asymptomatic menopausal state: Secondary | ICD-10-CM

## 2021-09-13 DIAGNOSIS — Z0000 Encounter for general adult medical examination without abnormal findings: Secondary | ICD-10-CM | POA: Diagnosis not present

## 2021-09-13 DIAGNOSIS — Z1231 Encounter for screening mammogram for malignant neoplasm of breast: Secondary | ICD-10-CM

## 2021-09-13 NOTE — Patient Instructions (Signed)
[name] , Thank you for taking time to come for your Medicare Wellness Visit. I appreciate your ongoing commitment to your health goals. Please review the following plan we discussed and let me know if I can assist you in the future.   Screening recommendations/referrals: Colonoscopy: no longer needed  Mammogram: 09/19/20 due 09/19/21 Bone Density: ordered 09/13/21 Recommended yearly ophthalmology/optometry visit for glaucoma screening and checkup Recommended yearly dental visit for hygiene and checkup  Vaccinations: Influenza vaccine: up to date Pneumococcal vaccine: up to date Tdap vaccine: up to date Shingles vaccine: up to date   Covid-19:Due-May obtain vaccine at your local pharmacy.   Advanced directives: yes, declined given copy at this time  Conditions/risks identified: see problem list  Next appointment: Follow up in one year for your annual wellness visit    Preventive Care 65 Years and Older, Female Preventive care refers to lifestyle choices and visits with your health care provider that can promote health and wellness. What does preventive care include? A yearly physical exam. This is also called an annual well check. Dental exams once or twice a year. Routine eye exams. Ask your health care provider how often you should have your eyes checked. Personal lifestyle choices, including: Daily care of your teeth and gums. Regular physical activity. Eating a healthy diet. Avoiding tobacco and drug use. Limiting alcohol use. Practicing safe sex. Taking low-dose aspirin every day. Taking vitamin and mineral supplements as recommended by your health care provider. What happens during an annual well check? The services and screenings done by your health care provider during your annual well check will depend on your age, overall health, lifestyle risk factors, and family history of disease. Counseling  Your health care provider may ask you questions about your: Alcohol  use. Tobacco use. Drug use. Emotional well-being. Home and relationship well-being. Sexual activity. Eating habits. History of falls. Memory and ability to understand (cognition). Work and work Statistician. Reproductive health. Screening  You may have the following tests or measurements: Height, weight, and BMI. Blood pressure. Lipid and cholesterol levels. These may be checked every 5 years, or more frequently if you are over 76 years old. Skin check. Lung cancer screening. You may have this screening every year starting at age 23 if you have a 30-pack-year history of smoking and currently smoke or have quit within the past 15 years. Fecal occult blood test (FOBT) of the stool. You may have this test every year starting at age 29. Flexible sigmoidoscopy or colonoscopy. You may have a sigmoidoscopy every 5 years or a colonoscopy every 10 years starting at age 23. Hepatitis C blood test. Hepatitis B blood test. Sexually transmitted disease (STD) testing. Diabetes screening. This is done by checking your blood sugar (glucose) after you have not eaten for a while (fasting). You may have this done every 1-3 years. Bone density scan. This is done to screen for osteoporosis. You may have this done starting at age 34. Mammogram. This may be done every 1-2 years. Talk to your health care provider about how often you should have regular mammograms. Talk with your health care provider about your test results, treatment options, and if necessary, the need for more tests. Vaccines  Your health care provider may recommend certain vaccines, such as: Influenza vaccine. This is recommended every year. Tetanus, diphtheria, and acellular pertussis (Tdap, Td) vaccine. You may need a Td booster every 10 years. Zoster vaccine. You may need this after age 58. Pneumococcal 13-valent conjugate (PCV13) vaccine. One  dose is recommended after age 69. Pneumococcal polysaccharide (PPSV23) vaccine. One dose is  recommended after age 50. Talk to your health care provider about which screenings and vaccines you need and how often you need them. This information is not intended to replace advice given to you by your health care provider. Make sure you discuss any questions you have with your health care provider. Document Released: 07/17/2015 Document Revised: 03/09/2016 Document Reviewed: 04/21/2015 Elsevier Interactive Patient Education  2017 [place] Prevention in the Home Falls can cause injuries. They can happen to people of all ages. There are many things you can do to make your home safe and to help prevent falls. What can I do on the outside of my home? Regularly fix the edges of walkways and driveways and fix any cracks. Remove anything that might make you trip as you walk through a door, such as a raised step or threshold. Trim any bushes or trees on the path to your home. Use bright outdoor lighting. Clear any walking paths of anything that might make someone trip, such as rocks or tools. Regularly check to see if handrails are loose or broken. Make sure that both sides of any steps have handrails. Any raised decks and porches should have guardrails on the edges. Have any leaves, snow, or ice cleared regularly. Use sand or salt on walking paths during winter. Clean up any spills in your garage right away. This includes oil or grease spills. What can I do in the bathroom? Use night lights. Install grab bars by the toilet and in the tub and shower. Do not use towel bars as grab bars. Use non-skid mats or decals in the tub or shower. If you need to sit down in the shower, use a plastic, non-slip stool. Keep the floor dry. Clean up any water that spills on the floor as soon as it happens. Remove soap buildup in the tub or shower regularly. Attach bath mats securely with double-sided non-slip rug tape. Do not have throw rugs and other things on the floor that can make you  trip. What can I do in the bedroom? Use night lights. Make sure that you have a light by your bed that is easy to reach. Do not use any sheets or blankets that are too big for your bed. They should not hang down onto the floor. Have a firm chair that has side arms. You can use this for support while you get dressed. Do not have throw rugs and other things on the floor that can make you trip. What can I do in the kitchen? Clean up any spills right away. Avoid walking on wet floors. Keep items that you use a lot in easy-to-reach places. If you need to reach something above you, use a strong step stool that has a grab bar. Keep electrical cords out of the way. Do not use floor polish or wax that makes floors slippery. If you must use wax, use non-skid floor wax. Do not have throw rugs and other things on the floor that can make you trip. What can I do with my stairs? Do not leave any items on the stairs. Make sure that there are handrails on both sides of the stairs and use them. Fix handrails that are broken or loose. Make sure that handrails are as long as the stairways. Check any carpeting to make sure that it is firmly attached to the stairs. Fix any carpet that is loose or worn.  Avoid having throw rugs at the top or bottom of the stairs. If you do have throw rugs, attach them to the floor with carpet tape. Make sure that you have a light switch at the top of the stairs and the bottom of the stairs. If you do not have them, ask someone to add them for you. What else can I do to help prevent falls? Wear shoes that: Do not have high heels. Have rubber bottoms. Are comfortable and fit you well. Are closed at the toe. Do not wear sandals. If you use a stepladder: Make sure that it is fully opened. Do not climb a closed stepladder. Make sure that both sides of the stepladder are locked into place. Ask someone to hold it for you, if possible. Clearly mark and make sure that you can  see: Any grab bars or handrails. First and last steps. Where the edge of each step is. Use tools that help you move around (mobility aids) if they are needed. These include: Canes. Walkers. Scooters. Crutches. Turn on the lights when you go into a dark area. Replace any light bulbs as soon as they burn out. Set up your furniture so you have a clear path. Avoid moving your furniture around. If any of your floors are uneven, fix them. If there are any pets around you, be aware of where they are. Review your medicines with your doctor. Some medicines can make you feel dizzy. This can increase your chance of falling. Ask your doctor what other things that you can do to help prevent falls. This information is not intended to replace advice given to you by your health care provider. Make sure you discuss any questions you have with your health care provider. Document Released: 04/16/2009 Document Revised: 11/26/2015 Document Reviewed: 07/25/2014 Elsevier Interactive Patient Education  2017 [employer].

## 2021-09-21 ENCOUNTER — Ambulatory Visit (HOSPITAL_BASED_OUTPATIENT_CLINIC_OR_DEPARTMENT_OTHER)
Admission: RE | Admit: 2021-09-21 | Discharge: 2021-09-21 | Disposition: A | Discharge status: Home / Self Care | Payer: Medicare HMO | Source: Ambulatory Visit | Attending: Family Medicine | Admitting: Family Medicine

## 2021-09-21 ENCOUNTER — Telehealth: Payer: Self-pay | Admitting: Family Medicine

## 2021-09-21 ENCOUNTER — Encounter (HOSPITAL_BASED_OUTPATIENT_CLINIC_OR_DEPARTMENT_OTHER): Payer: Self-pay

## 2021-09-21 ENCOUNTER — Other Ambulatory Visit: Payer: Self-pay

## 2021-09-21 DIAGNOSIS — Z780 Asymptomatic menopausal state: Secondary | ICD-10-CM

## 2021-09-21 DIAGNOSIS — Z1231 Encounter for screening mammogram for malignant neoplasm of breast: Secondary | ICD-10-CM

## 2021-09-21 DIAGNOSIS — M85852 Other specified disorders of bone density and structure, left thigh: Secondary | ICD-10-CM | POA: Diagnosis not present

## 2021-09-21 DIAGNOSIS — M810 Age-related osteoporosis without current pathological fracture: Secondary | ICD-10-CM | POA: Diagnosis not present

## 2021-09-21 NOTE — Telephone Encounter (Signed)
At CPE patient was told at she has not had prolia shot in a year. ? ? ? ?Patient talked about taking Vit D and not Prolia ? ? ? ? ?Patient is wanting to do the Vit D VS prolia. ?Wants to know what steps to take ? ? ?Call [phone] ?

## 2021-11-04 NOTE — Telephone Encounter (Signed)
Patient wanting to know what she is needing to do....  ? ?She is NOT wanting to do prolia ? ? ? ?

## 2021-11-05 NOTE — Telephone Encounter (Signed)
Called Pt about her concern of the prolia so she stated that her sister was on it and is having dental problem.  ?She want to know what you think about it.  ?

## 2021-11-05 NOTE — Telephone Encounter (Signed)
Called Pt No LM  ?

## 2021-11-10 NOTE — Telephone Encounter (Signed)
Pt called. Husband advised pt had stepped out.  ?

## 2021-11-23 DIAGNOSIS — M48062 Spinal stenosis, lumbar region with neurogenic claudication: Secondary | ICD-10-CM | POA: Diagnosis not present

## 2021-12-29 ENCOUNTER — Other Ambulatory Visit: Payer: Self-pay | Admitting: Family Medicine

## 2021-12-29 DIAGNOSIS — M159 Polyosteoarthritis, unspecified: Secondary | ICD-10-CM

## 2022-01-07 ENCOUNTER — Encounter: Payer: Self-pay | Admitting: Family Medicine

## 2022-01-07 ENCOUNTER — Ambulatory Visit (INDEPENDENT_AMBULATORY_CARE_PROVIDER_SITE_OTHER): Payer: Medicare HMO | Admitting: Family Medicine

## 2022-01-07 VITALS — BP 114/80 | HR 64 | Temp 97.6°F | Resp 18 | Ht 64.0 in | Wt 134.8 lb

## 2022-01-07 DIAGNOSIS — E039 Hypothyroidism, unspecified: Secondary | ICD-10-CM

## 2022-01-07 DIAGNOSIS — E785 Hyperlipidemia, unspecified: Secondary | ICD-10-CM

## 2022-01-07 DIAGNOSIS — M810 Age-related osteoporosis without current pathological fracture: Secondary | ICD-10-CM

## 2022-01-07 DIAGNOSIS — G4700 Insomnia, unspecified: Secondary | ICD-10-CM

## 2022-01-07 LAB — CBC WITH DIFFERENTIAL/PLATELET
Basophils Absolute: 0 10*3/uL (ref 0.0–0.1)
Basophils Relative: 0.6 % (ref 0.0–3.0)
Eosinophils Absolute: 0.1 10*3/uL (ref 0.0–0.7)
Eosinophils Relative: 1.8 % (ref 0.0–5.0)
HCT: 40.7 % (ref 36.0–46.0)
Hemoglobin: 13.7 g/dL (ref 12.0–15.0)
Lymphocytes Relative: 29.7 % (ref 12.0–46.0)
Lymphs Abs: 2 10*3/uL (ref 0.7–4.0)
MCHC: 33.7 g/dL (ref 30.0–36.0)
MCV: 100.4 fl — ABNORMAL HIGH (ref 78.0–100.0)
Monocytes Absolute: 0.7 10*3/uL (ref 0.1–1.0)
Monocytes Relative: 10.4 % (ref 3.0–12.0)
Neutro Abs: 3.9 10*3/uL (ref 1.4–7.7)
Neutrophils Relative %: 57.5 % (ref 43.0–77.0)
Platelets: 261 10*3/uL (ref 150.0–400.0)
RBC: 4.05 Mil/uL (ref 3.87–5.11)
RDW: 12.8 % (ref 11.5–15.5)
WBC: 6.7 10*3/uL (ref 4.0–10.5)

## 2022-01-07 LAB — COMPREHENSIVE METABOLIC PANEL
ALT: 13 U/L (ref 0–35)
AST: 18 U/L (ref 0–37)
Albumin: 4.3 g/dL (ref 3.5–5.2)
Alkaline Phosphatase: 71 U/L (ref 39–117)
BUN: 20 mg/dL (ref 6–23)
CO2: 32 mEq/L (ref 19–32)
Calcium: 9.8 mg/dL (ref 8.4–10.5)
Chloride: 101 mEq/L (ref 96–112)
Creatinine, Ser: 0.74 mg/dL (ref 0.40–1.20)
GFR: 75.55 mL/min (ref >60.00)
Glucose, Bld: 75 mg/dL (ref 70–99)
Potassium: 4.8 mEq/L (ref 3.5–5.1)
Sodium: 140 mEq/L (ref 135–145)
Total Bilirubin: 0.4 mg/dL (ref 0.2–1.2)
Total Protein: 6.2 g/dL (ref 6.0–8.3)

## 2022-01-07 LAB — TSH: TSH: 4.64 u[IU]/mL (ref 0.35–5.50)

## 2022-01-07 LAB — LIPID PANEL
Cholesterol: 189 mg/dL (ref 0–200)
HDL: 68.4 mg/dL (ref >39.00)
LDL Cholesterol: 91 mg/dL (ref 0–99)
NonHDL: 120.83
Total CHOL/HDL Ratio: 3
Triglycerides: 149 mg/dL (ref 0.0–149.0)
VLDL: 29.8 mg/dL (ref 0.0–40.0)

## 2022-01-07 MED ORDER — ZOLPIDEM TARTRATE 5 MG PO TABS: 5.0000 mg | ORAL_TABLET | Freq: Every day | ORAL | 1 refills | Status: DC

## 2022-01-07 MED ORDER — ALENDRONATE SODIUM 70 MG PO TABS: 70.0000 mg | ORAL_TABLET | ORAL | 3 refills | Status: DC

## 2022-01-07 MED ORDER — ATORVASTATIN CALCIUM 40 MG PO TABS: 40.0000 mg | ORAL_TABLET | Freq: Every day | ORAL | 1 refills | Status: DC

## 2022-01-07 NOTE — Assessment & Plan Note (Signed)
Encourage heart healthy diet such as MIND or DASH diet, increase exercise, avoid trans fats, simple carbohydrates and processed foods, consider a krill or fish or flaxseed oil cap daily.  °

## 2022-01-07 NOTE — Patient Instructions (Signed)
Cholesterol Content in Foods ?Cholesterol is a waxy, fat-like substance that helps to carry fat in the blood. The body needs cholesterol in small amounts, but too much cholesterol can cause damage to the arteries and heart. ?What foods have cholesterol? ? ?Cholesterol is found in animal-based foods, such as meat, seafood, and dairy. Generally, low-fat dairy and lean meats have less cholesterol than full-fat dairy and fatty meats. The milligrams of cholesterol per serving (mg per serving) of common cholesterol-containing foods are listed below. ?Meats and other proteins ?Egg -- one large whole egg has 186 mg. ?Veal shank -- 4 oz (113 g) has 141 mg. ?Lean ground turkey (93% lean) -- 4 oz (113 g) has 118 mg. ?Fat-trimmed lamb loin -- 4 oz (113 g) has 106 mg. ?Lean ground beef (90% lean) -- 4 oz (113 g) has 100 mg. ?Lobster -- 3.5 oz (99 g) has 90 mg. ?Pork loin chops -- 4 oz (113 g) has 86 mg. ?Canned salmon -- 3.5 oz (99 g) has 83 mg. ?Fat-trimmed beef top loin -- 4 oz (113 g) has 78 mg. ?Frankfurter -- 1 frank (3.5 oz or 99 g) has 77 mg. ?Crab -- 3.5 oz (99 g) has 71 mg. ?Roasted chicken without skin, white meat -- 4 oz (113 g) has 66 mg. ?Light bologna -- 2 oz (57 g) has 45 mg. ?Deli-cut turkey -- 2 oz (57 g) has 31 mg. ?Canned tuna -- 3.5 oz (99 g) has 31 mg. ?Bacon -- 1 oz (28 g) has 29 mg. ?Oysters and mussels (raw) -- 3.5 oz (99 g) has 25 mg. ?Mackerel -- 1 oz (28 g) has 22 mg. ?Trout -- 1 oz (28 g) has 20 mg. ?Pork sausage -- 1 link (1 oz or 28 g) has 17 mg. ?Salmon -- 1 oz (28 g) has 16 mg. ?Tilapia -- 1 oz (28 g) has 14 mg. ?Dairy ?Soft-serve ice cream -- ? cup (4 oz or 86 g) has 103 mg. ?Whole-milk yogurt -- 1 cup (8 oz or 245 g) has 29 mg. ?Cheddar cheese -- 1 oz (28 g) has 28 mg. ?American cheese -- 1 oz (28 g) has 28 mg. ?Whole milk -- 1 cup (8 oz or 250 mL) has 23 mg. ?2% milk -- 1 cup (8 oz or 250 mL) has 18 mg. ?Cream cheese -- 1 tablespoon (Tbsp) (14.5 g) has 15 mg. ?Cottage cheese -- ? cup (4 oz or  113 g) has 14 mg. ?Low-fat (1%) milk -- 1 cup (8 oz or 250 mL) has 10 mg. ?Sour cream -- 1 Tbsp (12 g) has 8.5 mg. ?Low-fat yogurt -- 1 cup (8 oz or 245 g) has 8 mg. ?Nonfat Greek yogurt -- 1 cup (8 oz or 228 g) has 7 mg. ?Half-and-half cream -- 1 Tbsp (15 mL) has 5 mg. ?Fats and oils ?Cod liver oil -- 1 tablespoon (Tbsp) (13.6 g) has 82 mg. ?Butter -- 1 Tbsp (14 g) has 15 mg. ?Lard -- 1 Tbsp (12.8 g) has 14 mg. ?Bacon grease -- 1 Tbsp (12.9 g) has 14 mg. ?Mayonnaise -- 1 Tbsp (13.8 g) has 5-10 mg. ?Margarine -- 1 Tbsp (14 g) has 3-10 mg. ?The items listed above may not be a complete list of foods with cholesterol. Exact amounts of cholesterol in these foods may vary depending on specific ingredients and brands. Contact a dietitian for more information. ?What foods do not have cholesterol? ?Most plant-based foods do not have cholesterol unless you combine them with a food that has   cholesterol. Foods without cholesterol include: ?Grains and cereals. ?Vegetables. ?Fruits. ?Vegetable oils, such as olive, canola, and sunflower oil. ?Legumes, such as peas, beans, and lentils. ?Nuts and seeds. ?Egg whites. ?The items listed above may not be a complete list of foods that do not have cholesterol. Contact a dietitian for more information. ?Summary ?The body needs cholesterol in small amounts, but too much cholesterol can cause damage to the arteries and heart. ?Cholesterol is found in animal-based foods, such as meat, seafood, and dairy. Generally, low-fat dairy and lean meats have less cholesterol than full-fat dairy and fatty meats. ?This information is not intended to replace advice given to you by your health care provider. Make sure you discuss any questions you have with your health care provider. ?Document Revised: 10/30/2020 Document Reviewed: 10/30/2020 ?Elsevier Patient Education ? 2023 Elsevier Inc. ? ?

## 2022-01-07 NOTE — Assessment & Plan Note (Signed)
Check labs con't synthroid 

## 2022-01-07 NOTE — Progress Notes (Signed)
Established Patient Office Visit  Subjective   Patient ID: Joanna Williams, female    DOB: Feb 02, 1941  Age: 81 y.o. MRN: [id number]  Chief Complaint  Patient presents with   Hyperlipidemia   Hypothyroidism   Follow-up    HPI  Patient Active Problem List   Diagnosis Date Noted   Preventative health care 07/18/2021   Age-related osteoporosis without current pathological fracture 06/04/2019   Murmur 06/04/2019   Tongue lesion 06/04/2019   Low back pain with radiation 06/04/2019   Right lower quadrant abdominal mass 01/24/2019   Carpal tunnel syndrome on both sides 11/06/2017   UTI (urinary tract infection) 05/12/2014   Sciatica 02/04/2014   GERD (gastroesophageal reflux disease) 08/13/2012   Hypothyroidism 06/15/2010   MOLE 11/27/2009   Hyperlipidemia LDL goal <100 02/13/2007   Past Medical History:  Diagnosis Date   GERD (gastroesophageal reflux disease)    Hyperlipidemia    Macular degeneration 05/04/2017   Past Surgical History:  Procedure Laterality Date   COLONOSCOPY  2 yrs ago   EYE SURGERY Bilateral 2014   cataract   TOTAL HIP ARTHROPLASTY  08/10/2011   Procedure: TOTAL HIP ARTHROPLASTY;  Surgeon: Kerin Salen, MD;  Location: [facility];  Service: Orthopedics;  Laterality: Right;  DEPUY/ PENNACLE POLY OR CERAMIC   Social History   Tobacco Use   Smoking status: Never   Smokeless tobacco: Never  Substance Use Topics   Alcohol use: Yes    Alcohol/week: 1.0 standard drink of alcohol    Types: 1 Glasses of wine per week   Drug use: Yes    Types: Hydrocodone   Social History   Socioeconomic History   Marital status: Married    Spouse name: Not on file   Number of children: Not on file   Years of education: Not on file   Highest education level: Not on file  Occupational History   Occupation: retired    Fish farm manager: RETIRED  Tobacco Use   Smoking status: Never   Smokeless tobacco: Never  Substance and Sexual Activity   Alcohol use: Yes    Alcohol/week:  1.0 standard drink of alcohol    Types: 1 Glasses of wine per week   Drug use: Yes    Types: Hydrocodone   Sexual activity: Yes    Partners: Male  Other Topics Concern   Not on file  Social History Narrative   Walking, yoga, pickle ball   Social Determinants of Health   Financial Resource Strain: Low Risk  (09/13/2021)   Overall Financial Resource Strain (CARDIA)    Difficulty of Paying Living Expenses: Not hard at all  Food Insecurity: No Food Insecurity (09/13/2021)   Hunger Vital Sign    Worried About Running Out of Food in the Last Year: Never true    [place] in the Last Year: Never true  Transportation Needs: No Transportation Needs (09/13/2021)   PRAPARE - Hydrologist (Medical): No    Lack of Transportation (Non-Medical): No  Physical Activity: Sufficiently Active (09/13/2021)   Exercise Vital Sign    Days of Exercise per Week: 7 days    Minutes of Exercise per Session: 60 min  Stress: No Stress Concern Present (09/13/2021)   [place]    Feeling of Stress : Not at all  Social Connections: Moderately Integrated (09/13/2021)   Social Connection and Isolation Panel [NHANES]    Frequency of Communication with Friends  and Family: More than three times a week    Frequency of Social Gatherings with Friends and Family: More than three times a week    Attends Religious Services: More than 4 times per year    Active Member of [employer] or Organizations: No    Attends Archivist Meetings: Never    Marital Status: Married  Human resources officer Violence: Not At Risk (09/13/2021)   Humiliation, Afraid, Rape, and Kick questionnaire    Fear of Current or Ex-Partner: No    Emotionally Abused: No    Physically Abused: No    Sexually Abused: No   Family Status  Relation Name Status   Mother  Deceased at age 28   Father  Deceased   Sister  Alive   Brother  Alive   Sister  Alive    Sister  Alive   Sister  Alive   Family History  Problem Relation Age of Onset   Heart disease Mother    Cancer Father 29       brain   Allergies  Allergen Reactions   Niacin     REACTION: Nausea      Review of Systems  Constitutional:  Negative for fever and malaise/fatigue.  HENT:  Negative for congestion.   Eyes:  Negative for blurred vision.  Respiratory:  Negative for shortness of breath.   Cardiovascular:  Negative for chest pain, palpitations and leg swelling.  Gastrointestinal:  Negative for abdominal pain, blood in stool and nausea.  Genitourinary:  Negative for dysuria and frequency.  Musculoskeletal:  Negative for falls.  Skin:  Negative for rash.  Neurological:  Negative for dizziness, loss of consciousness and headaches.  Endo/Heme/Allergies:  Negative for environmental allergies.  Psychiatric/Behavioral:  Negative for depression. The patient is not nervous/anxious.       Objective:     BP 114/80 (BP Location: Left Arm, Patient Position: Sitting, Cuff Size: Normal)   Pulse 64   Temp 97.6 F (36.4 C) (Oral)   Resp 18   Ht '5\' 4"'$  (1.626 m)   Wt 134 lb 12.8 oz (61.1 kg)   BMI 23.14 kg/m  BP Readings from Last 3 Encounters:  01/07/22 114/80  07/13/21 124/60  03/22/21 120/70   Wt Readings from Last 3 Encounters:  01/07/22 134 lb 12.8 oz (61.1 kg)  07/13/21 135 lb 3.2 oz (61.3 kg)  03/22/21 130 lb 12.8 oz (59.3 kg)   SpO2 Readings from Last 3 Encounters:  07/13/21 97%  03/22/21 94%  12/14/20 98%      Physical Exam Vitals and nursing note reviewed.  Constitutional:      Appearance: She is well-developed.  HENT:     Head: Normocephalic and atraumatic.  Eyes:     Conjunctiva/sclera: Conjunctivae normal.  Neck:     Thyroid: No thyromegaly.     Vascular: No carotid bruit or JVD.  Cardiovascular:     Rate and Rhythm: Normal rate and regular rhythm.     Heart sounds: Normal heart sounds. No murmur heard. Pulmonary:     Effort: Pulmonary effort  is normal. No respiratory distress.     Breath sounds: Normal breath sounds. No wheezing or rales.  Chest:     Chest wall: No tenderness.  Musculoskeletal:     Cervical back: Normal range of motion and neck supple.  Neurological:     Mental Status: She is alert and oriented to person, place, and time.  Psychiatric:        Mood and Affect:  Mood normal.        Behavior: Behavior normal.        Thought Content: Thought content normal.        Judgment: Judgment normal.      No results found for any visits on 01/07/22.  Last CBC Lab Results  Component Value Date   WBC 5.7 07/15/2021   HGB 13.6 07/15/2021   HCT 41.2 07/15/2021   MCV 96.3 07/15/2021   MCH 31.3 08/13/2011   RDW 13.3 07/15/2021   PLT 235.0 52/77/8242   Last metabolic panel Lab Results  Component Value Date   GLUCOSE 79 07/15/2021   NA 141 07/15/2021   K 4.5 07/15/2021   CL 102 07/15/2021   CO2 34 (H) 07/15/2021   BUN 16 07/15/2021   CREATININE 0.71 07/15/2021   CALCIUM 9.4 07/15/2021   PROT 6.1 07/15/2021   ALBUMIN 4.2 07/15/2021   BILITOT 0.4 07/15/2021   ALKPHOS 80 07/15/2021   AST 23 07/15/2021   ALT 20 07/15/2021   Last lipids Lab Results  Component Value Date   CHOL 179 07/15/2021   HDL 65.20 07/15/2021   LDLCALC 85 07/15/2021   LDLDIRECT 84.0 12/14/2020   TRIG 144.0 07/15/2021   CHOLHDL 3 07/15/2021   Last hemoglobin A1c Lab Results  Component Value Date   HGBA1C 5.8 08/13/2012   Last thyroid functions Lab Results  Component Value Date   TSH 3.99 12/14/2020   Last vitamin D Lab Results  Component Value Date   VD25OH 23.76 (L) 08/26/2014   Last vitamin B12 and Folate No results found for: "VITAMINB12", "FOLATE"    The ASCVD Risk score (Arnett DK, et al., 2019) failed to calculate for the following reasons:   The 2019 ASCVD risk score is only valid for ages 46 to 66    Assessment & Plan:   Problem List Items Addressed This Visit       Unprioritized   Age-related  osteoporosis without current pathological fracture - Primary   Relevant Medications   alendronate (FOSAMAX) 70 MG tablet   Hypothyroidism    Check labs con't synthroid       Relevant Orders   TSH   Hyperlipidemia LDL goal <100    Encourage heart healthy diet such as MIND or DASH diet, increase exercise, avoid trans fats, simple carbohydrates and processed foods, consider a krill or fish or flaxseed oil cap daily.       Relevant Medications   atorvastatin (LIPITOR) 40 MG tablet   Other Visit Diagnoses     Hyperlipidemia, unspecified hyperlipidemia type       Relevant Medications   atorvastatin (LIPITOR) 40 MG tablet   Other Relevant Orders   CBC with Differential/Platelet   Comprehensive metabolic panel   Lipid panel   Insomnia, unspecified type       Relevant Medications   zolpidem (AMBIEN) 5 MG tablet       Return in about 6 months (around 07/10/2022), or if symptoms worsen or fail to improve, for hyperlipidemia.    Ann Held, DO

## 2022-01-12 DIAGNOSIS — H353113 Nonexudative age-related macular degeneration, right eye, advanced atrophic without subfoveal involvement: Secondary | ICD-10-CM | POA: Diagnosis not present

## 2022-01-12 DIAGNOSIS — H353112 Nonexudative age-related macular degeneration, right eye, intermediate dry stage: Secondary | ICD-10-CM | POA: Diagnosis not present

## 2022-01-12 DIAGNOSIS — D3131 Benign neoplasm of right choroid: Secondary | ICD-10-CM | POA: Diagnosis not present

## 2022-01-12 DIAGNOSIS — H353223 Exudative age-related macular degeneration, left eye, with inactive scar: Secondary | ICD-10-CM | POA: Diagnosis not present

## 2022-02-24 DIAGNOSIS — M48062 Spinal stenosis, lumbar region with neurogenic claudication: Secondary | ICD-10-CM | POA: Diagnosis not present

## 2022-04-05 ENCOUNTER — Encounter: Payer: Self-pay | Admitting: Family Medicine

## 2022-04-05 ENCOUNTER — Ambulatory Visit (INDEPENDENT_AMBULATORY_CARE_PROVIDER_SITE_OTHER): Payer: Medicare HMO | Admitting: Family Medicine

## 2022-04-05 VITALS — BP 128/80 | HR 92 | Temp 98.3°F | Ht 64.0 in | Wt 134.1 lb

## 2022-04-05 DIAGNOSIS — S51812A Laceration without foreign body of left forearm, initial encounter: Secondary | ICD-10-CM | POA: Diagnosis not present

## 2022-04-05 NOTE — Patient Instructions (Signed)
When you do wash it, use only soap and water. Do not vigorously scrub. Apply triple antibiotic ointment (like Neosporin) twice daily. Keep the area clean and dry.   Things to look out for: increasing pain not relieved by acetaminophen, fevers, spreading redness, drainage of pus, or foul odor.  Let us know if you need anything.

## 2022-04-05 NOTE — Progress Notes (Signed)
Chief Complaint  Patient presents with   Arm Injury    Joanna Williams is a 81 y.o. female here for a skin complaint.  Duration: 4 days Location: L forearm Pruritic? No Painful? Yes Drainage? No Scraped against door Other associated symptoms: skin tore; no redness or fevers Therapies tried thus far: none  Past Medical History:  Diagnosis Date   GERD (gastroesophageal reflux disease)    Hyperlipidemia    Macular degeneration 05/04/2017    BP 128/80 (BP Location: Right Arm, Patient Position: Sitting, Cuff Size: Normal)   Pulse 92   Temp 98.3 F (36.8 C) (Oral)   Ht '5\' 4"'$  (1.626 m)   Wt 134 lb 2 oz (60.8 kg)   SpO2 96%   BMI 23.02 kg/m  Gen: awake, alert, appearing stated age Lungs: No accessory muscle use Skin: See below. No drainage, erythema, TTP, fluctuance, excoriation Psych: Age appropriate judgment and insight   L forearm  Skin tear of forearm without complication, left, initial encounter  UTD w Tdap. Keep c/d. TAO bid for 7-10 d. Warning signs and symptoms verbalized and written down in AVS. Dressed today prior to leaving. No signs of infection.  F/u prn. The patient voiced understanding and agreement to the plan.  [place], DO 04/05/22 3:45 PM

## 2022-04-13 DIAGNOSIS — H353112 Nonexudative age-related macular degeneration, right eye, intermediate dry stage: Secondary | ICD-10-CM | POA: Diagnosis not present

## 2022-05-30 DIAGNOSIS — M48062 Spinal stenosis, lumbar region with neurogenic claudication: Secondary | ICD-10-CM | POA: Diagnosis not present

## 2022-07-13 DIAGNOSIS — H353220 Exudative age-related macular degeneration, left eye, stage unspecified: Secondary | ICD-10-CM | POA: Diagnosis not present

## 2022-07-13 DIAGNOSIS — H43813 Vitreous degeneration, bilateral: Secondary | ICD-10-CM | POA: Diagnosis not present

## 2022-07-13 DIAGNOSIS — H26493 Other secondary cataract, bilateral: Secondary | ICD-10-CM | POA: Diagnosis not present

## 2022-07-26 DIAGNOSIS — H353210 Exudative age-related macular degeneration, right eye, stage unspecified: Secondary | ICD-10-CM | POA: Diagnosis not present

## 2022-08-22 DIAGNOSIS — H524 Presbyopia: Secondary | ICD-10-CM | POA: Diagnosis not present

## 2022-08-29 DIAGNOSIS — H5213 Myopia, bilateral: Secondary | ICD-10-CM | POA: Diagnosis not present

## 2022-08-29 DIAGNOSIS — H52209 Unspecified astigmatism, unspecified eye: Secondary | ICD-10-CM | POA: Diagnosis not present

## 2022-08-29 DIAGNOSIS — H524 Presbyopia: Secondary | ICD-10-CM | POA: Diagnosis not present

## 2022-08-31 DIAGNOSIS — M48062 Spinal stenosis, lumbar region with neurogenic claudication: Secondary | ICD-10-CM | POA: Diagnosis not present

## 2022-09-09 ENCOUNTER — Other Ambulatory Visit: Payer: Self-pay | Admitting: Family Medicine

## 2022-09-09 DIAGNOSIS — E785 Hyperlipidemia, unspecified: Secondary | ICD-10-CM

## 2022-09-20 DIAGNOSIS — H353211 Exudative age-related macular degeneration, right eye, with active choroidal neovascularization: Secondary | ICD-10-CM | POA: Diagnosis not present

## 2022-09-20 DIAGNOSIS — H26493 Other secondary cataract, bilateral: Secondary | ICD-10-CM | POA: Diagnosis not present

## 2022-09-20 DIAGNOSIS — H353223 Exudative age-related macular degeneration, left eye, with inactive scar: Secondary | ICD-10-CM | POA: Diagnosis not present

## 2022-09-20 DIAGNOSIS — H43813 Vitreous degeneration, bilateral: Secondary | ICD-10-CM | POA: Diagnosis not present

## 2022-09-22 DIAGNOSIS — M25552 Pain in left hip: Secondary | ICD-10-CM | POA: Diagnosis not present

## 2022-09-22 DIAGNOSIS — M48062 Spinal stenosis, lumbar region with neurogenic claudication: Secondary | ICD-10-CM | POA: Diagnosis not present

## 2022-09-23 DIAGNOSIS — M1612 Unilateral primary osteoarthritis, left hip: Secondary | ICD-10-CM | POA: Diagnosis not present

## 2022-09-24 IMAGING — MG MM DIGITAL SCREENING BILAT W/ TOMO AND CAD
6 of 10 series · 6 of 30 positions shown · non-contrast
Comparison: Previous exam(s).

ACR Breast Density Category a: The breast tissue is almost entirely
fatty.

CLINICAL DATA: Screening.

EXAM:
DIGITAL SCREENING BILATERAL MAMMOGRAM WITH TOMOSYNTHESIS AND CAD
TECHNIQUE: Bilateral screening digital craniocaudal and mediolateral oblique
mammograms were obtained. Bilateral screening digital breast
tomosynthesis was performed. The images were evaluated with
computer-aided detection.

[L MLO synth-2D]
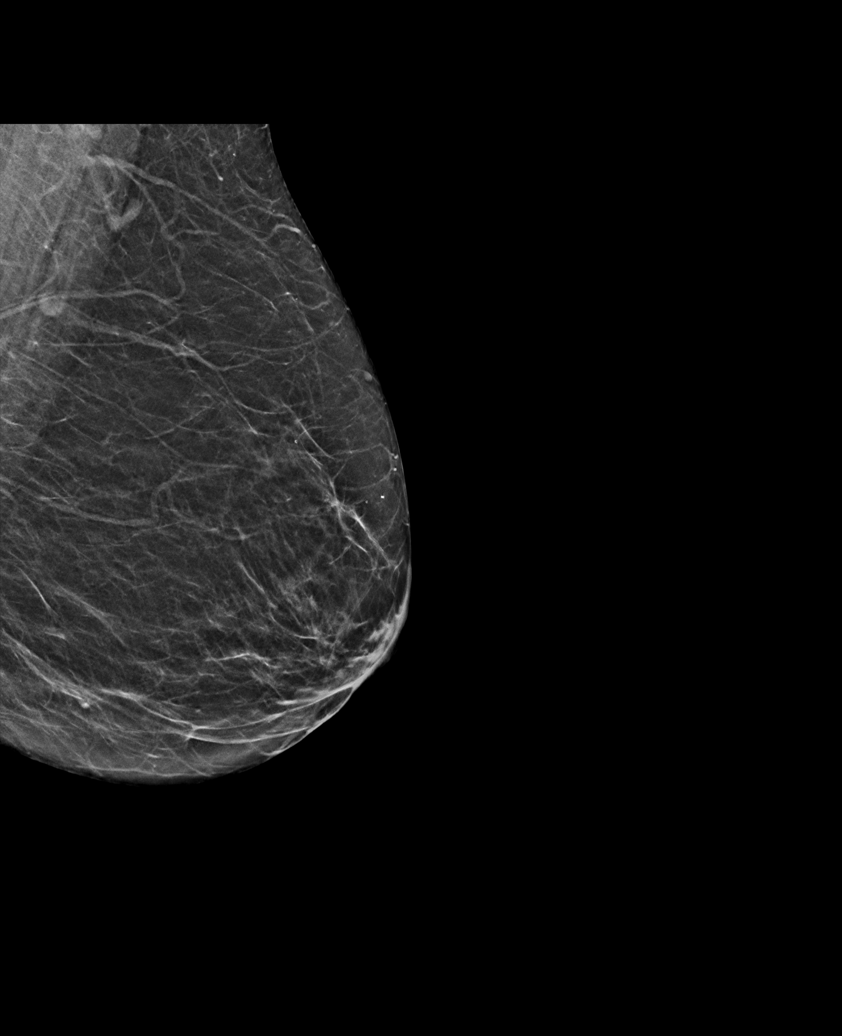

[R MLO synth-2D (1 of 2)]
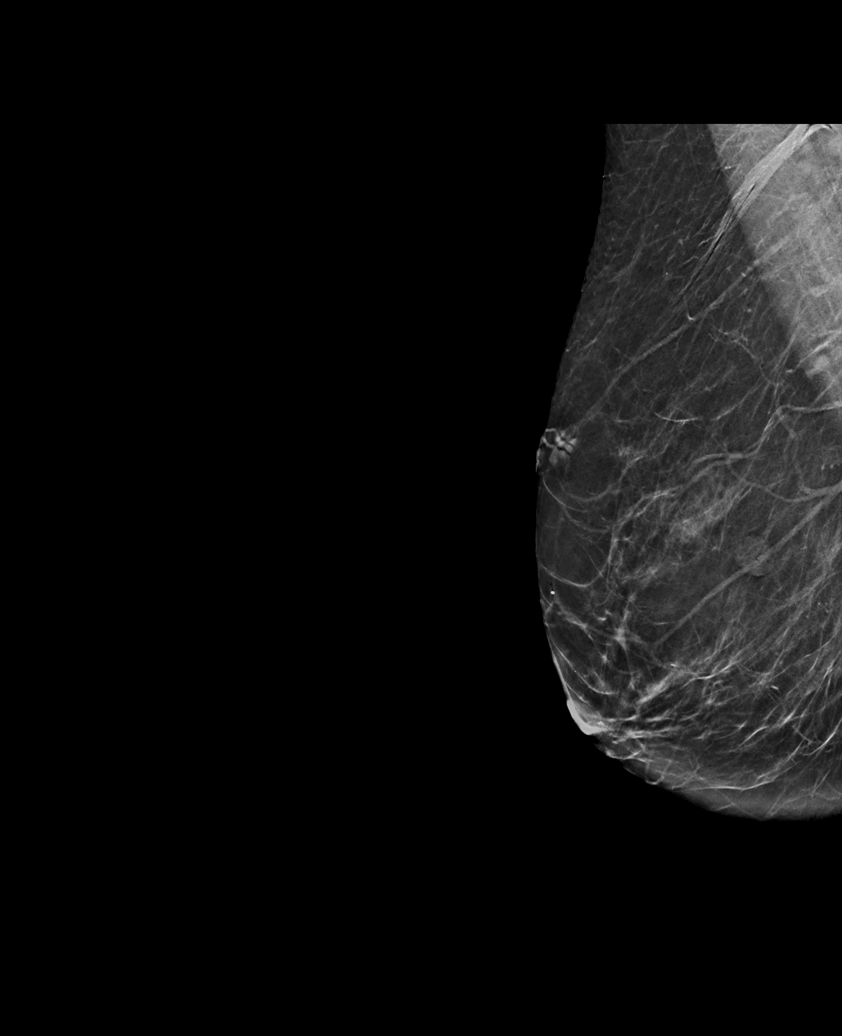

[R MLO synth-2D (2 of 2)]
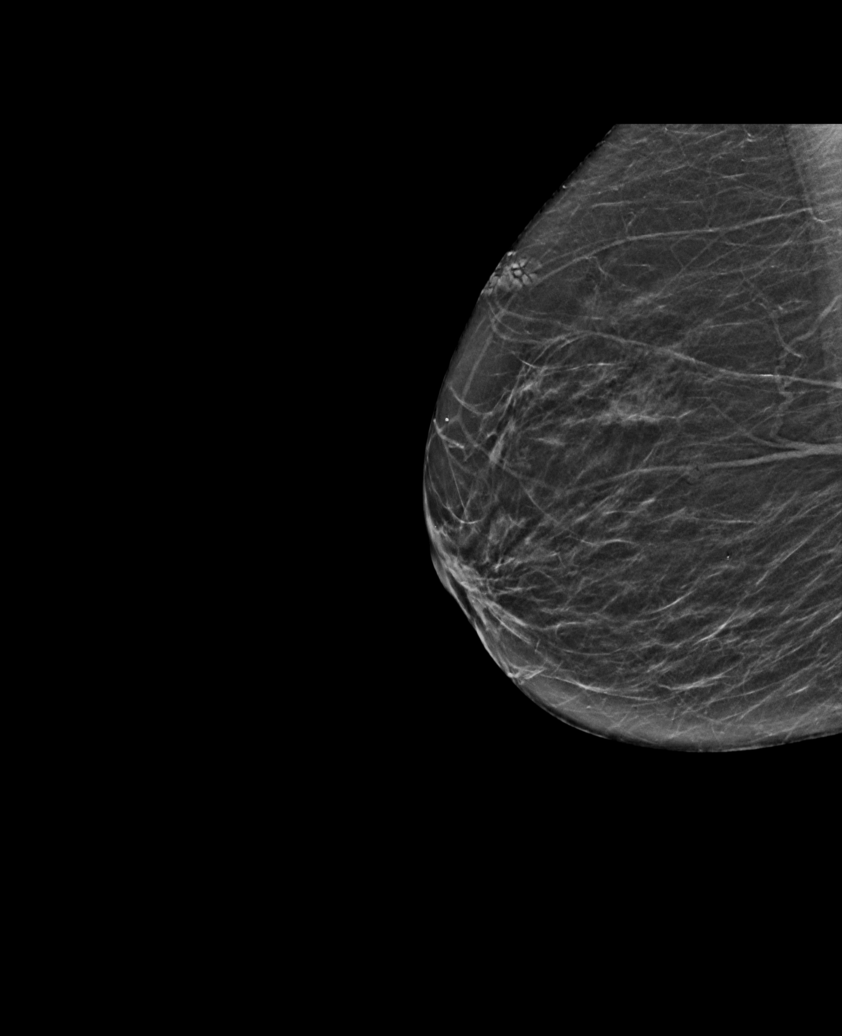

[R CC synth-2D]
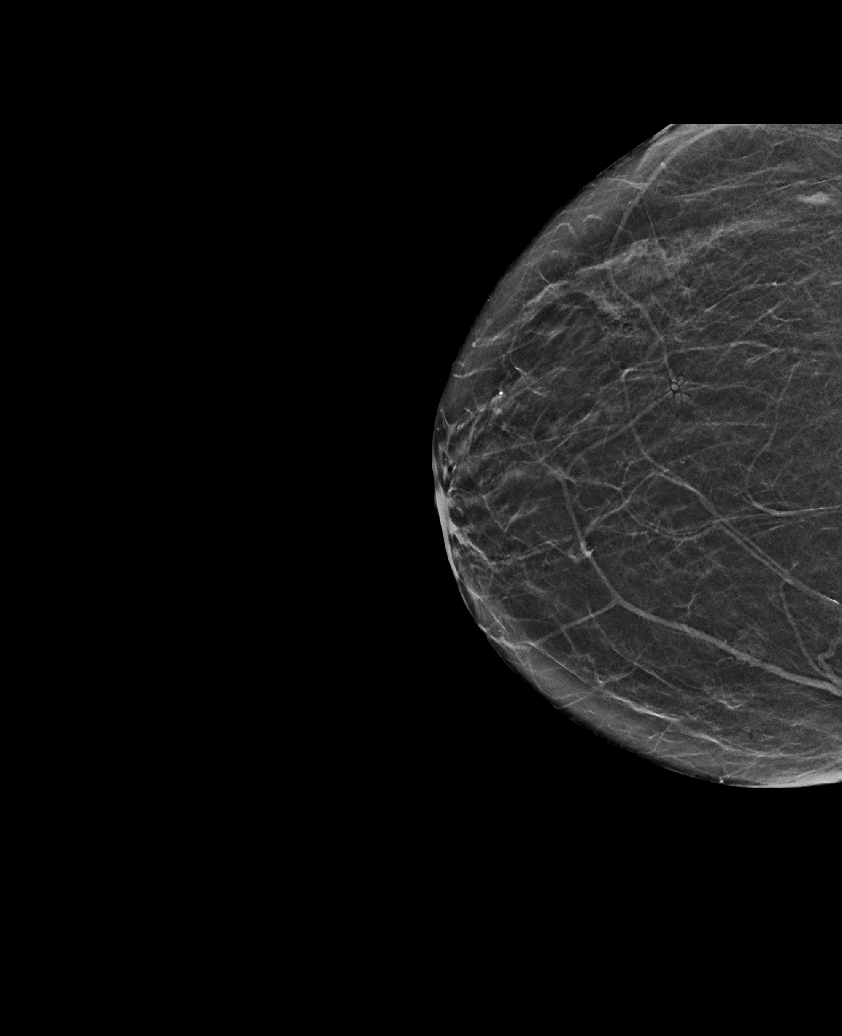

[L CC synth-2D]
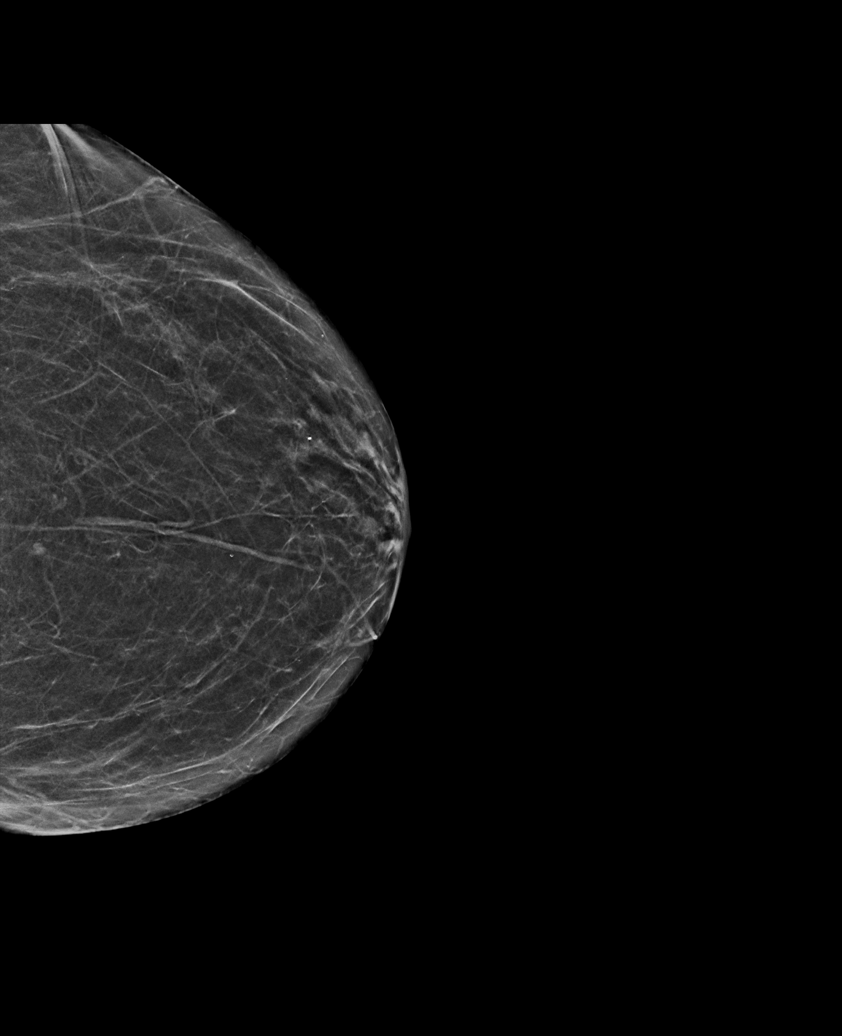

[R MLO tomo · tomo slice 21/42.0]
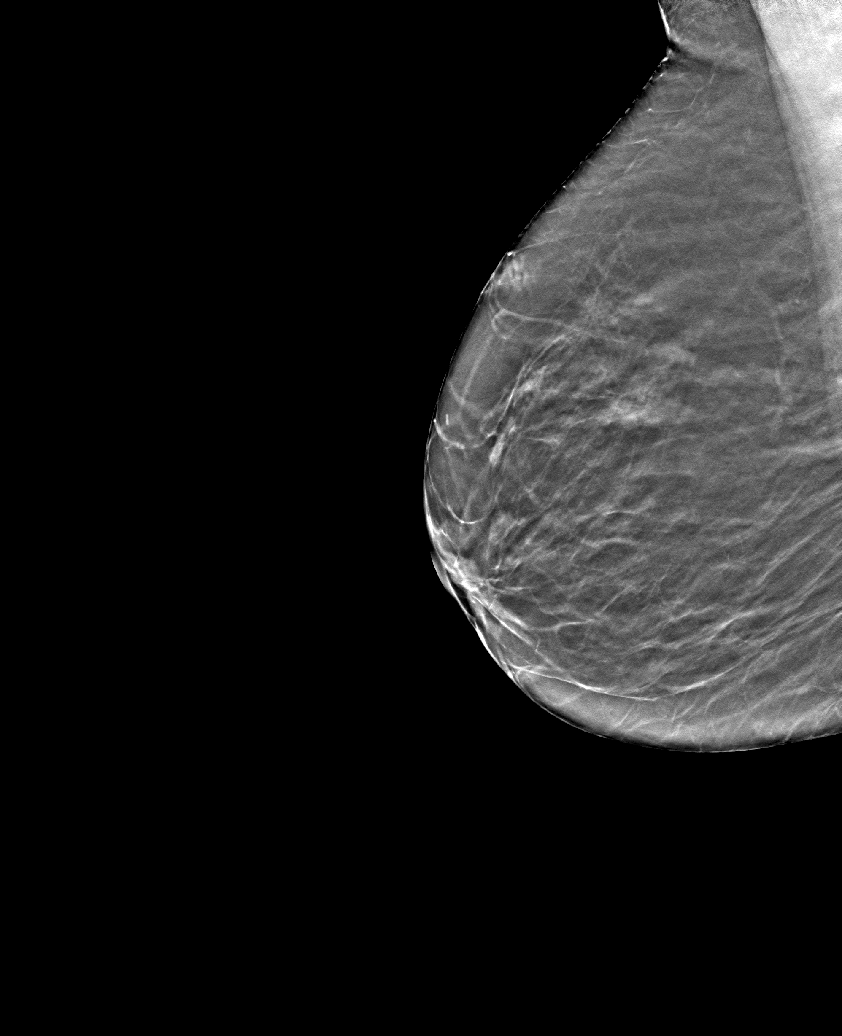

[6 of 30 positions shown; findings below may reference images not displayed]

FINDINGS: There are no findings suspicious for malignancy.
IMPRESSION: No mammographic evidence of malignancy. A result letter of this
screening mammogram will be mailed directly to the patient.

RECOMMENDATION:
Screening mammogram in one year. (Code:0E-3-N98)

BI-RADS CATEGORY  1: Negative.

## 2022-09-26 ENCOUNTER — Ambulatory Visit (INDEPENDENT_AMBULATORY_CARE_PROVIDER_SITE_OTHER): Payer: Medicare HMO | Admitting: Family Medicine

## 2022-09-26 ENCOUNTER — Encounter: Payer: Self-pay | Admitting: Family Medicine

## 2022-09-26 VITALS — BP 118/70 | HR 79 | Temp 97.6°F | Resp 18 | Ht 64.0 in | Wt 138.0 lb

## 2022-09-26 DIAGNOSIS — E039 Hypothyroidism, unspecified: Secondary | ICD-10-CM

## 2022-09-26 DIAGNOSIS — E785 Hyperlipidemia, unspecified: Secondary | ICD-10-CM

## 2022-09-26 DIAGNOSIS — G4700 Insomnia, unspecified: Secondary | ICD-10-CM | POA: Diagnosis not present

## 2022-09-26 DIAGNOSIS — Z01818 Encounter for other preprocedural examination: Secondary | ICD-10-CM | POA: Diagnosis not present

## 2022-09-26 DIAGNOSIS — M25552 Pain in left hip: Secondary | ICD-10-CM

## 2022-09-26 MED ORDER — ZOLPIDEM TARTRATE 5 MG PO TABS: 5.0000 mg | ORAL_TABLET | Freq: Every day | ORAL | 1 refills | Status: DC

## 2022-09-26 NOTE — Assessment & Plan Note (Signed)
Encourage heart healthy diet such as MIND or DASH diet, increase exercise, avoid trans fats, simple carbohydrates and processed foods, consider a krill or fish or flaxseed oil cap daily.  °

## 2022-09-26 NOTE — Assessment & Plan Note (Signed)
Pt cleared for hip replacement   

## 2022-09-26 NOTE — Progress Notes (Addendum)
Subjective:   By signing my name below, I, Jamey Reas, attest that this documentation has been prepared under the direction and in the presence of Ann Held, DO. 09/26/2022   Patient ID: Joanna Williams, female    DOB: 1941/03/03, 82 y.o.   MRN: [id number]  Chief Complaint  Patient presents with   Pre-op Exam    Left hip,     HPI Patient is in today for a pre-operation visit.   She is seeking clearance for an anterior right hip replacement surgery. She denies having any previous issues with anesthesia. She denies having any broken teeth. Her blood work is stable.   Back pain She has pain that radiates from her lower back to her leg.   Past Medical History:  Diagnosis Date   GERD (gastroesophageal reflux disease)    Hyperlipidemia    Macular degeneration 05/04/2017    Past Surgical History:  Procedure Laterality Date   COLONOSCOPY  2 yrs ago   EYE SURGERY Bilateral 2014   cataract   TOTAL HIP ARTHROPLASTY  08/10/2011   Procedure: TOTAL HIP ARTHROPLASTY;  Surgeon: Kerin Salen, MD;  Location: [facility];  Service: Orthopedics;  Laterality: Right;  DEPUY/ PENNACLE POLY OR CERAMIC    Family History  Problem Relation Age of Onset   Heart disease Mother    Cancer Father 82       brain    Social History   Socioeconomic History   Marital status: Married    Spouse name: Not on file   Number of children: Not on file   Years of education: Not on file   Highest education level: Not on file  Occupational History   Occupation: retired    Fish farm manager: RETIRED  Tobacco Use   Smoking status: Never   Smokeless tobacco: Never  Substance and Sexual Activity   Alcohol use: Yes    Alcohol/week: 1.0 standard drink of alcohol    Types: 1 Glasses of wine per week   Drug use: Yes    Types: Hydrocodone   Sexual activity: Yes    Partners: Male  Other Topics Concern   Not on file  Social History Narrative   Walking, yoga, pickle ball   Social Determinants of Health    Financial Resource Strain: Low Risk  (09/13/2021)   Overall Financial Resource Strain (CARDIA)    Difficulty of Paying Living Expenses: Not hard at all  Food Insecurity: No Food Insecurity (09/13/2021)   Hunger Vital Sign    Worried About Running Out of Food in the Last Year: Never true    [place] in the Last Year: Never true  Transportation Needs: No Transportation Needs (09/13/2021)   PRAPARE - Hydrologist (Medical): No    Lack of Transportation (Non-Medical): No  Physical Activity: Sufficiently Active (09/13/2021)   Exercise Vital Sign    Days of Exercise per Week: 7 days    Minutes of Exercise per Session: 60 min  Stress: No Stress Concern Present (09/13/2021)   [place]    Feeling of Stress : Not at all  Social Connections: Moderately Integrated (09/13/2021)   Social Connection and Isolation Panel [NHANES]    Frequency of Communication with Friends and Family: More than three times a week    Frequency of Social Gatherings with Friends and Family: More than three times a week    Attends Religious Services: More than  4 times per year    Active Member of Clubs or Organizations: No    Attends Archivist Meetings: Never    Marital Status: Married  Human resources officer Violence: Not At Risk (09/13/2021)   Humiliation, Afraid, Rape, and Kick questionnaire    Fear of Current or Ex-Partner: No    Emotionally Abused: No    Physically Abused: No    Sexually Abused: No    Outpatient Medications Prior to Visit  Medication Sig Dispense Refill   alendronate (FOSAMAX) 70 MG tablet Take 1 tablet (70 mg total) by mouth every 7 (seven) days. Take with a full glass of water on an empty stomach. 12 tablet 3   atorvastatin (LIPITOR) 40 MG tablet TAKE 1 TABLET BY MOUTH EVERY DAY 90 tablet 1   Multiple Vitamins-Minerals (PRESERVISION AREDS 2+MULTI VIT PO) Take 2 tablets by mouth daily.      omeprazole (PRILOSEC) 20 MG capsule Take 1 capsule (20 mg total) by mouth daily. 90 capsule 3   celecoxib (CELEBREX) 200 MG capsule TAKE 1 CAPSULE BY MOUTH EVERY DAY 90 capsule 1   Diclofenac Sodium (PENNSAID) 2 % SOLN 2 pumps per knee bid 112 g 1   zolpidem (AMBIEN) 5 MG tablet Take 1 tablet (5 mg total) by mouth at bedtime. 90 tablet 1   No facility-administered medications prior to visit.    Allergies  Allergen Reactions   Niacin     REACTION: Nausea    Review of Systems  Constitutional:  Negative for fever and malaise/fatigue.  HENT:  Negative for congestion.   Eyes:  Negative for blurred vision.  Respiratory:  Negative for shortness of breath.   Cardiovascular:  Negative for chest pain, palpitations and leg swelling.  Gastrointestinal:  Negative for abdominal pain, blood in stool and nausea.  Genitourinary:  Negative for dysuria and frequency.  Musculoskeletal:  Positive for back pain (lower) and joint pain (right hip). Negative for falls.  Skin:  Negative for rash.  Neurological:  Negative for dizziness, loss of consciousness and headaches.  Endo/Heme/Allergies:  Negative for environmental allergies.  Psychiatric/Behavioral:  Negative for depression. The patient is not nervous/anxious.        Objective:    Physical Exam Vitals and nursing note reviewed.  Constitutional:      General: She is not in acute distress.    Appearance: Normal appearance.  HENT:     Head: Normocephalic and atraumatic.     Right Ear: External ear normal.     Left Ear: External ear normal.  Eyes:     Extraocular Movements: Extraocular movements intact.     Pupils: Pupils are equal, round, and reactive to light.  Cardiovascular:     Rate and Rhythm: Normal rate and regular rhythm.     Heart sounds: Murmur (1/5 to 2/5) heard.     No gallop.  Pulmonary:     Effort: Pulmonary effort is normal. No respiratory distress.     Breath sounds: Normal breath sounds. No wheezing or rales.   Musculoskeletal:     Cervical back: Normal range of motion.  Skin:    General: Skin is warm.  Neurological:     Mental Status: She is alert and oriented to person, place, and time.  Psychiatric:        Judgment: Judgment normal.     BP 118/70 (BP Location: Left Arm, Patient Position: Sitting, Cuff Size: Normal)   Pulse 79   Temp 97.6 F (36.4 C) (Oral)   Resp 18  Ht 5\' 4"  (1.626 m)   Wt 138 lb (62.6 kg)   SpO2 94%   BMI 23.69 kg/m  Wt Readings from Last 3 Encounters:  09/26/22 138 lb (62.6 kg)  04/05/22 134 lb 2 oz (60.8 kg)  01/07/22 134 lb 12.8 oz (61.1 kg)       Assessment & Plan:  Pre-op examination Assessment & Plan: EKG ---  no change from previous  Pre op labs to be done at [facility]  Otherwise pt cleared for surgery   Orders: -     EKG 12-Lead -     EKG 12-Lead -     CBC with Differential/Platelet  Insomnia, unspecified type -     Zolpidem Tartrate; Take 1 tablet (5 mg total) by mouth at bedtime.  Dispense: 90 tablet; Refill: 1  Hyperlipidemia, unspecified hyperlipidemia type Assessment & Plan: Encourage heart healthy diet such as MIND or DASH diet, increase exercise, avoid trans fats, simple carbohydrates and processed foods, consider a krill or fish or flaxseed oil cap daily.    Orders: -     Comprehensive metabolic panel -     Lipid panel -     TSH  Hypothyroidism, unspecified type Assessment & Plan: Lab Results  Component Value Date   TSH 4.64 01/07/2022   Stable  Con't synthroid   Orders: -     TSH  Hyperlipidemia LDL goal <100 Assessment & Plan: Encourage heart healthy diet such as MIND or DASH diet, increase exercise, avoid trans fats, simple carbohydrates and processed foods, consider a krill or fish or flaxseed oil cap daily.    Orders: -     Comprehensive metabolic panel -     Lipid panel  Left hip pain Assessment & Plan: Pt cleared for hip replacement      I, Ann Held, DO, personally preformed the services  described in this documentation.  All medical record entries made by the scribe were at my direction and in my presence.  I have reviewed the chart and discharge instructions (if applicable) and agree that the record reflects my personal performance and is accurate and complete. 09/26/2022  Ann Held, DO   I,Kennedy C Lynn,acting as a scribe for Ann Held, DO.,have documented all relevant documentation on the behalf of Ann Held, DO,as directed by  Ann Held, DO while in the presence of Ann Held, DO.

## 2022-09-26 NOTE — Assessment & Plan Note (Signed)
EKG ---  no change from previous  Pre op labs to be done at [facility]  Otherwise pt cleared for surgery

## 2022-09-26 NOTE — Assessment & Plan Note (Addendum)
Lab Results  Component Value Date   TSH 4.64 01/07/2022   Stable  Con't synthroid

## 2022-10-05 ENCOUNTER — Telehealth: Payer: Self-pay

## 2022-10-05 NOTE — Telephone Encounter (Signed)
Preop, OV, and EKG faxed.

## 2022-10-05 NOTE — Telephone Encounter (Signed)
Sherri with [facility] called to follow up on pre op clearance. Advised it was faxed over this morning. Sherri did not see it in her fax and said sometimes they go to the surgical facility so she requested documents be refaxed to [phone] please

## 2022-10-05 NOTE — Telephone Encounter (Signed)
Faxed again at the number provided below

## 2022-10-07 ENCOUNTER — Other Ambulatory Visit: Payer: Self-pay | Admitting: Family Medicine

## 2022-10-07 DIAGNOSIS — K219 Gastro-esophageal reflux disease without esophagitis: Secondary | ICD-10-CM

## 2022-10-20 NOTE — Progress Notes (Signed)
Sent message, via epic in basket, requesting orders in epic from surgeon.  

## 2022-10-24 DIAGNOSIS — M160 Bilateral primary osteoarthritis of hip: Secondary | ICD-10-CM | POA: Diagnosis not present

## 2022-10-25 ENCOUNTER — Encounter: Payer: Medicare HMO | Admitting: Family Medicine

## 2022-10-25 ENCOUNTER — Ambulatory Visit: Payer: Medicare HMO

## 2022-10-25 NOTE — Patient Instructions (Signed)
SURGICAL WAITING ROOM VISITATION Patients having surgery or a procedure may have no more than 2 support people in the waiting area - these visitors may rotate in the visitor waiting room.   Due to an increase in RSV and influenza rates and associated hospitalizations, children ages 29 and under may not visit patients in [facility] hospitals. If the patient needs to stay at the hospital during part of their recovery, the visitor guidelines for inpatient rooms apply.  PRE-OP VISITATION  Pre-op nurse will coordinate an appropriate time for 1 support person to accompany the patient in pre-op.  This support person may not rotate.  This visitor will be contacted when the time is appropriate for the visitor to come back in the pre-op area.  Please refer to the [facility] website for the visitor guidelines for Inpatients (after your surgery is over and you are in a regular room).  You are not required to quarantine at this time prior to your surgery. However, you must do this: Hand Hygiene often Do NOT share personal items Notify your provider if you are in close contact with someone who has COVID or you develop fever 100.4 or greater, new onset of sneezing, cough, sore throat, shortness of breath or body aches.  If you test positive for Covid or have been in contact with anyone that has tested positive in the last 10 days please notify you surgeon.    Your procedure is scheduled on:  Tuesday  Nov 08, 2022  Report to [facility] Main Entrance: [address] entrance where the [employer] is available.   Report to admitting at:  09:00   AM  +++++Call this number if you have any questions or problems the morning of surgery [phone]  Do not eat food after Midnight the night prior to your surgery/procedure.  After Midnight you may have the following liquids until  08:30 AM DAY OF SURGERY  Clear Liquid Diet Water Black Coffee (sugar ok, NO MILK/CREAM OR CREAMERS)  Tea (sugar ok, NO  MILK/CREAM OR CREAMERS) regular and decaf                             Plain Jell-O  with no fruit (NO RED)                                           Fruit ices (not with fruit pulp, NO RED)                                     Popsicles (NO RED)                                                                  Juice: apple, WHITE grape, WHITE cranberry Sports drinks like Gatorade or Powerade (NO RED)                   The day of surgery:  Drink ONE (1) Pre-Surgery Clear Ensure at 08:30  AM the morning of surgery. Drink in one sitting. Do  not sip.  This drink was given to you during your hospital pre-op appointment visit. Nothing else to drink after completing the Pre-Surgery Clear Ensure : No candy, chewing gum or throat lozenges.    FOLLOW  ANY ADDITIONAL PRE OP INSTRUCTIONS YOU RECEIVED FROM YOUR SURGEON'S OFFICE!!!   Oral Hygiene is also important to reduce your risk of infection.        Remember - BRUSH YOUR TEETH THE MORNING OF SURGERY WITH YOUR REGULAR TOOTHPASTE  Do NOT smoke after Midnight the night before surgery.  Take ONLY these medicines the morning of surgery with A SIP OF WATER: omeprazole. You may take Tylenol if needed for pain. You may use your Systane eye drops   You may not have any metal on your body including hair pins, jewelry, and body piercing  Do not wear make-up, lotions, powders, perfumes or deodorant  Do not wear nail polish including gel and S&S, artificial / acrylic nails, or any other type of covering on natural nails including finger and toenails. If you have artificial nails, gel coating, etc., that needs to be removed by a nail salon, Please have this removed prior to surgery. Not doing so may mean that your surgery could be cancelled or delayed if the Surgeon or anesthesia staff feels like they are unable to monitor you safely.   Do not shave 48 hours prior to surgery to avoid nicks in your skin which may contribute to postoperative infections.    Contacts, Hearing Aids, dentures or bridgework may not be worn into surgery. DENTURES WILL BE REMOVED PRIOR TO SURGERY PLEASE DO NOT APPLY "Poly grip" OR ADHESIVES!!!  Patients discharged on the day of surgery will not be allowed to drive home.  Someone NEEDS to stay with you for the first 24 hours after anesthesia.  Do not bring your home medications to the hospital. The Pharmacy will dispense medications listed on your medication list to you during your admission in the Hospital.  Special Instructions: Bring a copy of your healthcare power of attorney and living will documents the day of surgery, if you wish to have them scanned into your [facility] Records- EPIC  Please read over the following fact sheets you were given: IF YOU HAVE QUESTIONS ABOUT YOUR PRE-OP INSTRUCTIONS, PLEASE CALL [phone].   +++++++ PLEASE FOLLOW THE ATTACHED INFORMATION REGARDING SHOWERING / BATHING SCHEDULE  PRIOR TO YOUR SURGERY. Start this schedule on :  Friday Nov 04, 2022   ON THE DAY OF SURGERY : Do not apply any lotions/deodorants the morning of surgery.  Please wear clean clothes to the hospital/surgery center.    FAILURE TO FOLLOW THESE INSTRUCTIONS MAY RESULT IN THE CANCELLATION OF YOUR SURGERY  PATIENT SIGNATURE_________________________________  NURSE SIGNATURE__________________________________  ________________________________________________________________________        Rogelia Mire    An incentive spirometer is a tool that can help keep your lungs clear and active. This tool measures how well you are filling your lungs with each breath. Taking long deep breaths may help reverse or decrease the chance of developing breathing (pulmonary) problems (especially infection) following: A long period of time when you are unable to move or be active. BEFORE THE PROCEDURE  If the spirometer includes an indicator to show your best effort, your nurse or respiratory therapist  will set it to a desired goal. If possible, sit up straight or lean slightly forward. Try not to slouch. Hold the incentive spirometer in an upright position. INSTRUCTIONS FOR USE  Sit on the  edge of your bed if possible, or sit up as far as you can in bed or on a chair. Hold the incentive spirometer in an upright position. Breathe out normally. Place the mouthpiece in your mouth and seal your lips tightly around it. Breathe in slowly and as deeply as possible, raising the piston or the ball toward the top of the column. Hold your breath for 3-5 seconds or for as long as possible. Allow the piston or ball to fall to the bottom of the column. Remove the mouthpiece from your mouth and breathe out normally. Rest for a few seconds and repeat Steps 1 through 7 at least 10 times every 1-2 hours when you are awake. Take your time and take a few normal breaths between deep breaths. The spirometer may include an indicator to show your best effort. Use the indicator as a goal to work toward during each repetition. After each set of 10 deep breaths, practice coughing to be sure your lungs are clear. If you have an incision (the cut made at the time of surgery), support your incision when coughing by placing a pillow or rolled up towels firmly against it. Once you are able to get out of bed, walk around indoors and cough well. You may stop using the incentive spirometer when instructed by your caregiver.  RISKS AND COMPLICATIONS Take your time so you do not get dizzy or light-headed. If you are in pain, you may need to take or ask for pain medication before doing incentive spirometry. It is harder to take a deep breath if you are having pain. AFTER USE Rest and breathe slowly and easily. It can be helpful to keep track of a log of your progress. Your caregiver can provide you with a simple table to help with this. If you are using the spirometer at home, follow these instructions: SEEK MEDICAL CARE IF:   You are having difficultly using the spirometer. You have trouble using the spirometer as often as instructed. Your pain medication is not giving enough relief while using the spirometer. You develop fever of 100.5 F (38.1 C) or higher.                                                                                                    SEEK IMMEDIATE MEDICAL CARE IF:  You cough up bloody sputum that had not been present before. You develop fever of 102 F (38.9 C) or greater. You develop worsening pain at or near the incision site. MAKE SURE YOU:  Understand these instructions. Will watch your condition. Will get help right away if you are not doing well or get worse. Document Released: 10/31/2006 Document Revised: 09/12/2011 Document Reviewed: 01/01/2007 [facility] Patient Information 2014 [place], [place].

## 2022-10-25 NOTE — Progress Notes (Signed)
COVID Vaccine received:   No  Yes Date of any COVID positive Test in last 90 days:  PCP - Florina Ou, MD  clearance on chart Cardiologist -   Chest x-ray -  EKG -  09-26-22  On chart and Epic Stress Test -  ECHO - 07-08-2019  Epic Cardiac Cath -   PCR screen:  Ordered & Completed             No Order but Needs PROFEND             N/A for this surgery  Surgery Plan:   Ambulatory                             Outpatient in bed                             Admit  Anesthesia:     General   Spinal                             Choice   MAC  Pacemaker / ICD device  No  Yes   Spinal Cord Stimulator:[x]  No  Yes       History of Sleep Apnea?  No  Yes   CPAP used?-  No  Yes    Does the patient monitor blood sugar?           No  Yes   N/A  Patient has:  NO Hx DM    Pre-DM                  DM1    DM2 Does patient have a [employer] or Dexacom?  No  Yes   Fasting Blood Sugar Ranges-  Checks Blood Sugar _____ times a day  Blood Thinner / Instructions: none Aspirin Instructions: none  ERAS Protocol Ordered:  No   Yes PRE-SURGERY  ENSURE   G2  Patient is to be NPO after: 08:30 am  Comments:   Activity level: Patient is able / unable to climb a flight of stairs without difficulty;  No CP   No SOB, but would have ___   Patient can / can not perform ADLs without assistance.   Anesthesia review: Macular degeneration, GERD, Cardiac Murmur, 1 diastolic dysfunction.   Patient denies shortness of breath, fever, cough and chest pain at PAT appointment.  Patient verbalized understanding and agreement to the Pre-Surgical Instructions that were given to them at this PAT appointment. Patient was also educated of the need to review these PAT instructions again prior to her surgery.I reviewed the appropriate phone numbers to call if they have any and questions or concerns.

## 2022-10-27 ENCOUNTER — Other Ambulatory Visit: Payer: Self-pay

## 2022-10-27 ENCOUNTER — Encounter (HOSPITAL_COMMUNITY): Payer: Self-pay

## 2022-10-27 ENCOUNTER — Encounter (HOSPITAL_COMMUNITY)
Admission: RE | Admit: 2022-10-27 | Discharge: 2022-10-27 | Disposition: A | Discharge status: Home / Self Care | Payer: Medicare HMO | Source: Ambulatory Visit | Attending: Orthopedic Surgery | Admitting: Orthopedic Surgery

## 2022-10-27 VITALS — BP 149/73 | HR 68 | Temp 98.2°F | Resp 18 | Ht 65.0 in | Wt 139.0 lb

## 2022-10-27 DIAGNOSIS — Z01812 Encounter for preprocedural laboratory examination: Secondary | ICD-10-CM | POA: Insufficient documentation

## 2022-10-27 DIAGNOSIS — R011 Cardiac murmur, unspecified: Secondary | ICD-10-CM | POA: Insufficient documentation

## 2022-10-27 DIAGNOSIS — I2510 Atherosclerotic heart disease of native coronary artery without angina pectoris: Secondary | ICD-10-CM | POA: Insufficient documentation

## 2022-10-27 DIAGNOSIS — Z01818 Encounter for other preprocedural examination: Secondary | ICD-10-CM

## 2022-10-27 LAB — BASIC METABOLIC PANEL
Anion gap: 10 (ref 5–15)
BUN: 20 mg/dL (ref 8–23)
CO2: 25 mmol/L (ref 22–32)
Calcium: 9.2 mg/dL (ref 8.9–10.3)
Chloride: 104 mmol/L (ref 98–111)
Creatinine, Ser: 0.73 mg/dL (ref 0.44–1.00)
GFR, Estimated: >60 mL/min (ref >60)
Glucose, Bld: 97 mg/dL (ref 70–99)
Potassium: 4.8 mmol/L (ref 3.5–5.1)
Sodium: 139 mmol/L (ref 135–145)

## 2022-10-27 LAB — CBC
HCT: 43.1 % (ref 36.0–46.0)
Hemoglobin: 14.1 g/dL (ref 12.0–15.0)
MCH: 33.5 pg (ref 26.0–34.0)
MCHC: 32.7 g/dL (ref 30.0–36.0)
MCV: 102.4 fL — ABNORMAL HIGH (ref 80.0–100.0)
Platelets: 268 10*3/uL (ref 150–400)
RBC: 4.21 MIL/uL (ref 3.87–5.11)
RDW: 13.5 % (ref 11.5–15.5)
WBC: 9.5 10*3/uL (ref 4.0–10.5)
nRBC: 0 % (ref 0.0–0.2)

## 2022-10-27 LAB — SURGICAL PCR SCREEN
MRSA, PCR: NEGATIVE
Staphylococcus aureus: NEGATIVE

## 2022-11-04 NOTE — Care Plan (Signed)
Ortho Bundle Case Management Note  Patient Details  Name: Joanna Williams MRN: [id number] Date of Birth: 04-10-1941  met with patient in the office for H&P. will discharge to home with family to assist. has RW at home. discharge instructions discussed and questions answered. appointments confirmed. Patient and MD in agreement with plan. Choice offered.                     DME Arranged:    DME Agency:     HH Arranged:  PT HH Agency:  [facility]  Additional Comments: Please contact me with any questions of if this plan should need to change.  Shauna Hugh,  RN,BSN,MHA,CCM  [facility]  [phone] 11/04/2022, 1:32 PM

## 2022-11-07 NOTE — H&P (Signed)
HIP ARTHROPLASTY ADMISSION H&P  Patient ID: AKEELAH EINBINDER MRN: [id number] DOB/AGE: 1940-08-05 82 y.o.  Chief Complaint: left hip pain.  Planned Procedure Date: 11/08/22 Medical Clearance by Myrene Buddy Case   HPI: EVALIN SAGRERO is a 82 y.o. female who presents for evaluation of DJD left hip. The patient has a history of pain and functional disability in the left hip due to arthritis and has failed non-surgical conservative treatments for greater than 12 weeks to include NSAID's and/or analgesics, corticosteriod injections, and activity modification.  Onset of symptoms was abrupt, starting 1 years ago with rapidlly worsening course since that time. The patient noted no past surgery on the left hip.  Patient currently rates pain at 8 out of 10 with activity. Patient has worsening of pain with activity and weight bearing and pain that interferes with activities of daily living.  Patient has evidence of joint space narrowing by imaging studies.  There is no active infection.  Past Medical History:  Diagnosis Date   GERD (gastroesophageal reflux disease)    Hyperlipidemia    Macular degeneration 05/04/2017   Past Surgical History:  Procedure Laterality Date   COLONOSCOPY  2 yrs ago   EYE SURGERY Bilateral 2014   cataract   TOTAL HIP ARTHROPLASTY  08/10/2011   Procedure: TOTAL HIP ARTHROPLASTY;  Surgeon: Nestor Lewandowsky, MD;  Location: [facility] OR;  Service: Orthopedics;  Laterality: Right;  DEPUY/ PENNACLE POLY OR CERAMIC   Allergies  Allergen Reactions   Niacin Nausea Only   Prior to Admission medications   Medication Sig Start Date End Date Taking? Authorizing Provider  acetaminophen (TYLENOL) 650 MG CR tablet Take 1,300 mg by mouth every 8 (eight) hours as needed for pain.   Yes [provider]  alendronate (FOSAMAX) 70 MG tablet Take 1 tablet (70 mg total) by mouth every 7 (seven) days. Take with a full glass of water on an empty stomach. 01/07/22  Yes Seabron Spates R, DO   atorvastatin (LIPITOR) 40 MG tablet TAKE 1 TABLET BY MOUTH EVERY DAY 09/09/22  Yes Seabron Spates R, DO  ibuprofen (ADVIL) 200 MG tablet Take 400 mg by mouth every 6 (six) hours as needed for moderate pain.   Yes [provider]  Multiple Vitamins-Minerals (PRESERVISION AREDS 2+MULTI VIT PO) Take 2 tablets by mouth at bedtime.   Yes [provider]  omeprazole (PRILOSEC) 20 MG capsule TAKE 1 CAPSULE BY MOUTH EVERY DAY 10/07/22  Yes Seabron Spates R, DO  Propylene Glycol (SYSTANE COMPLETE) 0.6 % SOLN Place 1 drop into both eyes daily as needed (dry eyes).   Yes [provider]  trolamine salicylate (ASPERCREME) 10 % cream Apply 1 Application topically as needed for muscle pain.   Yes [provider]  zolpidem (AMBIEN) 5 MG tablet Take 1 tablet (5 mg total) by mouth at bedtime. 09/26/22  Yes Donato Schultz, DO   Social History   Socioeconomic History   Marital status: Married    Spouse name: Not on file   Number of children: Not on file   Years of education: Not on file   Highest education level: Not on file  Occupational History   Occupation: retired    Associate Professor: RETIRED  Tobacco Use   Smoking status: Never   Smokeless tobacco: Never  Vaping Use   Vaping Use: Never used  Substance and Sexual Activity   Alcohol use: Yes    Alcohol/week: 1.0 standard drink of alcohol  Types: 1 Glasses of wine per week    Comment: rare   Drug use: Yes    Types: Hydrocodone   Sexual activity: Yes    Partners: Male  Other Topics Concern   Not on file  Social History Narrative   Walking, yoga, pickle ball   Social Determinants of Health   Financial Resource Strain: Low Risk  (09/13/2021)   Overall Financial Resource Strain (CARDIA)    Difficulty of Paying Living Expenses: Not hard at all  Food Insecurity: No Food Insecurity (09/13/2021)   Hunger Vital Sign    Worried About Running Out of Food in the Last Year: Never true    Ran Out of Food in  the Last Year: Never true  Transportation Needs: No Transportation Needs (09/13/2021)   PRAPARE - Administrator, Civil Service (Medical): No    Lack of Transportation (Non-Medical): No  Physical Activity: Sufficiently Active (09/13/2021)   Exercise Vital Sign    Days of Exercise per Week: 7 days    Minutes of Exercise per Session: 60 min  Stress: No Stress Concern Present (09/13/2021)   [employer] of Occupational Health - Occupational Stress Questionnaire    Feeling of Stress : Not at all  Social Connections: Moderately Integrated (09/13/2021)   Social Connection and Isolation Panel [NHANES]    Frequency of Communication with Friends and Family: More than three times a week    Frequency of Social Gatherings with Friends and Family: More than three times a week    Attends Religious Services: More than 4 times per year    Active Member of [employer] or Organizations: No    Attends Banker Meetings: Never    Marital Status: Married   Family History  Problem Relation Age of Onset   Heart disease Mother    Cancer Father 30       brain    ROS: Currently denies lightheadedness, dizziness, Fever, chills, CP, SOB.   No personal history of DVT, PE, MI, or CVA. No loose teeth or dentures All other systems have been reviewed and were otherwise currently negative with the exception of those mentioned in the HPI and as above.  Objective: Vitals: HT: 5\' 5"   WT: 138.8 T: 97.9  BP 128/70  P: 84  O2 SAT: unable to be taken as our machine is broken.  Physical Exam: General: Alert, NAD. Trendelenberg Gait  HEENT: EOMI, Good Neck Extension  Pulm: No increased work of breathing.  Clear B/L A/P w/o crackle or wheeze.  CV: RRR, No m/g/r appreciated  GI: soft, NT, ND Neuro: Neuro without gross focal deficit.  Sensation intact distally Skin: No lesions in the area of chief complaint MSK/Surgical Site: She has 0-90 degrees range of forward  flexion at the left hip which causes  pain. Inability to actively internally rotate beyond neutral. Pain elicited with passive internal and external rotation. Dorsiflexion and plantarflexion intact to the ankle. Distal sensation intact. There is a 2+ PT pulse.   Imaging Review X-rays were reviewed in [facility] taken 09-22-22 and reviewed by me show severe left hip osteoarthritis.    The bone quality appears to be adequate for age and reported activity level.  Preoperative templating of the joint replacement has been completed, documented, and submitted to the Operating Room personnel in order to optimize intra-operative equipment management.  Assessment: DJD left hip   Plan: Plan for Procedure(s): TOTAL HIP ARTHROPLASTY  The patient history, physical exam, clinical judgement of the provider  and imaging are consistent with end stage degenerative joint disease and total joint arthroplasty is deemed medically necessary. The treatment options including medical management, injection therapy, and arthroplasty were discussed at length. The risks and benefits of Procedure(s): TOTAL HIP ARTHROPLASTY were presented and reviewed.  The risks of nonoperative treatment, versus surgical intervention including but not limited to continued pain, aseptic loosening, stiffness, dislocation/subluxation, infection, bleeding, nerve injury, blood clots, cardiopulmonary complications, morbidity, mortality, among others were discussed. The patient verbalizes understanding and wishes to proceed with the plan.  Patient is being admitted for surgery, pain control, PT, prophylactic antibiotics, VTE prophylaxis, progressive ambulation, ADL's and discharge planning.   Dental prophylaxis discussed and recommended for 1 years postoperatively.  The patient does meet the criteria for TXA which will be used perioperatively.   ASA 325 mg will be used postoperatively for DVT prophylaxis in addition to SCDs, and early ambulation. The patient is planning to be discharged  home with OPPT in care of husband   Armida Sans, Cordelia Poche 11/07/2022 1:37 PM

## 2022-11-08 ENCOUNTER — Ambulatory Visit (HOSPITAL_COMMUNITY): Payer: Medicare HMO | Admitting: Physician Assistant

## 2022-11-08 ENCOUNTER — Ambulatory Visit (HOSPITAL_COMMUNITY): Payer: Medicare HMO

## 2022-11-08 ENCOUNTER — Encounter (HOSPITAL_COMMUNITY)
Admission: RE | Disposition: A | Discharge status: Home / Self Care | Payer: Self-pay | Source: Home / Self Care | Attending: Orthopedic Surgery

## 2022-11-08 ENCOUNTER — Ambulatory Visit (HOSPITAL_BASED_OUTPATIENT_CLINIC_OR_DEPARTMENT_OTHER): Payer: Medicare HMO | Admitting: Certified Registered"

## 2022-11-08 ENCOUNTER — Encounter (HOSPITAL_COMMUNITY): Payer: Self-pay | Admitting: Orthopedic Surgery

## 2022-11-08 ENCOUNTER — Ambulatory Visit (HOSPITAL_COMMUNITY)
Admission: RE | Admit: 2022-11-08 | Discharge: 2022-11-08 | Disposition: A | Discharge status: Home / Self Care | Payer: Medicare HMO | Attending: Orthopedic Surgery | Admitting: Orthopedic Surgery

## 2022-11-08 DIAGNOSIS — E039 Hypothyroidism, unspecified: Secondary | ICD-10-CM

## 2022-11-08 DIAGNOSIS — K219 Gastro-esophageal reflux disease without esophagitis: Secondary | ICD-10-CM | POA: Insufficient documentation

## 2022-11-08 DIAGNOSIS — Z96642 Presence of left artificial hip joint: Secondary | ICD-10-CM | POA: Diagnosis not present

## 2022-11-08 DIAGNOSIS — M1612 Unilateral primary osteoarthritis, left hip: Secondary | ICD-10-CM | POA: Diagnosis not present

## 2022-11-08 DIAGNOSIS — Z471 Aftercare following joint replacement surgery: Secondary | ICD-10-CM | POA: Diagnosis not present

## 2022-11-08 HISTORY — PX: TOTAL HIP ARTHROPLASTY: SHX124

## 2022-11-08 SURGERY — ARTHROPLASTY, HIP, TOTAL,POSTERIOR APPROACH
Anesthesia: Spinal | Site: Hip | Laterality: Left

## 2022-11-08 MED ORDER — FENTANYL CITRATE (PF) 100 MCG/2ML IJ SOLN
INTRAMUSCULAR | Status: DC | PRN
  Administered 2022-11-08: 50 ug via INTRAVENOUS

## 2022-11-08 MED ORDER — OXYCODONE HCL 5 MG PO TABS
5.0000 mg | ORAL_TABLET | ORAL | Status: DC | PRN
  Administered 2022-11-08: 5 mg via ORAL

## 2022-11-08 MED ORDER — LACTATED RINGERS IV BOLUS: 250.0000 mL | Freq: Once | INTRAVENOUS | Status: DC

## 2022-11-08 MED ORDER — ONDANSETRON HCL 4 MG/2ML IJ SOLN: 4.0000 mg | Freq: Four times a day (QID) | INTRAMUSCULAR | Status: DC | PRN

## 2022-11-08 MED ORDER — POVIDONE-IODINE 10 % EX SWAB: 2.0000 | Freq: Once | CUTANEOUS | Status: DC

## 2022-11-08 MED ORDER — ORAL CARE MOUTH RINSE: 15.0000 mL | Freq: Once | OROMUCOSAL | Status: AC

## 2022-11-08 MED ORDER — CEFAZOLIN SODIUM-DEXTROSE 2-4 GM/100ML-% IV SOLN
2.0000 g | INTRAVENOUS | Status: AC
  Administered 2022-11-08: 2 g via INTRAVENOUS
  Filled 2022-11-08: qty 100

## 2022-11-08 MED ORDER — OXYCODONE HCL 5 MG PO TABS
ORAL_TABLET | ORAL | Status: AC
  Filled 2022-11-08: qty 1

## 2022-11-08 MED ORDER — KETOROLAC TROMETHAMINE 30 MG/ML IJ SOLN
INTRAMUSCULAR | Status: DC | PRN
  Administered 2022-11-08: 30 mg

## 2022-11-08 MED ORDER — LACTATED RINGERS IV BOLUS: 500.0000 mL | Freq: Once | INTRAVENOUS | Status: DC

## 2022-11-08 MED ORDER — EPHEDRINE SULFATE-NACL 50-0.9 MG/10ML-% IV SOSY
PREFILLED_SYRINGE | INTRAVENOUS | Status: DC | PRN
  Administered 2022-11-08 (×2): 5 mg via INTRAVENOUS

## 2022-11-08 MED ORDER — BUPIVACAINE HCL (PF) 0.25 % IJ SOLN
INTRAMUSCULAR | Status: DC | PRN
  Administered 2022-11-08: 30 mL

## 2022-11-08 MED ORDER — TRANEXAMIC ACID-NACL 1000-0.7 MG/100ML-% IV SOLN
1000.0000 mg | Freq: Once | INTRAVENOUS | Status: AC
  Administered 2022-11-08: 1000 mg via INTRAVENOUS

## 2022-11-08 MED ORDER — ACETAMINOPHEN 500 MG PO TABS
1000.0000 mg | ORAL_TABLET | Freq: Once | ORAL | Status: DC
  Filled 2022-11-08: qty 2

## 2022-11-08 MED ORDER — STERILE WATER FOR IRRIGATION IR SOLN
Status: DC | PRN
  Administered 2022-11-08: 2000 mL

## 2022-11-08 MED ORDER — PROPOFOL 1000 MG/100ML IV EMUL
INTRAVENOUS | Status: AC
  Filled 2022-11-08: qty 100

## 2022-11-08 MED ORDER — KETOROLAC TROMETHAMINE 30 MG/ML IJ SOLN
INTRAMUSCULAR | Status: AC
  Filled 2022-11-08: qty 1

## 2022-11-08 MED ORDER — FENTANYL CITRATE (PF) 100 MCG/2ML IJ SOLN
INTRAMUSCULAR | Status: AC
  Filled 2022-11-08: qty 2

## 2022-11-08 MED ORDER — OXYCODONE HCL 5 MG PO TABS: 5.0000 mg | ORAL_TABLET | ORAL | 0 refills | Status: DC | PRN

## 2022-11-08 MED ORDER — CHLORHEXIDINE GLUCONATE 0.12 % MT SOLN
15.0000 mL | Freq: Once | OROMUCOSAL | Status: AC
  Administered 2022-11-08: 15 mL via OROMUCOSAL

## 2022-11-08 MED ORDER — PHENYLEPHRINE HCL-NACL 20-0.9 MG/250ML-% IV SOLN
INTRAVENOUS | Status: DC | PRN
  Administered 2022-11-08: 20 ug/min via INTRAVENOUS

## 2022-11-08 MED ORDER — 0.9 % SODIUM CHLORIDE (POUR BTL) OPTIME
TOPICAL | Status: DC | PRN
  Administered 2022-11-08: 1000 mL

## 2022-11-08 MED ORDER — METOCLOPRAMIDE HCL 5 MG/ML IJ SOLN: 5.0000 mg | Freq: Three times a day (TID) | INTRAMUSCULAR | Status: DC | PRN

## 2022-11-08 MED ORDER — FENTANYL CITRATE PF 50 MCG/ML IJ SOSY: 25.0000 ug | PREFILLED_SYRINGE | INTRAMUSCULAR | Status: DC | PRN

## 2022-11-08 MED ORDER — METOCLOPRAMIDE HCL 5 MG PO TABS: 5.0000 mg | ORAL_TABLET | Freq: Three times a day (TID) | ORAL | Status: DC | PRN

## 2022-11-08 MED ORDER — ONDANSETRON HCL 4 MG/2ML IJ SOLN
INTRAMUSCULAR | Status: AC
  Filled 2022-11-08: qty 2

## 2022-11-08 MED ORDER — TRANEXAMIC ACID-NACL 1000-0.7 MG/100ML-% IV SOLN
1000.0000 mg | INTRAVENOUS | Status: AC
  Administered 2022-11-08: 1000 mg via INTRAVENOUS
  Filled 2022-11-08: qty 100

## 2022-11-08 MED ORDER — PROPOFOL 10 MG/ML IV BOLUS
INTRAVENOUS | Status: DC | PRN
  Administered 2022-11-08: 10 mg via INTRAVENOUS

## 2022-11-08 MED ORDER — LACTATED RINGERS IV SOLN: INTRAVENOUS | Status: DC

## 2022-11-08 MED ORDER — PHENYLEPHRINE 80 MCG/ML (10ML) SYRINGE FOR IV PUSH (FOR BLOOD PRESSURE SUPPORT)
PREFILLED_SYRINGE | INTRAVENOUS | Status: DC | PRN
  Administered 2022-11-08: 80 ug via INTRAVENOUS

## 2022-11-08 MED ORDER — OXYCODONE HCL 5 MG PO TABS: 10.0000 mg | ORAL_TABLET | ORAL | Status: DC | PRN

## 2022-11-08 MED ORDER — BUPIVACAINE HCL (PF) 0.5 % IJ SOLN
INTRAMUSCULAR | Status: DC | PRN
  Administered 2022-11-08: 2.5 mL

## 2022-11-08 MED ORDER — ONDANSETRON HCL 4 MG PO TABS: 4.0000 mg | ORAL_TABLET | Freq: Four times a day (QID) | ORAL | Status: DC | PRN

## 2022-11-08 MED ORDER — PROPOFOL 500 MG/50ML IV EMUL
INTRAVENOUS | Status: DC | PRN
  Administered 2022-11-08: 50 ug/kg/min via INTRAVENOUS

## 2022-11-08 MED ORDER — CEFAZOLIN SODIUM-DEXTROSE 2-4 GM/100ML-% IV SOLN
INTRAVENOUS | Status: AC
  Filled 2022-11-08: qty 100

## 2022-11-08 MED ORDER — BUPIVACAINE HCL 0.25 % IJ SOLN
INTRAMUSCULAR | Status: AC
  Filled 2022-11-08: qty 1

## 2022-11-08 MED ORDER — ONDANSETRON HCL 4 MG PO TABS: 4.0000 mg | ORAL_TABLET | Freq: Three times a day (TID) | ORAL | 0 refills | Status: DC | PRN

## 2022-11-08 MED ORDER — ONDANSETRON HCL 4 MG/2ML IJ SOLN
INTRAMUSCULAR | Status: DC | PRN
  Administered 2022-11-08: 4 mg via INTRAVENOUS

## 2022-11-08 MED ORDER — TRANEXAMIC ACID-NACL 1000-0.7 MG/100ML-% IV SOLN
INTRAVENOUS | Status: AC
  Filled 2022-11-08: qty 100

## 2022-11-08 MED ORDER — CEFAZOLIN SODIUM-DEXTROSE 2-4 GM/100ML-% IV SOLN
2.0000 g | Freq: Four times a day (QID) | INTRAVENOUS | Status: DC
  Administered 2022-11-08: 2 g via INTRAVENOUS

## 2022-11-08 MED ORDER — POVIDONE-IODINE 7.5 % EX SOLN: Freq: Once | CUTANEOUS | Status: DC

## 2022-11-08 MED ORDER — ASPIRIN 325 MG PO TBEC: 325.0000 mg | DELAYED_RELEASE_TABLET | Freq: Two times a day (BID) | ORAL | 0 refills | Status: DC

## 2022-11-08 MED ORDER — SENNA-DOCUSATE SODIUM 8.6-50 MG PO TABS: 2.0000 | ORAL_TABLET | Freq: Every day | ORAL | 1 refills | Status: DC

## 2022-11-08 SURGICAL SUPPLY — 62 items
BAG COUNTER SPONGE SURGICOUNT (BAG) IMPLANT
BAG SPNG CNTER NS LX DISP (BAG) ×1
BIT DRILL 2.0X128 (BIT) ×2 IMPLANT
BLADE SAW SGTL 73X25 THK (BLADE) ×2 IMPLANT
CLSR STERI-STRIP ANTIMIC 1/2X4 (GAUZE/BANDAGES/DRESSINGS) ×4 IMPLANT
COVER SURGICAL LIGHT HANDLE (MISCELLANEOUS) ×2 IMPLANT
CUP SECTOR GRIPTON 50MM (Cup) IMPLANT
DRAPE INCISE IOBAN 66X45 STRL (DRAPES) ×2 IMPLANT
DRAPE ORTHO SPLIT 77X108 STRL (DRAPES) ×2
DRAPE POUCH INSTRU U-SHP 10X18 (DRAPES) ×2 IMPLANT
DRAPE SHEET LG 3/4 BI-LAMINATE (DRAPES) ×2 IMPLANT
DRAPE SURG 17X11 SM STRL (DRAPES) ×2 IMPLANT
DRAPE SURG ORHT 6 SPLT 77X108 (DRAPES) ×4 IMPLANT
DRAPE U-SHAPE 47X51 STRL (DRAPES) ×2 IMPLANT
DRESSING MEPILEX FLEX 4X4 (GAUZE/BANDAGES/DRESSINGS) IMPLANT
DRSG MEPILEX FLEX 4X4 (GAUZE/BANDAGES/DRESSINGS) ×1
DRSG MEPILEX POST OP 4X8 (GAUZE/BANDAGES/DRESSINGS) ×2 IMPLANT
DURAPREP 26ML APPLICATOR (WOUND CARE) ×4 IMPLANT
ELECT BLADE TIP CTD 4 INCH (ELECTRODE) ×2 IMPLANT
ELECT REM PT RETURN 15FT ADLT (MISCELLANEOUS) ×2 IMPLANT
ELIMINATOR HOLE APEX DEPUY (Hips) IMPLANT
FACESHIELD WRAPAROUND (MASK) ×2 IMPLANT
FACESHIELD WRAPAROUND OR TEAM (MASK) ×4 IMPLANT
GLOVE BIO SURGEON STRL SZ 6.5 (GLOVE) ×2 IMPLANT
GLOVE BIO SURGEON STRL SZ7.5 (GLOVE) ×2 IMPLANT
GLOVE BIOGEL PI IND STRL 7.0 (GLOVE) ×2 IMPLANT
GLOVE BIOGEL PI IND STRL 8 (GLOVE) ×2 IMPLANT
GOWN STRL SURGICAL XL XLNG (GOWN DISPOSABLE) ×4 IMPLANT
HEAD FEM STD 32X+5 STRL (Hips) IMPLANT
HOOD PEEL AWAY T7 (MISCELLANEOUS) ×6 IMPLANT
K-WIRE TROCAR PT 2.0 150MM (WIRE) ×1
KIT BASIN OR (CUSTOM PROCEDURE TRAY) ×2 IMPLANT
KIT TURNOVER KIT A (KITS) IMPLANT
KWIRE TROCAR PT 2.0 150 (WIRE) ×2 IMPLANT
KWIRE TROCAR PT 2.0 150MM (WIRE) ×1 IMPLANT
LINER ACET PNNCL PLUS4 NEUTRAL (Hips) IMPLANT
MANIFOLD NEPTUNE II (INSTRUMENTS) ×2 IMPLANT
NDL MA TROC 1/2 (NEEDLE) IMPLANT
NDL SAFETY ECLIP 18X1.5 (MISCELLANEOUS) ×4 IMPLANT
NEEDLE ANCHOR KEITH 2 7/8 STR (NEEDLE) ×2 IMPLANT
NEEDLE MA TROC 1/2 (NEEDLE) IMPLANT
NS IRRIG 1000ML POUR BTL (IV SOLUTION) ×2 IMPLANT
PACK TOTAL JOINT (CUSTOM PROCEDURE TRAY) ×2 IMPLANT
PINNACLE PLUS 4 NEUTRAL (Hips) ×1 IMPLANT
PROTECTOR NERVE ULNAR (MISCELLANEOUS) ×2 IMPLANT
SCREW 6.5MMX25MM (Screw) IMPLANT
SUCTION FRAZIER HANDLE 12FR (TUBING) ×1
SUCTION TUBE FRAZIER 12FR DISP (TUBING) ×2 IMPLANT
SUT ETHIBOND NAB CT1 #1 30IN (SUTURE) ×6 IMPLANT
SUT VIC AB 0 CT1 36 (SUTURE) ×2 IMPLANT
SUT VIC AB 1 CT1 36 (SUTURE) ×4 IMPLANT
SUT VIC AB 2-0 CT1 27 (SUTURE) ×3
SUT VIC AB 2-0 CT1 TAPERPNT 27 (SUTURE) ×6 IMPLANT
SUT VIC AB 3-0 SH 27 (SUTURE) ×2
SUT VIC AB 3-0 SH 27X BRD (SUTURE) ×4 IMPLANT
SUT VIC AB 3-0 SH 8-18 (SUTURE) ×2 IMPLANT
SYR CONTROL 10ML LL (SYRINGE) ×4 IMPLANT
TAP DUOFIX SZ5 STD OFF (Hips) IMPLANT
TOWEL OR 17X26 10 PK STRL BLUE (TOWEL DISPOSABLE) ×2 IMPLANT
TRAY FOLEY MTR SLVR 16FR STAT (SET/KITS/TRAYS/PACK) ×2 IMPLANT
TUBE SUCTION HIGH CAP CLEAR NV (SUCTIONS) ×2 IMPLANT
WATER STERILE IRR 1000ML POUR (IV SOLUTION) ×4 IMPLANT

## 2022-11-08 NOTE — Discharge Instructions (Signed)
INSTRUCTIONS AFTER JOINT REPLACEMENT  ° °Remove items at home which could result in a fall. This includes throw rugs or furniture in walking pathways °ICE to the affected joint every three hours while awake for 30 minutes at a time, for at least the first 3-5 days, and then as needed for pain and swelling.  Continue to use ice for pain and swelling. You may notice swelling that will progress down to the foot and ankle.  This is normal after surgery.  Elevate your leg when you are not up walking on it.   °Continue to use the breathing machine you got in the hospital (incentive spirometer) which will help keep your temperature down.  It is common for your temperature to cycle up and down following surgery, especially at night when you are not up moving around and exerting yourself.  The breathing machine keeps your lungs expanded and your temperature down. ° ° °DIET:  As you were doing prior to hospitalization, we recommend a well-balanced diet. ° °DRESSING / WOUND CARE / SHOWERING ° °You may shower 3 days after surgery, but keep the wounds dry during showering.  You may use an occlusive plastic wrap (Press'n Seal for example), NO SOAKING/SUBMERGING IN THE BATHTUB.  If the bandage gets wet, change with a clean dry gauze.  If the incision gets wet, pat the wound dry with a clean towel. ° °ACTIVITY ° °Increase activity slowly as tolerated, but follow the weight bearing instructions below.   °No driving for 6 weeks or until further direction given by your physician.  You cannot drive while taking narcotics.  °No lifting or carrying greater than 10 lbs. until further directed by your surgeon. °Avoid periods of inactivity such as sitting longer than an hour when not asleep. This helps prevent blood clots.  °You may return to work once you are authorized by your doctor.  ° ° ° °WEIGHT BEARING  ° °Weight bearing as tolerated with assist device (walker, cane, etc) as directed, use it as long as suggested by your surgeon or  therapist, typically at least 4-6 weeks. ° ° °EXERCISES ° °Results after joint replacement surgery are often greatly improved when you follow the exercise, range of motion and muscle strengthening exercises prescribed by your doctor. Safety measures are also important to protect the joint from further injury. Any time any of these exercises cause you to have increased pain or swelling, decrease what you are doing until you are comfortable again and then slowly increase them. If you have problems or questions, call your caregiver or physical therapist for advice.  ° °Rehabilitation is important following a joint replacement. After just a few days of immobilization, the muscles of the leg can become weakened and shrink (atrophy).  These exercises are designed to build up the tone and strength of the thigh and leg muscles and to improve motion. Often times heat used for twenty to thirty minutes before working out will loosen up your tissues and help with improving the range of motion but do not use heat for the first two weeks following surgery (sometimes heat can increase post-operative swelling).  ° °These exercises can be done on a training (exercise) mat, on the floor, on a table or on a bed. Use whatever works the best and is most comfortable for you.    Use music or television while you are exercising so that the exercises are a pleasant break in your day. This will make your life better with the exercises acting   as a break in your routine that you can look forward to.   Perform all exercises about fifteen times, three times per day or as directed.  You should exercise both the operative leg and the other leg as well. ° °Exercises include: °  °Quad Sets - Tighten up the muscle on the front of the thigh (Quad) and hold for 5-10 seconds.   °Straight Leg Raises - With your knee straight (if you were given a brace, keep it on), lift the leg to 60 degrees, hold for 3 seconds, and slowly lower the leg.  Perform this  exercise against resistance later as your leg gets stronger.  °Leg Slides: Lying on your back, slowly slide your foot toward your buttocks, bending your knee up off the floor (only go as far as is comfortable). Then slowly slide your foot back down until your leg is flat on the floor again.  °Angel Wings: Lying on your back spread your legs to the side as far apart as you can without causing discomfort.  °Hamstring Strength:  Lying on your back, push your heel against the floor with your leg straight by tightening up the muscles of your buttocks.  Repeat, but this time bend your knee to a comfortable angle, and push your heel against the floor.  You may put a pillow under the heel to make it more comfortable if necessary.  ° °A rehabilitation program following joint replacement surgery can speed recovery and prevent re-injury in the future due to weakened muscles. Contact your doctor or a physical therapist for more information on knee rehabilitation.  ° ° °CONSTIPATION ° °Constipation is defined medically as fewer than three stools per week and severe constipation as less than one stool per week.  Even if you have a regular bowel pattern at home, your normal regimen is likely to be disrupted due to multiple reasons following surgery.  Combination of anesthesia, postoperative narcotics, change in appetite and fluid intake all can affect your bowels.  ° °YOU MUST use at least one of the following options; they are listed in order of increasing strength to get the job done.  They are all available over the counter, and you may need to use some, POSSIBLY even all of these options:   ° °Drink plenty of fluids (prune juice may be helpful) and high fiber foods °Colace 100 mg by mouth twice a day  °Senokot for constipation as directed and as needed Dulcolax (bisacodyl), take with full glass of water  °Miralax (polyethylene glycol) once or twice a day as needed. ° °If you have tried all these things and are unable to have a  bowel movement in the first 3-4 days after surgery call either your surgeon or your primary doctor.   ° °If you experience loose stools or diarrhea, hold the medications until you stool forms back up.  If your symptoms do not get better within 1 week or if they get worse, check with your doctor.  If you experience "the worst abdominal pain ever" or develop nausea or vomiting, please contact the office immediately for further recommendations for treatment. ° ° °ITCHING:  If you experience itching with your medications, try taking only a single pain pill, or even half a pain pill at a time.  You can also use Benadryl over the counter for itching or also to help with sleep.  ° °TED HOSE STOCKINGS:  Use stockings on both legs until for at least 2 weeks or as directed by   physician office. They may be removed at night for sleeping. ° °MEDICATIONS:  See your medication summary on the “After Visit Summary” that nursing will review with you.  You may have some home medications which will be placed on hold until you complete the course of blood thinner medication.  It is important for you to complete the blood thinner medication as prescribed. ° °PRECAUTIONS:  If you experience chest pain or shortness of breath - call 911 immediately for transfer to the hospital emergency department.  ° °If you develop a fever greater that 101 F, purulent drainage from wound, increased redness or drainage from wound, foul odor from the wound/dressing, or calf pain - CONTACT YOUR SURGEON.   °                                                °FOLLOW-UP APPOINTMENTS:  If you do not already have a post-op appointment, please call the office for an appointment to be seen by your surgeon.  Guidelines for how soon to be seen are listed in your “After Visit Summary”, but are typically between 1-4 weeks after surgery. ° °OTHER INSTRUCTIONS:  ° °POST-OPERATIVE OPIOID TAPER INSTRUCTIONS: °It is important to wean off of your opioid medication as soon as  possible. If you do not need pain medication after your surgery it is ok to stop day one. °Opioids include: °Codeine, Hydrocodone(Norco, Vicodin), Oxycodone(Percocet, oxycontin) and hydromorphone amongst others.  °Long term and even short term use of opiods can cause: °Increased pain response °Dependence °Constipation °Depression °Respiratory depression °And more.  °Withdrawal symptoms can include °Flu like symptoms °Nausea, vomiting °And more °Techniques to manage these symptoms °Hydrate well °Eat regular healthy meals °Stay active °Use relaxation techniques(deep breathing, meditating, yoga) °Do Not substitute Alcohol to help with tapering °If you have been on opioids for less than two weeks and do not have pain than it is ok to stop all together.  °Plan to wean off of opioids °This plan should start within one week post op of your joint replacement. °Maintain the same interval or time between taking each dose and first decrease the dose.  °Cut the total daily intake of opioids by one tablet each day °Next start to increase the time between doses. °The last dose that should be eliminated is the evening dose.  ° °MAKE SURE YOU:  °Understand these instructions.  °Get help right away if you are not doing well or get worse.  ° ° °Thank you for letting us be a part of your medical care team.  It is a privilege we respect greatly.  We hope these instructions will help you stay on track for a fast and full recovery!  ° ° °  °

## 2022-11-08 NOTE — Interval H&P Note (Signed)
History and Physical Interval Note:  11/08/2022 8:53 AM  Joanna Williams  has presented today for surgery, with the diagnosis of DJD left hip.  The various methods of treatment have been discussed with the patient and family. After consideration of risks, benefits and other options for treatment, the patient has consented to  Procedure(s): TOTAL HIP ARTHROPLASTY (Left) as a surgical intervention.  The patient's history has been reviewed, patient examined, no change in status, stable for surgery.  I have reviewed the patient's chart and labs.  Questions were answered to the patient's satisfaction.     Eulas Post

## 2022-11-08 NOTE — Anesthesia Procedure Notes (Signed)
Spinal  Patient location during procedure: OR Start time: 11/08/2022 12:34 PM End time: 11/08/2022 12:39 PM Reason for block: surgical anesthesia Staffing Performed: resident/CRNA  Anesthesiologist: Gaynelle Adu, MD Resident/CRNA: Marny Lowenstein, CRNA Performed by: Marny Lowenstein, CRNA Authorized by: Gaynelle Adu, MD   Preanesthetic Checklist Completed: patient identified, IV checked, site marked, risks and benefits discussed, surgical consent, monitors and equipment checked, pre-op evaluation and timeout performed Spinal Block Patient position: sitting Prep: DuraPrep Patient monitoring: heart rate, continuous pulse ox and blood pressure Approach: midline Location: L3-4 Injection technique: single-shot Needle Needle type: Pencan  Needle gauge: 25 G Assessment Sensory level: T6 Events: CSF return

## 2022-11-08 NOTE — Anesthesia Preprocedure Evaluation (Addendum)
Anesthesia Evaluation  Patient identified by MRN, date of birth, ID band Patient awake    Reviewed: Allergy & Precautions, H&P , NPO status , Patient's Chart, lab work & pertinent test results  Airway Mallampati: II  TM Distance: >3 FB Neck ROM: Full    Dental no notable dental hx. (+) Teeth Intact, Dental Advisory Given   Pulmonary neg pulmonary ROS   Pulmonary exam normal breath sounds clear to auscultation       Cardiovascular negative cardio ROS  Rhythm:Regular Rate:Normal     Neuro/Psych negative neurological ROS  negative psych ROS   GI/Hepatic Neg liver ROS,GERD  Medicated,,  Endo/Other  Hypothyroidism    Renal/GU negative Renal ROS  negative genitourinary   Musculoskeletal   Abdominal   Peds  Hematology negative hematology ROS (+)   Anesthesia Other Findings   Reproductive/Obstetrics negative OB ROS                             Anesthesia Physical Anesthesia Plan  ASA: 2  Anesthesia Plan: Spinal   Post-op Pain Management: Tylenol PO (pre-op)*   Induction: Intravenous  PONV Risk Score and Plan: 3 and Ondansetron, Dexamethasone and Propofol infusion  Airway Management Planned: Natural Airway and Simple Face Mask  Additional Equipment:   Intra-op Plan:   Post-operative Plan:   Informed Consent: I have reviewed the patients History and Physical, chart, labs and discussed the procedure including the risks, benefits and alternatives for the proposed anesthesia with the patient or authorized representative who has indicated his/her understanding and acceptance.     Dental advisory given  Plan Discussed with: CRNA  Anesthesia Plan Comments:        Anesthesia Quick Evaluation

## 2022-11-08 NOTE — Op Note (Signed)
11/08/2022  2:14 PM  PATIENT:  Joanna Williams   MRN: [id number]  PRE-OPERATIVE DIAGNOSIS:  left hip primary localized osteoarthritis  POST-OPERATIVE DIAGNOSIS:  same  PROCEDURE:  Procedure(s): TOTAL HIP ARTHROPLASTY  PREOPERATIVE INDICATIONS:    Joanna Williams is an 82 y.o. female who has a diagnosis of left hip primary localized osteoarthritis3 and elected for surgical management after failing conservative treatment.  The risks benefits and alternatives were discussed with the patient including but not limited to the risks of nonoperative treatment, versus surgical intervention including infection, bleeding, nerve injury, periprosthetic fracture, the need for revision surgery, dislocation, leg length discrepancy, blood clots, cardiopulmonary complications, morbidity, mortality, among others, and they were willing to proceed.     OPERATIVE REPORT     SURGEON:  Teryl Lucy, MD    ASSISTANT:  Janine Ores, PA-C, (Present throughout the entire procedure,  necessary for completion of procedure in a timely manner, assisting with retraction, instrumentation, and closure)     ANESTHESIA: Spinal  ESTIMATED BLOOD LOSS: 300 mL    COMPLICATIONS:  None.     UNIQUE ASPECTS OF THE CASE: Left hip felt fairly short preoperatively.  The femoral neck cut was left fairly long, it felt like a full thumbs breath, but I did have adequate access to the acetabulum, although barely.  My cup was slightly anteverted, and slightly flat, and I felt like I had good stability.  I had beads posteriorly and superiorly, and I could see just a little bit of anterior wall.  Was not sure if it was primarily soft tissue, or where the bony ridge wise.  She did have osteophytes circumferentially on the acetabulum making assessment of native version a little bit challenging.  The femoral canal felt a little bit more open than her proximal geometry, I did reamed to a size 5, and then broached to a 5, the 5 was not too  difficult going down on the reamer, but proximally felt tight and I did not feel like I could fit a 6.    COMPONENTS:  [employer] Duofix Press fit femur size 5 standard offset with a 32 mm + 5 metallic head ball and a Gription Acetabular shell size 50, with a single cancellous screw for backup fixation, with an apex hole eliminator and a +4 neutral polyethylene liner.    PROCEDURE IN DETAIL:   The patient was met in the holding area and  identified.  The appropriate hip was identified and marked at the operative site.  The patient was then transported to the OR  and  placed under anesthesia.  At that point, the patient was  placed in the lateral decubitus position with the operative side up and  secured to the operating room table and all bony prominences padded.     The operative lower extremity was prepped from the iliac crest to the distal leg.  Sterile draping was performed.  Time out was performed prior to incision.      A routine posterolateral approach was utilized via sharp dissection  carried down to the subcutaneous tissue.  Gross bleeders were Bovie coagulated.  The iliotibial band was identified and incised along the length of the skin incision.  Self-retaining retractors were  inserted.  With the hip internally rotated, the short external rotators  were identified. The piriformis and capsule was tagged with FiberWire, and the hip capsule released in a T-type fashion.  The femoral neck was exposed, and I resected the femoral neck using the  appropriate jig. This was performed at approximately a thumb's breadth above the lesser trochanter.    I then exposed the deep acetabulum, cleared out any tissue including the ligamentum teres.  A wing retractor was placed.  After adequate visualization, I excised the labrum, and then sequentially reamed.  I placed the trial acetabulum, which seated nicely, and then impacted the real cup into place.  Appropriate version and inclination was confirmed  clinically matching their bony anatomy, and also with the use of the jig.  I placed a cancellous screw to augment fixation.  A trial polyethylene liner was placed and the wing retractor removed.    I then prepared the proximal femur using the cookie-cutter, the lateralizing reamer, and then sequentially reamed and broached.  A trial broach, neck, and head was utilized, at this point it appeared that the Ethibond had been incarcerated in the inferior most aspect of the cup placement, so I had to extricate the Ethibond, and I reduced the hip and it was found to have excellent stability with functional range of motion. The trial components were then removed, and the real polyethylene liner was placed.  I trialed with a +1 that did not quite have the length restoration and soft tissue tension that I wanted, and the +5 felt better.  I then impacted the real femoral prosthesis into place into the appropriate version, slightly anteverted to the normal anatomy, and I impacted the real head ball into place. The hip was then reduced and taken through functional range of motion and found to have excellent stability. Leg lengths were restored.  I then used a 2 mm drill bits to pass the FiberWire suture from the capsule and piriformis through the greater trochanter, and secured this. Excellent posterior capsular repair was achieved. I also closed the T in the capsule.  I then irrigated the hip copiously again with pulse lavage, and repaired the fascia with Vicryl, followed by Vicryl for the subcutaneous tissue, Monocryl for the skin, Steri-Strips and sterile gauze. The wounds were injected. The patient was then awakened and returned to PACU in stable and satisfactory condition. There were no complications.  Teryl Lucy, MD Orthopedic Surgeon [phone]   11/08/2022 2:14 PM

## 2022-11-08 NOTE — Evaluation (Signed)
Physical Therapy Evaluation Patient Details Name: Joanna Williams MRN: [id number] DOB: [birth year] Today's Date: 11/08/2022  History of Present Illness  82 yo female presents to therapy s/p L THA on 11/08/2022 due to failure of conservative measures. Posterior lateral approach and no formal posteiror hip precautions. Pt PMH includes but is not limited to: HTN, macular degeneration and R THA (2013).  Clinical Impression    Joanna Williams is a 82 y.o. female POD 0 s/p L THA. Patient reports IND with mobility at baseline. Patient is now limited by functional impairments (see PT problem list below) and requires min guard and cues for transfers and gait with RW. Patient was able to ambulate 45 feet x 2 with RW and min guard and cues for safe walker management. Patient educated on safe sequencing for stair mobility, pain management, car transfers and fall risk prevention pt and spouse verbalized understanding of safe guarding position for people assisting with mobility. Patient instructed in exercises to facilitate ROM and circulation reviewed and HO provided. Patient will benefit from continued skilled PT interventions to address impairments and progress towards PLOF. Patient has met mobility goals at adequate level for discharge home with family support and OPPT; will continue to follow if pt continues acute stay to progress towards Mod I goals.      Recommendations for follow up therapy are one component of a multi-disciplinary discharge planning process, led by the attending physician.  Recommendations may be updated based on patient status, additional functional criteria and insurance authorization.  Follow Up Recommendations       Assistance Recommended at Discharge Intermittent Supervision/Assistance  Patient can return home with the following  A little help with walking and/or transfers;A little help with bathing/dressing/bathroom;Assistance with cooking/housework;Assist for transportation;Help  with stairs or ramp for entrance    Equipment Recommendations None recommended by PT (pt report DME in home setting)  Recommendations for Other Services       Functional Status Assessment Patient has had a recent decline in their functional status and demonstrates the ability to make significant improvements in function in a reasonable and predictable amount of time.     Precautions / Restrictions Precautions Precautions: Fall Restrictions Weight Bearing Restrictions: No LLE Weight Bearing: Weight bearing as tolerated      Mobility  Bed Mobility Overal bed mobility: Needs Assistance Bed Mobility: Supine to Sit     Supine to sit: Supervision     General bed mobility comments: min cues    Transfers Overall transfer level: Needs assistance Equipment used: Rolling walker (2 wheels) Transfers: Sit to/from Stand Sit to Stand: Min guard           General transfer comment: cues for proper UE placement    Ambulation/Gait Ambulation/Gait assistance: Min guard Gait Distance (Feet): 45 Feet Assistive device: Rolling walker (2 wheels) Gait Pattern/deviations: Step-to pattern, Antalgic Gait velocity: decreased     General Gait Details: pt reports leg length discrepancy feeling like L LE > R LE  Stairs Stairs: Yes Stairs assistance: Min guard Stair Management: Two rails Number of Stairs: 5 General stair comments: min cues for safety and sequencing  Wheelchair Mobility    Modified Rankin (Stroke Patients Only)       Balance Overall balance assessment: Needs assistance Sitting-balance support: Single extremity supported Sitting balance-Leahy Scale: Good     Standing balance support: Reliant on assistive device for balance, During functional activity, Bilateral upper extremity supported (static standing no UE support) Standing balance-Leahy Scale: Fair  Pertinent Vitals/Pain Pain Assessment Pain Assessment:  0-10 Pain Score: 3  Pain Location: L hip Pain Descriptors / Indicators: Sore, Operative site guarding, Discomfort Pain Intervention(s): Limited activity within patient's tolerance, Monitored during session, Premedicated before session, Repositioned, Ice applied    Home Living Family/patient expects to be discharged to:: Private residence Living Arrangements: Spouse/significant other Available Help at Discharge: Family Type of Home: House Home Access: Stairs to enter Entrance Stairs-Rails: Right;Left;Can reach both Secretary/administrator of Steps: 2 Alternate Level Stairs-Number of Steps: 14 Home Layout: Two level Home Equipment: Teacher, English as a foreign language (2 wheels)      Prior Function Prior Level of Function : Independent/Modified Independent             Mobility Comments: IND wtih all ADLs, self care tasks, IADLs, driving ADLs Comments: playing pickleball     Hand Dominance        Extremity/Trunk Assessment        Lower Extremity Assessment Lower Extremity Assessment: LLE deficits/detail LLE Deficits / Details: ankle DF/PF 5/5 LLE Sensation: WNL    Cervical / Trunk Assessment Cervical / Trunk Assessment:  (wfl)  Communication   Communication: No difficulties  Cognition Arousal/Alertness: Awake/alert Behavior During Therapy: WFL for tasks assessed/performed Overall Cognitive Status: Within Functional Limits for tasks assessed                                          General Comments      Exercises Total Joint Exercises Ankle Circles/Pumps: AROM, Both, 20 reps Quad Sets: AROM, Left, 5 reps Heel Slides: AROM, Left, 5 reps Hip ABduction/ADduction: AROM, Left, 5 reps Long Arc Quad: AROM, Left, 5 reps Knee Flexion: AROM, Left, 5 reps, Standing Marching in Standing: AROM, Left, 5 reps Standing Hip Extension: AROM, Left, 5 reps   Assessment/Plan    PT Assessment Patient needs continued PT services  PT Problem List Decreased  strength;Decreased activity tolerance;Decreased balance;Decreased mobility;Decreased coordination;Pain       PT Treatment Interventions DME instruction;Gait training;Stair training;Functional mobility training;Therapeutic activities;Therapeutic exercise;Balance training;Neuromuscular re-education;Patient/family education;Modalities    PT Goals (Current goals can be found in the Care Plan section)  Acute Rehab PT Goals Patient Stated Goal: tp get back to pickle ball PT Goal Formulation: With patient Time For Goal Achievement: 11/22/22 Potential to Achieve Goals: Good    Frequency       Co-evaluation               AM-PAC PT "6 Clicks" Mobility  Outcome Measure Help needed turning from your back to your side while in a flat bed without using bedrails?: None Help needed moving from lying on your back to sitting on the side of a flat bed without using bedrails?: A Little Help needed moving to and from a bed to a chair (including a wheelchair)?: A Little Help needed standing up from a chair using your arms (e.g., wheelchair or bedside chair)?: A Little Help needed to walk in hospital room?: A Little Help needed climbing 3-5 steps with a railing? : A Little 6 Click Score: 19    End of Session Equipment Utilized During Treatment: Gait belt Activity Tolerance: Patient tolerated treatment well Patient left: in chair;with call bell/phone within reach;with family/visitor present Nurse Communication: Mobility status PT Visit Diagnosis: Unsteadiness on feet (R26.81);Other abnormalities of gait and mobility (R26.89);Muscle weakness (generalized) (M62.81);Pain Pain - Right/Left: Left Pain - part of body: Hip  Time: 1610-9604 PT Time Calculation (min) (ACUTE ONLY): 41 min   Charges:   PT Evaluation $PT Eval Low Complexity: 1 Low PT Treatments $Gait Training: 8-22 mins $Therapeutic Exercise: 8-22 mins         Rica Mote, PT   Jacqualyn Posey 11/08/2022, 6:03 PM

## 2022-11-08 NOTE — Transfer of Care (Signed)
Immediate Anesthesia Transfer of Care Note  Patient: Joanna Williams  Procedure(s) Performed: TOTAL HIP ARTHROPLASTY (Left: Hip)  Patient Location: PACU  Anesthesia Type:Spinal  Level of Consciousness: awake and patient cooperative  Airway & Oxygen Therapy: Patient Spontanous Breathing and Patient connected to face mask  Post-op Assessment: Report given to RN and Post -op Vital signs reviewed and stable  Post vital signs: Reviewed and stable  Last Vitals:  Vitals Value Taken Time  BP 103/63 11/08/22 1432  Temp    Pulse 59 11/08/22 1434  Resp 14 11/08/22 1434  SpO2 96 % 11/08/22 1434  Vitals shown include unvalidated device data.  Last Pain:  Vitals:   11/08/22 0905  TempSrc:   PainSc: 8       Patients Stated Pain Goal: 5 (11/08/22 0905)  Complications: No notable events documented.

## 2022-11-09 ENCOUNTER — Encounter (HOSPITAL_COMMUNITY): Payer: Self-pay | Admitting: Orthopedic Surgery

## 2022-11-09 DIAGNOSIS — Z96641 Presence of right artificial hip joint: Secondary | ICD-10-CM | POA: Diagnosis not present

## 2022-11-09 DIAGNOSIS — K219 Gastro-esophageal reflux disease without esophagitis: Secondary | ICD-10-CM | POA: Diagnosis not present

## 2022-11-09 DIAGNOSIS — H3530 Unspecified macular degeneration: Secondary | ICD-10-CM | POA: Diagnosis not present

## 2022-11-09 DIAGNOSIS — E785 Hyperlipidemia, unspecified: Secondary | ICD-10-CM | POA: Diagnosis not present

## 2022-11-09 DIAGNOSIS — Z471 Aftercare following joint replacement surgery: Secondary | ICD-10-CM | POA: Diagnosis not present

## 2022-11-09 DIAGNOSIS — Z7982 Long term (current) use of aspirin: Secondary | ICD-10-CM | POA: Diagnosis not present

## 2022-11-09 DIAGNOSIS — K5900 Constipation, unspecified: Secondary | ICD-10-CM | POA: Diagnosis not present

## 2022-11-09 DIAGNOSIS — Z96642 Presence of left artificial hip joint: Secondary | ICD-10-CM | POA: Diagnosis not present

## 2022-11-09 NOTE — Anesthesia Postprocedure Evaluation (Signed)
Anesthesia Post Note  Patient: Joanna Williams  Procedure(s) Performed: TOTAL HIP ARTHROPLASTY (Left: Hip)     Patient location during evaluation: Other Anesthesia Type: Spinal Level of consciousness: oriented and awake and alert Pain management: pain level controlled Vital Signs Assessment: post-procedure vital signs reviewed and stable Respiratory status: spontaneous breathing and respiratory function stable Cardiovascular status: blood pressure returned to baseline and stable Postop Assessment: no headache, no backache, no apparent nausea or vomiting, spinal receding and patient able to bend at knees Anesthetic complications: no  No notable events documented.  Last Vitals:  Vitals:   11/08/22 1700 11/08/22 1715  BP: 100/81 113/87  Pulse: 62 65  Resp:    Temp:    SpO2: 100% 98%    Last Pain:  Vitals:   11/08/22 1758  TempSrc:   PainSc: 1                  Loni Delbridge,W. EDMOND

## 2022-11-12 DIAGNOSIS — Z7982 Long term (current) use of aspirin: Secondary | ICD-10-CM | POA: Diagnosis not present

## 2022-11-12 DIAGNOSIS — Z471 Aftercare following joint replacement surgery: Secondary | ICD-10-CM | POA: Diagnosis not present

## 2022-11-12 DIAGNOSIS — H3530 Unspecified macular degeneration: Secondary | ICD-10-CM | POA: Diagnosis not present

## 2022-11-12 DIAGNOSIS — E785 Hyperlipidemia, unspecified: Secondary | ICD-10-CM | POA: Diagnosis not present

## 2022-11-12 DIAGNOSIS — K5900 Constipation, unspecified: Secondary | ICD-10-CM | POA: Diagnosis not present

## 2022-11-12 DIAGNOSIS — Z96641 Presence of right artificial hip joint: Secondary | ICD-10-CM | POA: Diagnosis not present

## 2022-11-12 DIAGNOSIS — K219 Gastro-esophageal reflux disease without esophagitis: Secondary | ICD-10-CM | POA: Diagnosis not present

## 2022-11-12 DIAGNOSIS — Z96642 Presence of left artificial hip joint: Secondary | ICD-10-CM | POA: Diagnosis not present

## 2022-11-14 DIAGNOSIS — Z96642 Presence of left artificial hip joint: Secondary | ICD-10-CM | POA: Diagnosis not present

## 2022-11-14 DIAGNOSIS — Z96641 Presence of right artificial hip joint: Secondary | ICD-10-CM | POA: Diagnosis not present

## 2022-11-14 DIAGNOSIS — Z471 Aftercare following joint replacement surgery: Secondary | ICD-10-CM | POA: Diagnosis not present

## 2022-11-14 DIAGNOSIS — H3530 Unspecified macular degeneration: Secondary | ICD-10-CM | POA: Diagnosis not present

## 2022-11-14 DIAGNOSIS — K219 Gastro-esophageal reflux disease without esophagitis: Secondary | ICD-10-CM | POA: Diagnosis not present

## 2022-11-14 DIAGNOSIS — Z7982 Long term (current) use of aspirin: Secondary | ICD-10-CM | POA: Diagnosis not present

## 2022-11-14 DIAGNOSIS — E785 Hyperlipidemia, unspecified: Secondary | ICD-10-CM | POA: Diagnosis not present

## 2022-11-14 DIAGNOSIS — K5900 Constipation, unspecified: Secondary | ICD-10-CM | POA: Diagnosis not present

## 2022-11-15 DIAGNOSIS — H353211 Exudative age-related macular degeneration, right eye, with active choroidal neovascularization: Secondary | ICD-10-CM | POA: Diagnosis not present

## 2022-11-15 DIAGNOSIS — H26493 Other secondary cataract, bilateral: Secondary | ICD-10-CM | POA: Diagnosis not present

## 2022-11-15 DIAGNOSIS — H43813 Vitreous degeneration, bilateral: Secondary | ICD-10-CM | POA: Diagnosis not present

## 2022-11-15 DIAGNOSIS — H353223 Exudative age-related macular degeneration, left eye, with inactive scar: Secondary | ICD-10-CM | POA: Diagnosis not present

## 2022-11-16 DIAGNOSIS — Z471 Aftercare following joint replacement surgery: Secondary | ICD-10-CM | POA: Diagnosis not present

## 2022-11-16 DIAGNOSIS — H3530 Unspecified macular degeneration: Secondary | ICD-10-CM | POA: Diagnosis not present

## 2022-11-16 DIAGNOSIS — K5900 Constipation, unspecified: Secondary | ICD-10-CM | POA: Diagnosis not present

## 2022-11-16 DIAGNOSIS — Z7982 Long term (current) use of aspirin: Secondary | ICD-10-CM | POA: Diagnosis not present

## 2022-11-16 DIAGNOSIS — K219 Gastro-esophageal reflux disease without esophagitis: Secondary | ICD-10-CM | POA: Diagnosis not present

## 2022-11-16 DIAGNOSIS — Z96641 Presence of right artificial hip joint: Secondary | ICD-10-CM | POA: Diagnosis not present

## 2022-11-16 DIAGNOSIS — E785 Hyperlipidemia, unspecified: Secondary | ICD-10-CM | POA: Diagnosis not present

## 2022-11-16 DIAGNOSIS — Z96642 Presence of left artificial hip joint: Secondary | ICD-10-CM | POA: Diagnosis not present

## 2022-11-17 DIAGNOSIS — Z96641 Presence of right artificial hip joint: Secondary | ICD-10-CM | POA: Diagnosis not present

## 2022-11-17 DIAGNOSIS — E785 Hyperlipidemia, unspecified: Secondary | ICD-10-CM | POA: Diagnosis not present

## 2022-11-17 DIAGNOSIS — Z7982 Long term (current) use of aspirin: Secondary | ICD-10-CM | POA: Diagnosis not present

## 2022-11-17 DIAGNOSIS — K5900 Constipation, unspecified: Secondary | ICD-10-CM | POA: Diagnosis not present

## 2022-11-17 DIAGNOSIS — Z96642 Presence of left artificial hip joint: Secondary | ICD-10-CM | POA: Diagnosis not present

## 2022-11-17 DIAGNOSIS — H3530 Unspecified macular degeneration: Secondary | ICD-10-CM | POA: Diagnosis not present

## 2022-11-17 DIAGNOSIS — Z471 Aftercare following joint replacement surgery: Secondary | ICD-10-CM | POA: Diagnosis not present

## 2022-11-17 DIAGNOSIS — K219 Gastro-esophageal reflux disease without esophagitis: Secondary | ICD-10-CM | POA: Diagnosis not present

## 2022-11-21 DIAGNOSIS — Z7982 Long term (current) use of aspirin: Secondary | ICD-10-CM | POA: Diagnosis not present

## 2022-11-21 DIAGNOSIS — E785 Hyperlipidemia, unspecified: Secondary | ICD-10-CM | POA: Diagnosis not present

## 2022-11-21 DIAGNOSIS — Z96641 Presence of right artificial hip joint: Secondary | ICD-10-CM | POA: Diagnosis not present

## 2022-11-21 DIAGNOSIS — H3530 Unspecified macular degeneration: Secondary | ICD-10-CM | POA: Diagnosis not present

## 2022-11-21 DIAGNOSIS — K219 Gastro-esophageal reflux disease without esophagitis: Secondary | ICD-10-CM | POA: Diagnosis not present

## 2022-11-21 DIAGNOSIS — Z96642 Presence of left artificial hip joint: Secondary | ICD-10-CM | POA: Diagnosis not present

## 2022-11-21 DIAGNOSIS — Z471 Aftercare following joint replacement surgery: Secondary | ICD-10-CM | POA: Diagnosis not present

## 2022-11-21 DIAGNOSIS — K5900 Constipation, unspecified: Secondary | ICD-10-CM | POA: Diagnosis not present

## 2022-11-21 DIAGNOSIS — M1612 Unilateral primary osteoarthritis, left hip: Secondary | ICD-10-CM | POA: Diagnosis not present

## 2022-11-22 ENCOUNTER — Ambulatory Visit: Payer: Medicare HMO

## 2022-12-06 ENCOUNTER — Ambulatory Visit (INDEPENDENT_AMBULATORY_CARE_PROVIDER_SITE_OTHER): Payer: Medicare HMO | Admitting: Family Medicine

## 2022-12-06 ENCOUNTER — Encounter: Payer: Self-pay | Admitting: Family Medicine

## 2022-12-06 ENCOUNTER — Other Ambulatory Visit (HOSPITAL_BASED_OUTPATIENT_CLINIC_OR_DEPARTMENT_OTHER): Payer: Self-pay | Admitting: Family Medicine

## 2022-12-06 VITALS — BP 126/72 | HR 76 | Temp 97.7°F | Resp 18 | Ht 64.0 in | Wt 134.8 lb

## 2022-12-06 DIAGNOSIS — E049 Nontoxic goiter, unspecified: Secondary | ICD-10-CM

## 2022-12-06 DIAGNOSIS — E039 Hypothyroidism, unspecified: Secondary | ICD-10-CM

## 2022-12-06 DIAGNOSIS — E785 Hyperlipidemia, unspecified: Secondary | ICD-10-CM | POA: Diagnosis not present

## 2022-12-06 DIAGNOSIS — Z0000 Encounter for general adult medical examination without abnormal findings: Secondary | ICD-10-CM | POA: Diagnosis not present

## 2022-12-06 DIAGNOSIS — M810 Age-related osteoporosis without current pathological fracture: Secondary | ICD-10-CM | POA: Diagnosis not present

## 2022-12-06 DIAGNOSIS — M25552 Pain in left hip: Secondary | ICD-10-CM

## 2022-12-06 DIAGNOSIS — Z1231 Encounter for screening mammogram for malignant neoplasm of breast: Secondary | ICD-10-CM

## 2022-12-06 LAB — COMPREHENSIVE METABOLIC PANEL
ALT: 10 U/L (ref 0–35)
AST: 19 U/L (ref 0–37)
Albumin: 4 g/dL (ref 3.5–5.2)
Alkaline Phosphatase: 102 U/L (ref 39–117)
BUN: 18 mg/dL (ref 6–23)
CO2: 30 mEq/L (ref 19–32)
Calcium: 9.2 mg/dL (ref 8.4–10.5)
Chloride: 100 mEq/L (ref 96–112)
Creatinine, Ser: 0.67 mg/dL (ref 0.40–1.20)
GFR: 81.09 mL/min (ref >60.00)
Glucose, Bld: 88 mg/dL (ref 70–99)
Potassium: 4.6 mEq/L (ref 3.5–5.1)
Sodium: 138 mEq/L (ref 135–145)
Total Bilirubin: 0.5 mg/dL (ref 0.2–1.2)
Total Protein: 6.2 g/dL (ref 6.0–8.3)

## 2022-12-06 LAB — CBC WITH DIFFERENTIAL/PLATELET
Basophils Absolute: 0 10*3/uL (ref 0.0–0.1)
Basophils Relative: 0.4 % (ref 0.0–3.0)
Eosinophils Absolute: 0.3 10*3/uL (ref 0.0–0.7)
Eosinophils Relative: 4.6 % (ref 0.0–5.0)
HCT: 39.8 % (ref 36.0–46.0)
Hemoglobin: 12.9 g/dL (ref 12.0–15.0)
Lymphocytes Relative: 27.1 % (ref 12.0–46.0)
Lymphs Abs: 2 10*3/uL (ref 0.7–4.0)
MCHC: 32.5 g/dL (ref 30.0–36.0)
MCV: 98.8 fl (ref 78.0–100.0)
Monocytes Absolute: 0.6 10*3/uL (ref 0.1–1.0)
Monocytes Relative: 8.2 % (ref 3.0–12.0)
Neutro Abs: 4.4 10*3/uL (ref 1.4–7.7)
Neutrophils Relative %: 59.7 % (ref 43.0–77.0)
Platelets: 269 10*3/uL (ref 150.0–400.0)
RBC: 4.02 Mil/uL (ref 3.87–5.11)
RDW: 13.1 % (ref 11.5–15.5)
WBC: 7.4 10*3/uL (ref 4.0–10.5)

## 2022-12-06 LAB — LIPID PANEL
Cholesterol: 160 mg/dL (ref 0–200)
HDL: 52.3 mg/dL (ref >39.00)
LDL Cholesterol: 76 mg/dL (ref 0–99)
NonHDL: 107.65
Total CHOL/HDL Ratio: 3
Triglycerides: 158 mg/dL — ABNORMAL HIGH (ref 0.0–149.0)
VLDL: 31.6 mg/dL (ref 0.0–40.0)

## 2022-12-06 LAB — TSH: TSH: 3.45 u[IU]/mL (ref 0.35–5.50)

## 2022-12-06 MED ORDER — ALENDRONATE SODIUM 70 MG PO TABS: 70.0000 mg | ORAL_TABLET | ORAL | 3 refills | Status: DC

## 2022-12-06 NOTE — Assessment & Plan Note (Signed)
S/p hip replacement  Wounds healing well  Doing exercise at home

## 2022-12-06 NOTE — Progress Notes (Signed)
Established Patient Office Visit  Subjective   Patient ID: Joanna Williams, female    DOB: Jan 21, 1941  Age: 82 y.o. MRN: [id number]  Chief Complaint  Patient presents with  . Annual Exam    Concerns/ questions: none AWV due Mammogram due- last done on 09/21/21    HPI Pt is here for cpe and f/u chol, thyroid.  She is 3 weeks s/p hip replacement and doing well. No complaints.  Patient Active Problem List   Diagnosis Date Noted  . Left hip pain 09/26/2022  . Insomnia 09/26/2022  . Pre-op examination 09/26/2022  . Preventative health care 07/18/2021  . Age-related osteoporosis without current pathological fracture 06/04/2019  . Murmur 06/04/2019  . Tongue lesion 06/04/2019  . Low back pain with radiation 06/04/2019  . Right lower quadrant abdominal mass 01/24/2019  . Carpal tunnel syndrome on both sides 11/06/2017  . UTI (urinary tract infection) 05/12/2014  . Sciatica 02/04/2014  . GERD (gastroesophageal reflux disease) 08/13/2012  . Hypothyroidism 06/15/2010  . MOLE 11/27/2009  . Hyperlipidemia 02/13/2007   Past Medical History:  Diagnosis Date  . GERD (gastroesophageal reflux disease)   . Hyperlipidemia   . Macular degeneration 05/04/2017   Past Surgical History:  Procedure Laterality Date  . COLONOSCOPY  2 yrs ago  . EYE SURGERY Bilateral 2014   cataract  . TOTAL HIP ARTHROPLASTY  08/10/2011   Procedure: TOTAL HIP ARTHROPLASTY;  Surgeon: Nestor Lewandowsky, MD;  Location: [facility] OR;  Service: Orthopedics;  Laterality: Right;  DEPUY/ PENNACLE POLY OR CERAMIC  . TOTAL HIP ARTHROPLASTY Left 11/08/2022   Procedure: TOTAL HIP ARTHROPLASTY;  Surgeon: Teryl Lucy, MD;  Location: [facility];  Service: Orthopedics;  Laterality: Left;   Social History   Tobacco Use  . Smoking status: Never  . Smokeless tobacco: Never  Vaping Use  . Vaping Use: Never used  Substance Use Topics  . Alcohol use: Yes    Alcohol/week: 1.0 standard drink of alcohol    Types: 1 Glasses of wine per  week    Comment: rare  . Drug use: Yes    Types: Hydrocodone   Social History   Socioeconomic History  . Marital status: Married    Spouse name: Not on file  . Number of children: Not on file  . Years of education: Not on file  . Highest education level: Not on file  Occupational History  . Occupation: retired    Associate Professor: RETIRED  Tobacco Use  . Smoking status: Never  . Smokeless tobacco: Never  Vaping Use  . Vaping Use: Never used  Substance and Sexual Activity  . Alcohol use: Yes    Alcohol/week: 1.0 standard drink of alcohol    Types: 1 Glasses of wine per week    Comment: rare  . Drug use: Yes    Types: Hydrocodone  . Sexual activity: Yes    Partners: Male  Other Topics Concern  . Not on file  Social History Narrative   Walking, yoga, pickle ball   Social Determinants of Health   Financial Resource Strain: Low Risk  (09/13/2021)   Overall Financial Resource Strain (CARDIA)   . Difficulty of Paying Living Expenses: Not hard at all  Food Insecurity: No Food Insecurity (09/13/2021)   Hunger Vital Sign   . Worried About Programme researcher, broadcasting/film/video in the Last Year: Never true   . Ran Out of Food in the Last Year: Never true  Transportation Needs: No Transportation Needs (09/13/2021)   PRAPARE -  Transportation   . Lack of Transportation (Medical): No   . Lack of Transportation (Non-Medical): No  Physical Activity: Sufficiently Active (09/13/2021)   Exercise Vital Sign   . Days of Exercise per Week: 7 days   . Minutes of Exercise per Session: 60 min  Stress: No Stress Concern Present (09/13/2021)   [employer] of Occupational Health - Occupational Stress Questionnaire   . Feeling of Stress : Not at all  Social Connections: Moderately Integrated (09/13/2021)   Social Connection and Isolation Panel [NHANES]   . Frequency of Communication with Friends and Family: More than three times a week   . Frequency of Social Gatherings with Friends and Family: More than three  times a week   . Attends Religious Services: More than 4 times per year   . Active Member of Clubs or Organizations: No   . Attends Banker Meetings: Never   . Marital Status: Married  Catering manager Violence: Not At Risk (09/13/2021)   Humiliation, Afraid, Rape, and Kick questionnaire   . Fear of Current or Ex-Partner: No   . Emotionally Abused: No   . Physically Abused: No   . Sexually Abused: No   Family Status  Relation Name Status  . Mother  Deceased at age 62  . Father  Deceased  . Sister  Alive  . Brother  Alive  . Sister  Alive  . Sister  Alive  . Sister  Alive   Family History  Problem Relation Age of Onset  . Heart disease Mother   . Cancer Father 34       brain   Allergies  Allergen Reactions  . Niacin Nausea Only      Review of Systems  Constitutional:  Negative for fever and malaise/fatigue.  HENT:  Negative for congestion.   Eyes:  Negative for blurred vision.  Respiratory:  Negative for shortness of breath.   Cardiovascular:  Negative for chest pain, palpitations and leg swelling.  Gastrointestinal:  Negative for abdominal pain, blood in stool and nausea.  Genitourinary:  Negative for dysuria and frequency.  Musculoskeletal:  Positive for joint pain. Negative for falls.  Skin:  Negative for rash.  Neurological:  Negative for dizziness, loss of consciousness and headaches.  Endo/Heme/Allergies:  Negative for environmental allergies.  Psychiatric/Behavioral:  Negative for depression. The patient is not nervous/anxious.       Objective:     BP 126/72 (BP Location: Left Arm, Patient Position: Sitting, Cuff Size: Normal)   Pulse 76   Temp 97.7 F (36.5 C) (Oral)   Resp 18   Ht 5\' 4"  (1.626 m)   Wt 134 lb 12.8 oz (61.1 kg)   SpO2 96%   BMI 23.14 kg/m  BP Readings from Last 3 Encounters:  12/06/22 126/72  11/08/22 113/87  10/27/22 (!) 149/73   Wt Readings from Last 3 Encounters:  12/06/22 134 lb 12.8 oz (61.1 kg)  11/08/22  139 lb (63 kg)  10/27/22 139 lb (63 kg)   SpO2 Readings from Last 3 Encounters:  12/06/22 96%  11/08/22 98%  10/27/22 96%      Physical Exam Vitals and nursing note reviewed.  Constitutional:      Appearance: She is well-developed.  HENT:     Head: Normocephalic and atraumatic.     Right Ear: Tympanic membrane normal.     Left Ear: Tympanic membrane normal.     Nose: Nose normal.  Eyes:     Extraocular Movements: Extraocular movements intact.  Conjunctiva/sclera: Conjunctivae normal.     Pupils: Pupils are equal, round, and reactive to light.  Neck:     Thyroid: Thyromegaly present.     Vascular: No carotid bruit or JVD.      Comments: Soft tissue swelling R side neck Cardiovascular:     Rate and Rhythm: Normal rate and regular rhythm.     Heart sounds: Normal heart sounds. No murmur heard. Pulmonary:     Effort: Pulmonary effort is normal. No respiratory distress.     Breath sounds: Normal breath sounds. No wheezing or rales.  Chest:     Chest wall: No tenderness.  Abdominal:     General: Abdomen is flat. There is no distension.     Palpations: Abdomen is soft. There is no mass.     Tenderness: There is no abdominal tenderness. There is no right CVA tenderness, left CVA tenderness, guarding or rebound.     Hernia: No hernia is present.  Musculoskeletal:        General: Normal range of motion.     Cervical back: Normal range of motion and neck supple.  Skin:    Findings: No lesion.  Neurological:     General: No focal deficit present.     Mental Status: She is alert and oriented to person, place, and time.     Motor: No weakness.     Gait: Gait normal.     Comments: Walking with cane after hip surgery  Psychiatric:        Mood and Affect: Mood normal.        Behavior: Behavior normal.        Thought Content: Thought content normal.        Judgment: Judgment normal.     No results found for any visits on 12/06/22.  Last CBC Lab Results  Component  Value Date   WBC 9.5 10/27/2022   HGB 14.1 10/27/2022   HCT 43.1 10/27/2022   MCV 102.4 (H) 10/27/2022   MCH 33.5 10/27/2022   RDW 13.5 10/27/2022   PLT 268 10/27/2022   Last metabolic panel Lab Results  Component Value Date   GLUCOSE 97 10/27/2022   NA 139 10/27/2022   K 4.8 10/27/2022   CL 104 10/27/2022   CO2 25 10/27/2022   BUN 20 10/27/2022   CREATININE 0.73 10/27/2022   GFRNONAA >60 10/27/2022   CALCIUM 9.2 10/27/2022   PROT 6.2 01/07/2022   ALBUMIN 4.3 01/07/2022   BILITOT 0.4 01/07/2022   ALKPHOS 71 01/07/2022   AST 18 01/07/2022   ALT 13 01/07/2022   ANIONGAP 10 10/27/2022   Last lipids Lab Results  Component Value Date   CHOL 189 01/07/2022   HDL 68.40 01/07/2022   LDLCALC 91 01/07/2022   LDLDIRECT 84.0 12/14/2020   TRIG 149.0 01/07/2022   CHOLHDL 3 01/07/2022   Last hemoglobin A1c Lab Results  Component Value Date   HGBA1C 5.8 08/13/2012   Last thyroid functions Lab Results  Component Value Date   TSH 4.64 01/07/2022   Last vitamin D Lab Results  Component Value Date   VD25OH 23.76 (L) 08/26/2014   Last vitamin B12 and Folate No results found for: "VITAMINB12", "FOLATE"    The ASCVD Risk score (Arnett DK, et al., 2019) failed to calculate for the following reasons:   The 2019 ASCVD risk score is only valid for ages 37 to 39    Assessment & Plan:   Problem List Items Addressed This Visit  Unprioritized   Age-related osteoporosis without current pathological fracture   Relevant Medications   alendronate (FOSAMAX) 70 MG tablet   Hyperlipidemia    Tolerating statin, encouraged heart healthy diet, avoid trans fats, minimize simple carbs and saturated fats. Increase exercise as tolerated       Relevant Orders   Lipid panel   Comprehensive metabolic panel   CBC with Differential/Platelet   Hypothyroidism    Check labs  Swelling R side neck US thyroid ordered       Relevant Orders   CBC with Differential/Platelet   TSH    US THYROID   Left hip pain    S/p hip replacement  Wounds healing well  Doing exercise at home       Preventative health care - Primary    Ghm utd Check labs  See AVS  Health Maintenance  Topic Date Due  . COVID-19 Vaccine (2 - 2023-24 season) 03/04/2022  . Medicare Annual Wellness (AWV)  09/14/2022  . MAMMOGRAM  09/22/2022  . INFLUENZA VACCINE  02/02/2023  . DTaP/Tdap/Td (2 - Td or Tdap) 03/27/2029  . Pneumonia Vaccine 30+ Years old  Completed  . DEXA SCAN  Completed  . Zoster Vaccines- Shingrix  Completed  . HPV VACCINES  Aged Out        Other Visit Diagnoses     Enlarged thyroid       Relevant Orders   US THYROID       Return in about 6 months (around 06/07/2023), or if symptoms worsen or fail to improve, for awv with RN.    Donato Schultz, DO

## 2022-12-06 NOTE — Assessment & Plan Note (Signed)
Tolerating statin, encouraged heart healthy diet, avoid trans fats, minimize simple carbs and saturated fats. Increase exercise as tolerated 

## 2022-12-06 NOTE — Patient Instructions (Signed)
Preventive Care 65 Years and Older, Female Preventive care refers to lifestyle choices and visits with your health care provider that can promote health and wellness. Preventive care visits are also called wellness exams. What can I expect for my preventive care visit? Counseling Your health care provider may ask you questions about your: Medical history, including: Past medical problems. Family medical history. Pregnancy and menstrual history. History of falls. Current health, including: Memory and ability to understand (cognition). Emotional well-being. Home life and relationship well-being. Sexual activity and sexual health. Lifestyle, including: Alcohol, nicotine or tobacco, and drug use. Access to firearms. Diet, exercise, and sleep habits. Work and work environment. Sunscreen use. Safety issues such as seatbelt and bike helmet use. Physical exam Your health care provider will check your: Height and weight. These may be used to calculate your BMI (body mass index). BMI is a measurement that tells if you are at a healthy weight. Waist circumference. This measures the distance around your waistline. This measurement also tells if you are at a healthy weight and may help predict your risk of certain diseases, such as type 2 diabetes and high blood pressure. Heart rate and blood pressure. Body temperature. Skin for abnormal spots. What immunizations do I need?  Vaccines are usually given at various ages, according to a schedule. Your health care provider will recommend vaccines for you based on your age, medical history, and lifestyle or other factors, such as travel or where you work. What tests do I need? Screening Your health care provider may recommend screening tests for certain conditions. This may include: Lipid and cholesterol levels. Hepatitis C test. Hepatitis B test. HIV (human immunodeficiency virus) test. STI (sexually transmitted infection) testing, if you are at  risk. Lung cancer screening. Colorectal cancer screening. Diabetes screening. This is done by checking your blood sugar (glucose) after you have not eaten for a while (fasting). Mammogram. Talk with your health care provider about how often you should have regular mammograms. BRCA-related cancer screening. This may be done if you have a family history of breast, ovarian, tubal, or peritoneal cancers. Bone density scan. This is done to screen for osteoporosis. Talk with your health care provider about your test results, treatment options, and if necessary, the need for more tests. Follow these instructions at home: Eating and drinking  Eat a diet that includes fresh fruits and vegetables, whole grains, lean protein, and low-fat dairy products. Limit your intake of foods with high amounts of sugar, saturated fats, and salt. Take vitamin and mineral supplements as recommended by your health care provider. Do not drink alcohol if your health care provider tells you not to drink. If you drink alcohol: Limit how much you have to 0-1 drink a day. Know how much alcohol is in your drink. In the [place] one drink equals one 12 oz bottle of beer (355 mL), one 5 oz glass of wine (148 mL), or one 1 oz glass of hard liquor (44 mL). Lifestyle Brush your teeth every morning and night with fluoride toothpaste. Floss one time each day. Exercise for at least 30 minutes 5 or more days each week. Do not use any products that contain nicotine or tobacco. These products include cigarettes, chewing tobacco, and vaping devices, such as e-cigarettes. If you need help quitting, ask your health care provider. Do not use drugs. If you are sexually active, practice safe sex. Use a condom or other form of protection in order to prevent STIs. Take aspirin only as told by   your health care provider. Make sure that you understand how much to take and what form to take. Work with your health care provider to find out whether it  is safe and beneficial for you to take aspirin daily. Ask your health care provider if you need to take a cholesterol-lowering medicine (statin). Find healthy ways to manage stress, such as: Meditation, yoga, or listening to music. Journaling. Talking to a trusted person. Spending time with friends and family. Minimize exposure to UV radiation to reduce your risk of skin cancer. Safety Always wear your seat belt while driving or riding in a vehicle. Do not drive: If you have been drinking alcohol. Do not ride with someone who has been drinking. When you are tired or distracted. While texting. If you have been using any mind-altering substances or drugs. Wear a helmet and other protective equipment during sports activities. If you have firearms in your house, make sure you follow all gun safety procedures. What's next? Visit your health care provider once a year for an annual wellness visit. Ask your health care provider how often you should have your eyes and teeth checked. Stay up to date on all vaccines. This information is not intended to replace advice given to you by your health care provider. Make sure you discuss any questions you have with your health care provider. Document Revised: 12/16/2020 Document Reviewed: 12/16/2020 Elsevier Patient Education  2024 Elsevier Inc.  

## 2022-12-06 NOTE — Assessment & Plan Note (Signed)
Ghm utd Check labs  See AVS  Health Maintenance  Topic Date Due   COVID-19 Vaccine (2 - 2023-24 season) 03/04/2022   Medicare Annual Wellness (AWV)  09/14/2022   MAMMOGRAM  09/22/2022   INFLUENZA VACCINE  02/02/2023   DTaP/Tdap/Td (2 - Td or Tdap) 03/27/2029   Pneumonia Vaccine 19+ Years old  Completed   DEXA SCAN  Completed   Zoster Vaccines- Shingrix  Completed   HPV VACCINES  Aged Out

## 2022-12-06 NOTE — Assessment & Plan Note (Signed)
Check labs  Swelling R side neck US thyroid ordered

## 2022-12-19 DIAGNOSIS — M1612 Unilateral primary osteoarthritis, left hip: Secondary | ICD-10-CM | POA: Diagnosis not present

## 2022-12-27 ENCOUNTER — Encounter (HOSPITAL_BASED_OUTPATIENT_CLINIC_OR_DEPARTMENT_OTHER): Payer: Self-pay

## 2022-12-27 ENCOUNTER — Ambulatory Visit (HOSPITAL_BASED_OUTPATIENT_CLINIC_OR_DEPARTMENT_OTHER)
Admission: RE | Admit: 2022-12-27 | Discharge: 2022-12-27 | Disposition: A | Discharge status: Home / Self Care | Payer: Medicare HMO | Source: Ambulatory Visit | Attending: Family Medicine | Admitting: Family Medicine

## 2022-12-27 DIAGNOSIS — Z1231 Encounter for screening mammogram for malignant neoplasm of breast: Secondary | ICD-10-CM | POA: Diagnosis not present

## 2022-12-27 DIAGNOSIS — E049 Nontoxic goiter, unspecified: Secondary | ICD-10-CM | POA: Insufficient documentation

## 2022-12-27 DIAGNOSIS — E039 Hypothyroidism, unspecified: Secondary | ICD-10-CM | POA: Diagnosis not present

## 2023-01-16 DIAGNOSIS — M1612 Unilateral primary osteoarthritis, left hip: Secondary | ICD-10-CM | POA: Diagnosis not present

## 2023-01-16 DIAGNOSIS — M5432 Sciatica, left side: Secondary | ICD-10-CM | POA: Diagnosis not present

## 2023-01-24 DIAGNOSIS — H353211 Exudative age-related macular degeneration, right eye, with active choroidal neovascularization: Secondary | ICD-10-CM | POA: Diagnosis not present

## 2023-01-24 DIAGNOSIS — H353223 Exudative age-related macular degeneration, left eye, with inactive scar: Secondary | ICD-10-CM | POA: Diagnosis not present

## 2023-01-24 DIAGNOSIS — H43813 Vitreous degeneration, bilateral: Secondary | ICD-10-CM | POA: Diagnosis not present

## 2023-01-26 DIAGNOSIS — M48062 Spinal stenosis, lumbar region with neurogenic claudication: Secondary | ICD-10-CM | POA: Diagnosis not present

## 2023-02-13 DIAGNOSIS — M1612 Unilateral primary osteoarthritis, left hip: Secondary | ICD-10-CM | POA: Diagnosis not present

## 2023-03-02 DIAGNOSIS — M48062 Spinal stenosis, lumbar region with neurogenic claudication: Secondary | ICD-10-CM | POA: Diagnosis not present

## 2023-03-30 ENCOUNTER — Other Ambulatory Visit: Payer: Self-pay | Admitting: Family Medicine

## 2023-03-30 DIAGNOSIS — E785 Hyperlipidemia, unspecified: Secondary | ICD-10-CM

## 2023-04-11 DIAGNOSIS — H26493 Other secondary cataract, bilateral: Secondary | ICD-10-CM | POA: Diagnosis not present

## 2023-04-11 DIAGNOSIS — H353223 Exudative age-related macular degeneration, left eye, with inactive scar: Secondary | ICD-10-CM | POA: Diagnosis not present

## 2023-04-11 DIAGNOSIS — H353211 Exudative age-related macular degeneration, right eye, with active choroidal neovascularization: Secondary | ICD-10-CM | POA: Diagnosis not present

## 2023-04-11 DIAGNOSIS — H43813 Vitreous degeneration, bilateral: Secondary | ICD-10-CM | POA: Diagnosis not present

## 2023-05-01 DIAGNOSIS — M48062 Spinal stenosis, lumbar region with neurogenic claudication: Secondary | ICD-10-CM | POA: Diagnosis not present

## 2023-05-17 DIAGNOSIS — M1612 Unilateral primary osteoarthritis, left hip: Secondary | ICD-10-CM | POA: Diagnosis not present

## 2023-05-30 DIAGNOSIS — M48062 Spinal stenosis, lumbar region with neurogenic claudication: Secondary | ICD-10-CM | POA: Diagnosis not present

## 2023-06-08 ENCOUNTER — Other Ambulatory Visit: Payer: Self-pay | Admitting: Family Medicine

## 2023-06-08 DIAGNOSIS — G4700 Insomnia, unspecified: Secondary | ICD-10-CM

## 2023-07-04 DIAGNOSIS — H43813 Vitreous degeneration, bilateral: Secondary | ICD-10-CM | POA: Diagnosis not present

## 2023-07-04 DIAGNOSIS — H353223 Exudative age-related macular degeneration, left eye, with inactive scar: Secondary | ICD-10-CM | POA: Diagnosis not present

## 2023-07-04 DIAGNOSIS — H353211 Exudative age-related macular degeneration, right eye, with active choroidal neovascularization: Secondary | ICD-10-CM | POA: Diagnosis not present

## 2023-07-04 DIAGNOSIS — H26493 Other secondary cataract, bilateral: Secondary | ICD-10-CM | POA: Diagnosis not present

## 2023-07-31 ENCOUNTER — Other Ambulatory Visit: Payer: Self-pay | Admitting: Family Medicine

## 2023-07-31 DIAGNOSIS — E785 Hyperlipidemia, unspecified: Secondary | ICD-10-CM

## 2023-07-31 DIAGNOSIS — K219 Gastro-esophageal reflux disease without esophagitis: Secondary | ICD-10-CM

## 2023-08-01 ENCOUNTER — Encounter: Payer: Self-pay | Admitting: *Deleted

## 2023-08-02 DIAGNOSIS — M48062 Spinal stenosis, lumbar region with neurogenic claudication: Secondary | ICD-10-CM | POA: Diagnosis not present

## 2023-09-12 ENCOUNTER — Ambulatory Visit: Payer: Self-pay | Admitting: Family Medicine

## 2023-09-12 NOTE — Telephone Encounter (Signed)
  Chief Complaint: left knee pain  Symptoms: left knee pain, issues walking , warm to touch. No swelling . No strength in leg. Using "aleeve rub" for pain  . Pain level 7/10. No sleeping well aches and worse in mornings  Frequency: 1 week  Pertinent Negatives: Patient denies chest pain no difficulty breathing no radiating pain no swelling no redness reported no fever Disposition: [] ED /[] Urgent Care (no appt availability in office) / [x] Appointment(In office/virtual)/ []  [facility]/ [] Home Care/ [] Refused Recommended Disposition /[] [facility]/ []  Follow-up with PCP Additional Notes:   Appt scheduled 09/14/23.     Copied from CRM [id number]. Topic: Clinical - Red Word Triage >> Sep 12, 2023  8:04 AM Elizebeth Brooking wrote: [facility] that prompted transfer to Nurse Triage: Patient stated she has a lot of pain in her knee , can hardly walk Reason for Disposition  [1] MODERATE pain (e.g., interferes with normal activities, limping) AND [2] present > 3 days  Answer Assessment - Initial Assessment Questions 1. LOCATION and RADIATION: "Where is the pain located?"      Left knee  2. QUALITY: "What does the pain feel like?"  (e.g., sharp, dull, aching, burning)     ache 3. SEVERITY: "How bad is the pain?" "What does it keep you from doing?"   (Scale 1-10; or mild, moderate, severe)   -  MILD (1-3): doesn't interfere with normal activities    -  MODERATE (4-7): interferes with normal activities (e.g., work or school) or awakens from sleep, limping    -  SEVERE (8-10): excruciating pain, unable to do any normal activities, unable to walk     7/10 up in mornings worse not sleeping well 4. ONSET: "When did the pain start?" "Does it come and go, or is it there all the time?"    1 week  5. RECURRENT: "Have you had this pain before?" If Yes, ask: "When, and what happened then?"     no 6. SETTING: "Has there been any recent work, exercise or other activity that involved that part of the  body?"      na 7. AGGRAVATING FACTORS: "What makes the knee pain worse?" (e.g., walking, climbing stairs, running)     Walking climbing stairs  8. ASSOCIATED SYMPTOMS: "Is there any swelling or redness of the knee?"     Warm to touch 9. OTHER SYMPTOMS: "Do you have any other symptoms?" (e.g., chest pain, difficulty breathing, fever, calf pain)     Left knee 10. PREGNANCY: "Is there any chance you are pregnant?" "When was your last menstrual period?"       na  Protocols used: Knee Pain-A-AH

## 2023-09-14 ENCOUNTER — Encounter: Payer: Self-pay | Admitting: Family Medicine

## 2023-09-14 ENCOUNTER — Ambulatory Visit (INDEPENDENT_AMBULATORY_CARE_PROVIDER_SITE_OTHER): Admitting: Family Medicine

## 2023-09-14 ENCOUNTER — Ambulatory Visit (HOSPITAL_BASED_OUTPATIENT_CLINIC_OR_DEPARTMENT_OTHER)
Admission: RE | Admit: 2023-09-14 | Discharge: 2023-09-14 | Disposition: A | Discharge status: Home / Self Care | Source: Ambulatory Visit | Attending: Family Medicine | Admitting: Family Medicine

## 2023-09-14 VITALS — BP 128/60 | HR 78 | Temp 97.6°F | Resp 16 | Ht 64.0 in | Wt 146.8 lb

## 2023-09-14 DIAGNOSIS — Z471 Aftercare following joint replacement surgery: Secondary | ICD-10-CM | POA: Diagnosis not present

## 2023-09-14 DIAGNOSIS — M47816 Spondylosis without myelopathy or radiculopathy, lumbar region: Secondary | ICD-10-CM | POA: Diagnosis not present

## 2023-09-14 DIAGNOSIS — M25562 Pain in left knee: Secondary | ICD-10-CM | POA: Insufficient documentation

## 2023-09-14 DIAGNOSIS — M25559 Pain in unspecified hip: Secondary | ICD-10-CM | POA: Diagnosis not present

## 2023-09-14 DIAGNOSIS — I700 Atherosclerosis of aorta: Secondary | ICD-10-CM | POA: Diagnosis not present

## 2023-09-14 DIAGNOSIS — M25552 Pain in left hip: Secondary | ICD-10-CM

## 2023-09-14 DIAGNOSIS — G8929 Other chronic pain: Secondary | ICD-10-CM | POA: Diagnosis not present

## 2023-09-14 DIAGNOSIS — M5442 Lumbago with sciatica, left side: Secondary | ICD-10-CM | POA: Diagnosis not present

## 2023-09-14 DIAGNOSIS — Z96643 Presence of artificial hip joint, bilateral: Secondary | ICD-10-CM | POA: Diagnosis not present

## 2023-09-14 DIAGNOSIS — M1712 Unilateral primary osteoarthritis, left knee: Secondary | ICD-10-CM | POA: Diagnosis not present

## 2023-09-14 MED ORDER — MELOXICAM 7.5 MG PO TABS
ORAL_TABLET | ORAL | 0 refills | Status: DC
Start: 2023-09-14 — End: 2023-10-11

## 2023-09-14 NOTE — Progress Notes (Signed)
 Established Patient Office Visit  Subjective   Patient ID: Joanna Williams, female    DOB: 02-Apr-1941  Age: 83 y.o. MRN: [id number]  Chief Complaint  Patient presents with   Knee Pain    Left knee, x2 weeks, no falls or injuries.  Some swelling, no redness, pt states using knee brace and aleve at home.    HPI Discussed the use of AI scribe software for clinical note transcription with the patient, who gave verbal consent to proceed.  History of Present Illness   The patient presents with knee and back pain.  She experiences significant knee pain, particularly at night, which has progressively worsened. The pain is localized towards the back and side of the knee. She has not undergone any recent imaging studies for the knee, and this issue is new for her.  She describes ongoing back pain for which she is receiving injections from Dr. Belva Bertin. The injections provide insufficient relief. The back pain radiates to her knee, causing difficulty in movement, especially when rising from a seated position. She had an MRI three years ago but not recently.  She underwent hip surgery in May of the previous year and reports a lumpy sensation and pain in the surgical area. An X-ray at Dr. Louellen Molder office suggested a possible blood pocket, but she continues to experience discomfort, particularly when turning in bed.  The pain affects her daily activities, preventing her from playing pickleball, which has led to a weight gain of seven pounds. She is concerned about her balance and the risk of falling, although she does not report specific balance issues. She uses Aleve rub for pain relief, which provides some help, but other medications are not significantly effective. A steroid pack prescribed in the past offered temporary relief.  No spasms or sudden grabbing pain. The pain does not radiate to the groin and is not significantly swollen. No pain when lifting her leg, except when pushing away, which  causes pain in the back.      Patient Active Problem List   Diagnosis Date Noted   Left hip pain 09/26/2022   Insomnia 09/26/2022   Pre-op examination 09/26/2022   Preventative health care 07/18/2021   Age-related osteoporosis without current pathological fracture 06/04/2019   Murmur 06/04/2019   Tongue lesion 06/04/2019   Low back pain with radiation 06/04/2019   Right lower quadrant abdominal mass 01/24/2019   Carpal tunnel syndrome on both sides 11/06/2017   UTI (urinary tract infection) 05/12/2014   Sciatica 02/04/2014   GERD (gastroesophageal reflux disease) 08/13/2012   Hypothyroidism 06/15/2010   Benign neoplasm of skin 11/27/2009   Hyperlipidemia 02/13/2007   Past Medical History:  Diagnosis Date   GERD (gastroesophageal reflux disease)    Hyperlipidemia    Macular degeneration 05/04/2017   Past Surgical History:  Procedure Laterality Date   COLONOSCOPY  2 yrs ago   EYE SURGERY Bilateral 2014   cataract   TOTAL HIP ARTHROPLASTY  08/10/2011   Procedure: TOTAL HIP ARTHROPLASTY;  Surgeon: Nestor Lewandowsky, MD;  Location: [facility] OR;  Service: Orthopedics;  Laterality: Right;  DEPUY/ PENNACLE POLY OR CERAMIC   TOTAL HIP ARTHROPLASTY Left 11/08/2022   Procedure: TOTAL HIP ARTHROPLASTY;  Surgeon: Teryl Lucy, MD;  Location: [facility];  Service: Orthopedics;  Laterality: Left;   Social History   Tobacco Use   Smoking status: Never   Smokeless tobacco: Never  Vaping Use   Vaping status: Never Used  Substance Use Topics   Alcohol use:  Yes    Alcohol/week: 1.0 standard drink of alcohol    Types: 1 Glasses of wine per week    Comment: rare   Drug use: Yes    Types: Hydrocodone   Social History   Socioeconomic History   Marital status: Married    Spouse name: Not on file   Number of children: Not on file   Years of education: Not on file   Highest education level: Not on file  Occupational History   Occupation: retired    Associate Professor: RETIRED  Tobacco Use   Smoking  status: Never   Smokeless tobacco: Never  Vaping Use   Vaping status: Never Used  Substance and Sexual Activity   Alcohol use: Yes    Alcohol/week: 1.0 standard drink of alcohol    Types: 1 Glasses of wine per week    Comment: rare   Drug use: Yes    Types: Hydrocodone   Sexual activity: Yes    Partners: Male  Other Topics Concern   Not on file  Social History Narrative   Walking, yoga, pickle ball   Social Drivers of Health   Financial Resource Strain: Low Risk  (09/13/2021)   Overall Financial Resource Strain (CARDIA)    Difficulty of Paying Living Expenses: Not hard at all  Food Insecurity: No Food Insecurity (09/13/2021)   Hunger Vital Sign    Worried About Running Out of Food in the Last Year: Never true    Ran Out of Food in the Last Year: Never true  Transportation Needs: No Transportation Needs (09/13/2021)   PRAPARE - Administrator, Civil Service (Medical): No    Lack of Transportation (Non-Medical): No  Physical Activity: Sufficiently Active (09/13/2021)   Exercise Vital Sign    Days of Exercise per Week: 7 days    Minutes of Exercise per Session: 60 min  Stress: No Stress Concern Present (09/13/2021)   [employer] of Occupational Health - Occupational Stress Questionnaire    Feeling of Stress : Not at all  Social Connections: Moderately Integrated (09/13/2021)   Social Connection and Isolation Panel [NHANES]    Frequency of Communication with Friends and Family: More than three times a week    Frequency of Social Gatherings with Friends and Family: More than three times a week    Attends Religious Services: More than 4 times per year    Active Member of [employer] or Organizations: No    Attends Banker Meetings: Never    Marital Status: Married  Catering manager Violence: Not At Risk (09/13/2021)   Humiliation, Afraid, Rape, and Kick questionnaire    Fear of Current or Ex-Partner: No    Emotionally Abused: No    Physically Abused: No     Sexually Abused: No   Family Status  Relation Name Status   Mother  Deceased at age 37   Father  Deceased   Sister  Alive   Brother  Alive   Sister  Alive   Sister  Alive   Sister  Alive  No partnership data on file   Family History  Problem Relation Age of Onset   Heart disease Mother    Cancer Father 10       brain   Allergies  Allergen Reactions   Niacin Nausea Only      Review of Systems  Constitutional:  Negative for chills, fever and malaise/fatigue.  HENT:  Negative for congestion and hearing loss.   Eyes:  Negative for  blurred vision and discharge.  Respiratory:  Negative for cough, sputum production and shortness of breath.   Cardiovascular:  Negative for chest pain, palpitations and leg swelling.  Gastrointestinal:  Negative for abdominal pain, blood in stool, constipation, diarrhea, heartburn, nausea and vomiting.  Genitourinary:  Negative for dysuria, frequency, hematuria and urgency.  Musculoskeletal:  Positive for joint pain. Negative for back pain, falls and myalgias.  Skin:  Negative for rash.  Neurological:  Negative for dizziness, sensory change, loss of consciousness, weakness and headaches.  Endo/Heme/Allergies:  Negative for environmental allergies. Does not bruise/bleed easily.  Psychiatric/Behavioral:  Negative for depression and suicidal ideas. The patient is not nervous/anxious and does not have insomnia.       Objective:     BP 128/60 (BP Location: Left Arm, Patient Position: Sitting, Cuff Size: Normal)   Pulse 78   Temp 97.6 F (36.4 C) (Oral)   Resp 16   Ht 5\' 4"  (1.626 m)   Wt 146 lb 12.8 oz (66.6 kg)   SpO2 94%   BMI 25.20 kg/m  BP Readings from Last 3 Encounters:  09/14/23 128/60  12/06/22 126/72  11/08/22 113/87   Wt Readings from Last 3 Encounters:  09/14/23 146 lb 12.8 oz (66.6 kg)  12/06/22 134 lb 12.8 oz (61.1 kg)  11/08/22 139 lb (63 kg)   SpO2 Readings from Last 3 Encounters:  09/14/23 94%  12/06/22 96%   11/08/22 98%      Physical Exam Vitals and nursing note reviewed.  Constitutional:      General: She is not in acute distress.    Appearance: Normal appearance. She is well-developed.  HENT:     Head: Normocephalic and atraumatic.  Eyes:     General: No scleral icterus.       Right eye: No discharge.        Left eye: No discharge.  Cardiovascular:     Rate and Rhythm: Normal rate and regular rhythm.     Heart sounds: No murmur heard. Pulmonary:     Effort: Pulmonary effort is normal. No respiratory distress.     Breath sounds: Normal breath sounds.  Musculoskeletal:        General: No swelling or tenderness. Normal range of motion.     Cervical back: Normal range of motion and neck supple.     Right lower leg: No edema.     Left lower leg: No edema.  Skin:    General: Skin is warm and dry.  Neurological:     Mental Status: She is alert and oriented to person, place, and time.  Psychiatric:        Mood and Affect: Mood normal.        Behavior: Behavior normal.        Thought Content: Thought content normal.        Judgment: Judgment normal.      No results found for any visits on 09/14/23.  Last CBC Lab Results  Component Value Date   WBC 7.4 12/06/2022   HGB 12.9 12/06/2022   HCT 39.8 12/06/2022   MCV 98.8 12/06/2022   MCH 33.5 10/27/2022   RDW 13.1 12/06/2022   PLT 269.0 12/06/2022   Last metabolic panel Lab Results  Component Value Date   GLUCOSE 88 12/06/2022   NA 138 12/06/2022   K 4.6 12/06/2022   CL 100 12/06/2022   CO2 30 12/06/2022   BUN 18 12/06/2022   CREATININE 0.67 12/06/2022   GFR 81.09 12/06/2022  CALCIUM 9.2 12/06/2022   PROT 6.2 12/06/2022   ALBUMIN 4.0 12/06/2022   BILITOT 0.5 12/06/2022   ALKPHOS 102 12/06/2022   AST 19 12/06/2022   ALT 10 12/06/2022   ANIONGAP 10 10/27/2022   Last lipids Lab Results  Component Value Date   CHOL 160 12/06/2022   HDL 52.30 12/06/2022   LDLCALC 76 12/06/2022   LDLDIRECT 84.0 12/14/2020    TRIG 158.0 (H) 12/06/2022   CHOLHDL 3 12/06/2022   Last hemoglobin A1c Lab Results  Component Value Date   HGBA1C 5.8 08/13/2012   Last thyroid functions Lab Results  Component Value Date   TSH 3.45 12/06/2022   Last vitamin D Lab Results  Component Value Date   VD25OH 23.76 (L) 08/26/2014   Last vitamin B12 and Folate No results found for: "VITAMINB12", "FOLATE"    The ASCVD Risk score (Arnett DK, et al., 2019) failed to calculate for the following reasons:   The 2019 ASCVD risk score is only valid for ages 71 to 46    Assessment & Plan:   Problem List Items Addressed This Visit       Unprioritized   Left hip pain - Primary   Relevant Medications   meloxicam (MOBIC) 7.5 MG tablet   Other Relevant Orders   DG HIP UNILAT W OR W/O PELVIS 2-3 VIEWS LEFT   DG Lumbar Spine Complete   Lipid panel   Other Visit Diagnoses       Chronic left-sided low back pain with left-sided sciatica       Relevant Medications   meloxicam (MOBIC) 7.5 MG tablet   Other Relevant Orders   DG HIP UNILAT W OR W/O PELVIS 2-3 VIEWS LEFT   DG Lumbar Spine Complete   Lipid panel     Chronic pain of left knee       Relevant Medications   meloxicam (MOBIC) 7.5 MG tablet   Other Relevant Orders   DG Knee 1-2 Views Left     Assessment and Plan    Back Pain   Chronic back pain radiates to the knee, suggesting possible nerve impingement or spinal issues. Current injections by Dr. Belva Bertin provide only temporary relief. Pain affects mobility and activities like pickleball, contributing to weight gain and raising concerns about balance and fall risk. Anti-inflammatory medication may help if the pain is inflammatory, but not if nerve-related. Order an x-ray of the back. Consult with Dr. Louellen Molder PA regarding current treatment efficacy and potential MRI necessity. Consider a neurosurgeon referral if the MRI indicates a worsening condition.  Knee Pain   Persistent knee pain, especially at  night, impairs rising from a seated position. Pain is localized to the posterior and lateral knee with no history of injury. It may be referred from the back or a primary knee issue. Order an x-ray of the knee. Prescribe anti-inflammatory medication once daily, with an option for an additional dose if needed.  Hip Pain   Post-hip surgery pain occurs particularly during bed turning. A lumpy sensation at the surgical site is identified as a hematoma. Pain may be due to arthritis or post-surgical changes. Order an x-ray of the hip and evaluate for arthritis or post-surgical changes.  General Health Maintenance   She is engaging in walking and considering weight training for leg strength. A physical exam is scheduled for June 5th with labs planned. Encourage walking and weight training. Conduct labs during the June 5th physical exam.        No follow-ups  on file.    Donato Schultz, DO

## 2023-09-26 DIAGNOSIS — H353211 Exudative age-related macular degeneration, right eye, with active choroidal neovascularization: Secondary | ICD-10-CM | POA: Diagnosis not present

## 2023-09-26 DIAGNOSIS — H26493 Other secondary cataract, bilateral: Secondary | ICD-10-CM | POA: Diagnosis not present

## 2023-09-26 DIAGNOSIS — H43813 Vitreous degeneration, bilateral: Secondary | ICD-10-CM | POA: Diagnosis not present

## 2023-09-26 DIAGNOSIS — H353223 Exudative age-related macular degeneration, left eye, with inactive scar: Secondary | ICD-10-CM | POA: Diagnosis not present

## 2023-09-29 ENCOUNTER — Encounter: Payer: Self-pay | Admitting: Family

## 2023-10-11 ENCOUNTER — Other Ambulatory Visit: Payer: Self-pay | Admitting: Family Medicine

## 2023-10-11 DIAGNOSIS — G8929 Other chronic pain: Secondary | ICD-10-CM

## 2023-10-11 DIAGNOSIS — M25552 Pain in left hip: Secondary | ICD-10-CM

## 2023-10-25 DIAGNOSIS — G5601 Carpal tunnel syndrome, right upper limb: Secondary | ICD-10-CM | POA: Diagnosis not present

## 2023-10-25 DIAGNOSIS — M79642 Pain in left hand: Secondary | ICD-10-CM | POA: Diagnosis not present

## 2023-10-26 ENCOUNTER — Ambulatory Visit

## 2023-10-26 DIAGNOSIS — Z0000 Encounter for general adult medical examination without abnormal findings: Secondary | ICD-10-CM

## 2023-10-26 NOTE — Patient Instructions (Signed)
 Joanna Williams , Thank you for taking time to come for your Medicare Wellness Visit. I appreciate your ongoing commitment to your health goals. Please review the following plan we discussed and let me know if I can assist you in the future.   Referrals/Orders/Follow-Ups/Clinician Recommendations: none  This is a list of the screening recommended for you and due dates:  Health Maintenance  Topic Date Due   COVID-19 Vaccine (3 - 2024-25 season) 09/18/2023   Mammogram  12/27/2023   Flu Shot  02/02/2024   Medicare Annual Wellness Visit  10/25/2024   DTaP/Tdap/Td vaccine (2 - Td or Tdap) 03/27/2029   Pneumonia Vaccine  Completed   DEXA scan (bone density measurement)  Completed   Zoster (Shingles) Vaccine  Completed   HPV Vaccine  Aged Out   Meningitis B Vaccine  Aged Out    Advanced directives: (Copy Requested) Please bring a copy of your health care power of attorney and living will to the office to be added to your chart at your convenience. You can mail to [facility] [address] [place], [place] [address] or email to [email or web address]  Next Medicare Annual Wellness Visit scheduled for next year: Yes  insert Preventive Care attachment Insert FALL PREVENTION attachment if needed

## 2023-10-26 NOTE — Progress Notes (Signed)
 Subjective:   Joanna Williams is a 83 y.o. who presents for a Medicare Wellness preventive visit.  Visit Complete: Virtual I connected with  Joanna Williams on 10/26/23 by a audio enabled telemedicine application and verified that I am speaking with the correct person using two identifiers.  Patient Location: Home  Provider Location: Home Office  I discussed the limitations of evaluation and management by telemedicine. The patient expressed understanding and agreed to proceed.  Vital Signs: Because this visit was a virtual/telehealth visit, some criteria may be missing or patient reported. Any vitals not documented were not able to be obtained and vitals that have been documented are patient reported.  VideoError- Librarian, academic were attempted between this provider and patient, however failed, due to patient having technical difficulties OR patient did not have access to video capability.  We continued and completed visit with audio only.   Persons Participating in Visit: Patient.  AWV Questionnaire: No: Patient Medicare AWV questionnaire was not completed prior to this visit.  Cardiac Risk Factors include: advanced age (>22men, >52 women);dyslipidemia     Objective:    Today's Vitals   10/26/23 1047  PainSc: 7    There is no height or weight on file to calculate BMI.     10/26/2023   10:56 AM 10/27/2022   11:41 AM 09/13/2021    8:28 AM 08/10/2011   10:06 AM 08/03/2011    9:18 AM  Advanced Directives  Does Patient Have a Medical Advance Directive? No Yes Yes  Patient has advance directive, copy not in chart  Type of Advance Directive  Living will Healthcare Power of [place];Out of facility DNR (pink MOST or yellow form);Living will  Living will  Copy of Healthcare Power of Attorney in Chart?   No - copy requested  Copy requested from family  Would patient like information on creating a medical advance directive? No - Patient declined       Pre-existing out of facility DNR order (yellow form or pink MOST form)    No     Current Medications (verified) Outpatient Encounter Medications as of 10/26/2023  Medication Sig   acetaminophen  (TYLENOL ) 650 MG CR tablet Take 1,300 mg by mouth every 8 (eight) hours as needed for pain.   atorvastatin  (LIPITOR) 40 MG tablet TAKE 1 TABLET BY MOUTH EVERY DAY   ibuprofen (ADVIL) 200 MG tablet Take 400 mg by mouth every 6 (six) hours as needed for moderate pain.   meloxicam  (MOBIC ) 7.5 MG tablet TAKE 1 TO 2 TABLETS BY MOUTH DAILY   Multiple Vitamins-Minerals (PRESERVISION AREDS 2+MULTI VIT PO) Take 2 tablets by mouth at bedtime.   omeprazole  (PRILOSEC ) 20 MG capsule TAKE 1 CAPSULE BY MOUTH EVERY DAY   Propylene Glycol (SYSTANE COMPLETE) 0.6 % SOLN Place 1 drop into both eyes daily as needed (dry eyes).   trolamine salicylate (ASPERCREME) 10 % cream Apply 1 Application topically as needed for muscle pain.   zolpidem  (AMBIEN ) 5 MG tablet TAKE 1 TABLET BY MOUTH EVERYDAY AT BEDTIME   alendronate  (FOSAMAX ) 70 MG tablet Take 1 tablet (70 mg total) by mouth every 7 (seven) days. Take with a full glass of water  on an empty stomach. (Patient not taking: Reported on 10/26/2023)   aspirin  EC 325 MG tablet Take 1 tablet (325 mg total) by mouth 2 (two) times daily. (Patient not taking: Reported on 10/26/2023)   oxyCODONE  (ROXICODONE ) 5 MG immediate release tablet Take 1 tablet (5 mg total) by  mouth every 4 (four) hours as needed for severe pain. (Patient not taking: Reported on 10/26/2023)   sennosides-docusate sodium  (SENOKOT-S) 8.6-50 MG tablet Take 2 tablets by mouth daily. (Patient not taking: Reported on 10/26/2023)   No facility-administered encounter medications on file as of 10/26/2023.    Allergies (verified) Niacin   History: Past Medical History:  Diagnosis Date   GERD (gastroesophageal reflux disease)    Hyperlipidemia    Macular degeneration 05/04/2017   Past Surgical History:  Procedure  Laterality Date   COLONOSCOPY  2 yrs ago   EYE SURGERY Bilateral 2014   cataract   TOTAL HIP ARTHROPLASTY  08/10/2011   Procedure: TOTAL HIP ARTHROPLASTY;  Surgeon: Ilean Mall, MD;  Location: [facility] OR;  Service: Orthopedics;  Laterality: Right;  DEPUY/ PENNACLE POLY OR CERAMIC   TOTAL HIP ARTHROPLASTY Left 11/08/2022   Procedure: TOTAL HIP ARTHROPLASTY;  Surgeon: Osa Blase, MD;  Location: [facility];  Service: Orthopedics;  Laterality: Left;   Family History  Problem Relation Age of Onset   Heart disease Mother    Cancer Father 3       brain   Social History   Socioeconomic History   Marital status: Married    Spouse name: Not on file   Number of children: Not on file   Years of education: Not on file   Highest education level: Not on file  Occupational History   Occupation: retired    Associate Professor: RETIRED  Tobacco Use   Smoking status: Never   Smokeless tobacco: Never  Vaping Use   Vaping status: Never Used  Substance and Sexual Activity   Alcohol use: Not Currently    Alcohol/week: 1.0 standard drink of alcohol    Types: 1 Glasses of wine per week    Comment: rare   Drug use: Not Currently    Types: Hydrocodone    Sexual activity: Yes    Partners: Male  Other Topics Concern   Not on file  Social History Narrative   Walking, yoga, pickle ball   Social Drivers of Health   Financial Resource Strain: Low Risk  (10/26/2023)   Overall Financial Resource Strain (CARDIA)    Difficulty of Paying Living Expenses: Not hard at all  Food Insecurity: No Food Insecurity (10/26/2023)   Hunger Vital Sign    Worried About Running Out of Food in the Last Year: Never true    Ran Out of Food in the Last Year: Never true  Transportation Needs: No Transportation Needs (10/26/2023)   PRAPARE - Administrator, Civil Service (Medical): No    Lack of Transportation (Non-Medical): No  Physical Activity: Insufficiently Active (10/26/2023)   Exercise Vital Sign    Days of Exercise  per Week: 3 days    Minutes of Exercise per Session: 30 min  Stress: No Stress Concern Present (10/26/2023)   [employer] of Occupational Health - Occupational Stress Questionnaire    Feeling of Stress : Not at all  Social Connections: Moderately Integrated (10/26/2023)   Social Connection and Isolation Panel [NHANES]    Frequency of Communication with Friends and Family: More than three times a week    Frequency of Social Gatherings with Friends and Family: Three times a week    Attends Religious Services: More than 4 times per year    Active Member of Clubs or Organizations: No    Attends Banker Meetings: Never    Marital Status: Married    Tobacco Counseling Counseling given:  Not Answered    Clinical Intake:  Pre-visit preparation completed: Yes  Pain : 0-10 Pain Score: 7  Pain Type: Chronic pain Pain Location: Back (left hand scale 8-9) Pain Orientation: Lower Pain Radiating Towards: down left leg Pain Descriptors / Indicators: Shooting Pain Onset: More than a month ago Pain Frequency: Constant     Nutritional Risks: None Diabetes: No  Lab Results  Component Value Date   HGBA1C 5.8 08/13/2012     How often do you need to have someone help you when you read instructions, pamphlets, or other written materials from your doctor or pharmacy?: 1 - Never  Interpreter Needed?: No  Information entered by :: NAllen LPN   Activities of Daily Living     10/26/2023   10:49 AM 10/27/2022   11:43 AM  In your present state of health, do you have any difficulty performing the following activities:  Hearing? 0   Vision? 0   Difficulty concentrating or making decisions? 0   Walking or climbing stairs? 0   Dressing or bathing? 0   Doing errands, shopping? 0 0  Preparing Food and eating ? N   Using the Toilet? N   In the past six months, have you accidently leaked urine? N   Do you have problems with loss of bowel control? N   Managing your  Medications? N   Managing your Finances? N   Housekeeping or managing your Housekeeping? N     Patient Care Team: Crecencio Dodge, Candida Chalk, DO as PCP - General Leim Pupa, OD as Referring Physician (Optometry) Leim Pupa, OD as Referring Physician (Optometry) Linard Reno, MD as Consulting Physician (Ophthalmology)  Indicate any recent Medical Services you may have received from other than [facility] providers in the past year (date may be approximate).     Assessment:   This is a routine wellness examination for Merry.  Hearing/Vision screen Hearing Screening - Comments:: Denies hearing issues Vision Screening - Comments:: Regular eye exams, Dr. Elnita Hai   Goals Addressed             This Visit's Progress    Patient Stated       10/26/2023, wants to get carpal tunnel surgery       Depression Screen     10/26/2023   10:57 AM 12/06/2022    9:00 AM 09/26/2022    2:31 PM 09/13/2021    8:26 AM 12/14/2020    8:12 AM 06/09/2020    1:45 PM 03/28/2019    1:33 PM  PHQ 2/9 Scores  PHQ - 2 Score 1 0 0 0 0 0 0  PHQ- 9 Score 1          Fall Risk     10/26/2023   10:57 AM 09/14/2023    8:25 AM 12/06/2022    9:00 AM 09/26/2022    2:31 PM 09/13/2021    8:25 AM  Fall Risk   Falls in the past year? 0 0 0 0 0  Number falls in past yr: 0 0 0 0 0  Injury with Fall? 0 0 0 0 0  Risk for fall due to : Medication side effect  No Fall Risks  No Fall Risks  Follow up Falls prevention discussed;Falls evaluation completed Falls evaluation completed Falls evaluation completed Falls evaluation completed     MEDICARE RISK AT HOME:  Medicare Risk at Home Any stairs in or around the home?: Yes If so, are there any without handrails?: No Home free of loose  throw rugs in walkways, pet beds, electrical cords, etc?: Yes Adequate lighting in your home to reduce risk of falls?: Yes Life alert?: No Use of a cane, walker or w/c?: No Grab bars in the bathroom?: Yes Shower chair or bench in shower?:  Yes Elevated toilet seat or a handicapped toilet?: Yes  TIMED UP AND GO:  Was the test performed?  No  Cognitive Function: 6CIT completed    06/09/2020    2:22 PM 05/03/2016    4:10 PM  MMSE - Mini Mental State Exam  Orientation to time 5 5  Orientation to Place 5 5  Registration 3 3  Attention/ Calculation 5 5  Recall 3 3  Language- name 2 objects 2 2  Language- repeat 1 1  Language- follow 3 step command 3 3  Language- read & follow direction 1 1  Write a sentence 1 1  Copy design 1 1  Total score 30 30        10/26/2023   10:58 AM 09/13/2021    8:33 AM  6CIT Screen  What Year? 0 points 0 points  What month? 0 points 0 points  What time? 0 points 0 points  Count back from 20 0 points 0 points  Months in reverse 2 points 4 points  Repeat phrase 0 points 6 points  Total Score 2 points 10 points    Immunizations Immunization History  Administered Date(s) Administered   Fluad Quad(high Dose 65+) 03/22/2021, 04/19/2022   Influenza Split 04/18/2011   Influenza Whole 04/18/2007, 04/15/2008   Influenza, High Dose Seasonal PF 03/19/2015, 05/03/2016, 05/11/2016, 04/10/2017, 05/10/2018, 02/15/2019, 03/21/2023   Influenza-Unspecified 05/04/2014, 04/10/2017   Moderna Covid-19 Fall Seasonal Vaccine 81yrs & older 03/21/2023   PFIZER(Purple Top)SARS-COV-2 Vaccination 07/23/2019   Pneumococcal Conjugate-13 03/19/2015   Pneumococcal Polysaccharide-23 09/02/2013   Respiratory Syncytial Virus Vaccine,Recomb Aduvanted(Arexvy) 05/18/2022   Tdap 03/28/2019   Zoster Recombinant(Shingrix) 12/30/2019, 03/11/2020   Zoster, Live 06/25/2008    Screening Tests Health Maintenance  Topic Date Due   COVID-19 Vaccine (3 - 2024-25 season) 09/18/2023   MAMMOGRAM  12/27/2023   INFLUENZA VACCINE  02/02/2024   Medicare Annual Wellness (AWV)  10/25/2024   DTaP/Tdap/Td (2 - Td or Tdap) 03/27/2029   Pneumonia Vaccine 38+ Years old  Completed   DEXA SCAN  Completed   Zoster Vaccines-  Shingrix  Completed   HPV VACCINES  Aged Out   Meningococcal B Vaccine  Aged Out    Health Maintenance  Health Maintenance Due  Topic Date Due   COVID-19 Vaccine (3 - 2024-25 season) 09/18/2023   Health Maintenance Items Addressed: Up to date  Additional Screening:  Vision Screening: Recommended annual ophthalmology exams for early detection of glaucoma and other disorders of the eye.  Dental Screening: Recommended annual dental exams for proper oral hygiene  Community Resource Referral / Chronic Care Management: CRR required this visit?  No   CCM required this visit?  No     Plan:     I have personally reviewed and noted the following in the patient's chart:   Medical and social history Use of alcohol, tobacco or illicit drugs  Current medications and supplements including opioid prescriptions. Patient is not currently taking opioid prescriptions. Functional ability and status Nutritional status Physical activity Advanced directives List of other physicians Hospitalizations, surgeries, and ER visits in previous 12 months Vitals Screenings to include cognitive, depression, and falls Referrals and appointments  In addition, I have reviewed and discussed with patient certain preventive protocols,  quality metrics, and best practice recommendations. A written personalized care plan for preventive services as well as general preventive health recommendations were provided to patient.     Areatha Beecham, LPN   [id number]/[id number]   After Visit Summary: (MyChart) Due to this being a telephonic visit, the after visit summary with patients personalized plan was offered to patient via MyChart   Notes: Nothing significant to report at this time.

## 2023-11-01 DIAGNOSIS — M48062 Spinal stenosis, lumbar region with neurogenic claudication: Secondary | ICD-10-CM | POA: Diagnosis not present

## 2023-11-02 HISTORY — PX: CARPAL TUNNEL RELEASE: SHX101

## 2023-11-06 DIAGNOSIS — M48062 Spinal stenosis, lumbar region with neurogenic claudication: Secondary | ICD-10-CM | POA: Diagnosis not present

## 2023-11-28 DIAGNOSIS — G5602 Carpal tunnel syndrome, left upper limb: Secondary | ICD-10-CM | POA: Diagnosis not present

## 2023-12-07 ENCOUNTER — Encounter: Payer: Medicare HMO | Admitting: Family Medicine

## 2023-12-07 ENCOUNTER — Telehealth: Payer: Self-pay | Admitting: Family Medicine

## 2023-12-07 NOTE — Telephone Encounter (Signed)
 Copied from CRM [id number]. Topic: Appointments - Appointment Scheduling >> Dec 07, 2023  7:57 AM Allyne Areola wrote: Patient/patient representative is calling to schedule an appointment. Refer to attachments for appointment information. Patient was calling to reschedule her annual physical; however, the system was not allowing me to schedule. May we please assist with scheduling.

## 2023-12-08 NOTE — Telephone Encounter (Signed)
 Spoke with patient. Pt asked CPE can be moved out to July. Appt made for 07/01

## 2023-12-11 ENCOUNTER — Other Ambulatory Visit: Payer: Self-pay | Admitting: Family Medicine

## 2023-12-11 DIAGNOSIS — E785 Hyperlipidemia, unspecified: Secondary | ICD-10-CM

## 2023-12-11 DIAGNOSIS — G4700 Insomnia, unspecified: Secondary | ICD-10-CM

## 2023-12-11 DIAGNOSIS — K219 Gastro-esophageal reflux disease without esophagitis: Secondary | ICD-10-CM

## 2023-12-11 NOTE — Telephone Encounter (Signed)
 Requesting: Ambien  5mg   Contract: 05/11/2018 UDS: 05/11/2018 Last Visit: 09/14/23 Next Visit: 01/02/24 Last Refill: 06/08/23 #90 and 1RF   Please Advise

## 2023-12-12 DIAGNOSIS — M25632 Stiffness of left wrist, not elsewhere classified: Secondary | ICD-10-CM | POA: Diagnosis not present

## 2023-12-19 DIAGNOSIS — H353223 Exudative age-related macular degeneration, left eye, with inactive scar: Secondary | ICD-10-CM | POA: Diagnosis not present

## 2023-12-19 DIAGNOSIS — H26493 Other secondary cataract, bilateral: Secondary | ICD-10-CM | POA: Diagnosis not present

## 2023-12-19 DIAGNOSIS — H43813 Vitreous degeneration, bilateral: Secondary | ICD-10-CM | POA: Diagnosis not present

## 2023-12-19 DIAGNOSIS — H353211 Exudative age-related macular degeneration, right eye, with active choroidal neovascularization: Secondary | ICD-10-CM | POA: Diagnosis not present

## 2024-01-02 ENCOUNTER — Ambulatory Visit (INDEPENDENT_AMBULATORY_CARE_PROVIDER_SITE_OTHER): Admitting: Family Medicine

## 2024-01-02 ENCOUNTER — Encounter: Payer: Self-pay | Admitting: Family Medicine

## 2024-01-02 ENCOUNTER — Ambulatory Visit (HOSPITAL_BASED_OUTPATIENT_CLINIC_OR_DEPARTMENT_OTHER)
Admission: RE | Admit: 2024-01-02 | Discharge: 2024-01-02 | Disposition: A | Discharge status: Home / Self Care | Source: Ambulatory Visit | Attending: Family Medicine | Admitting: Family Medicine

## 2024-01-02 VITALS — BP 140/70 | HR 64 | Temp 98.2°F | Resp 18 | Ht 64.0 in | Wt 138.4 lb

## 2024-01-02 DIAGNOSIS — G4700 Insomnia, unspecified: Secondary | ICD-10-CM

## 2024-01-02 DIAGNOSIS — Z0001 Encounter for general adult medical examination with abnormal findings: Secondary | ICD-10-CM

## 2024-01-02 DIAGNOSIS — M48061 Spinal stenosis, lumbar region without neurogenic claudication: Secondary | ICD-10-CM | POA: Diagnosis not present

## 2024-01-02 DIAGNOSIS — E785 Hyperlipidemia, unspecified: Secondary | ICD-10-CM | POA: Diagnosis not present

## 2024-01-02 DIAGNOSIS — M810 Age-related osteoporosis without current pathological fracture: Secondary | ICD-10-CM

## 2024-01-02 DIAGNOSIS — M79604 Pain in right leg: Secondary | ICD-10-CM | POA: Insufficient documentation

## 2024-01-02 DIAGNOSIS — M545 Low back pain, unspecified: Secondary | ICD-10-CM

## 2024-01-02 DIAGNOSIS — E039 Hypothyroidism, unspecified: Secondary | ICD-10-CM | POA: Diagnosis not present

## 2024-01-02 DIAGNOSIS — Z79899 Other long term (current) drug therapy: Secondary | ICD-10-CM | POA: Diagnosis not present

## 2024-01-02 DIAGNOSIS — B354 Tinea corporis: Secondary | ICD-10-CM

## 2024-01-02 DIAGNOSIS — Z0000 Encounter for general adult medical examination without abnormal findings: Secondary | ICD-10-CM

## 2024-01-02 DIAGNOSIS — M47816 Spondylosis without myelopathy or radiculopathy, lumbar region: Secondary | ICD-10-CM | POA: Diagnosis not present

## 2024-01-02 LAB — COMPREHENSIVE METABOLIC PANEL WITH GFR
ALT: 18 U/L (ref 0–35)
AST: 23 U/L (ref 0–37)
Albumin: 4.4 g/dL (ref 3.5–5.2)
Alkaline Phosphatase: 104 U/L (ref 39–117)
BUN: 19 mg/dL (ref 6–23)
CO2: 31 meq/L (ref 19–32)
Calcium: 9.4 mg/dL (ref 8.4–10.5)
Chloride: 100 meq/L (ref 96–112)
Creatinine, Ser: 0.68 mg/dL (ref 0.40–1.20)
GFR: 80.2 mL/min (ref >60.00)
Glucose, Bld: 96 mg/dL (ref 70–99)
Potassium: 4.3 meq/L (ref 3.5–5.1)
Sodium: 139 meq/L (ref 135–145)
Total Bilirubin: 0.6 mg/dL (ref 0.2–1.2)
Total Protein: 6.4 g/dL (ref 6.0–8.3)

## 2024-01-02 LAB — LIPID PANEL
Cholesterol: 174 mg/dL (ref 0–200)
HDL: 59.8 mg/dL (ref >39.00)
LDL Cholesterol: 91 mg/dL (ref 0–99)
NonHDL: 114.46
Total CHOL/HDL Ratio: 3
Triglycerides: 116 mg/dL (ref 0.0–149.0)
VLDL: 23.2 mg/dL (ref 0.0–40.0)

## 2024-01-02 LAB — CBC WITH DIFFERENTIAL/PLATELET
Basophils Absolute: 0 10*3/uL (ref 0.0–0.1)
Basophils Relative: 0.6 % (ref 0.0–3.0)
Eosinophils Absolute: 0.1 10*3/uL (ref 0.0–0.7)
Eosinophils Relative: 2.2 % (ref 0.0–5.0)
HCT: 43.1 % (ref 36.0–46.0)
Hemoglobin: 14.3 g/dL (ref 12.0–15.0)
Lymphocytes Relative: 27.2 % (ref 12.0–46.0)
Lymphs Abs: 1.7 10*3/uL (ref 0.7–4.0)
MCHC: 33.2 g/dL (ref 30.0–36.0)
MCV: 94.9 fl (ref 78.0–100.0)
Monocytes Absolute: 0.6 10*3/uL (ref 0.1–1.0)
Monocytes Relative: 9.2 % (ref 3.0–12.0)
Neutro Abs: 3.8 10*3/uL (ref 1.4–7.7)
Neutrophils Relative %: 60.8 % (ref 43.0–77.0)
Platelets: 230 10*3/uL (ref 150.0–400.0)
RBC: 4.54 Mil/uL (ref 3.87–5.11)
RDW: 13.6 % (ref 11.5–15.5)
WBC: 6.2 10*3/uL (ref 4.0–10.5)

## 2024-01-02 LAB — TSH: TSH: 4.87 u[IU]/mL (ref 0.35–5.50)

## 2024-01-02 MED ORDER — NYSTATIN 100000 UNIT/GM EX POWD
1.0000 | Freq: Three times a day (TID) | CUTANEOUS | 0 refills | Status: DC
Start: 2024-01-02 — End: 2024-02-07

## 2024-01-02 MED ORDER — PREDNISONE 10 MG PO TABS
ORAL_TABLET | ORAL | 0 refills | Status: DC
Start: 2024-01-02 — End: 2024-02-07

## 2024-01-02 NOTE — Patient Instructions (Signed)
 Preventive Care 43 Years and Older, Female Preventive care refers to lifestyle choices and visits with your health care provider that can promote health and wellness. Preventive care visits are also called wellness exams. What can I expect for my preventive care visit? Counseling Your health care provider may ask you questions about your: Medical history, including: Past medical problems. Family medical history. Pregnancy and menstrual history. History of falls. Current health, including: Memory and ability to understand (cognition). Emotional well-being. Home life and relationship well-being. Sexual activity and sexual health. Lifestyle, including: Alcohol, nicotine or tobacco, and drug use. Access to firearms. Diet, exercise, and sleep habits. Work and work Astronomer. Sunscreen use. Safety issues such as seatbelt and bike helmet use. Physical exam Your health care provider will check your: Height and weight. These may be used to calculate your BMI (body mass index). BMI is a measurement that tells if you are at a healthy weight. Waist circumference. This measures the distance around your waistline. This measurement also tells if you are at a healthy weight and may help predict your risk of certain diseases, such as type 2 diabetes and high blood pressure. Heart rate and blood pressure. Body temperature. Skin for abnormal spots. What immunizations do I need?  Vaccines are usually given at various ages, according to a schedule. Your health care provider will recommend vaccines for you based on your age, medical history, and lifestyle or other factors, such as travel or where you work. What tests do I need? Screening Your health care provider may recommend screening tests for certain conditions. This may include: Lipid and cholesterol levels. Hepatitis C test. Hepatitis B test. HIV (human immunodeficiency virus) test. STI (sexually transmitted infection) testing, if you are at  risk. Lung cancer screening. Colorectal cancer screening. Diabetes screening. This is done by checking your blood sugar (glucose) after you have not eaten for a while (fasting). Mammogram. Talk with your health care provider about how often you should have regular mammograms. BRCA-related cancer screening. This may be done if you have a family history of breast, ovarian, tubal, or peritoneal cancers. Bone density scan. This is done to screen for osteoporosis. Talk with your health care provider about your test results, treatment options, and if necessary, the need for more tests. Follow these instructions at home: Eating and drinking  Eat a diet that includes fresh fruits and vegetables, whole grains, lean protein, and low-fat dairy products. Limit your intake of foods with high amounts of sugar, saturated fats, and salt. Take vitamin and mineral supplements as recommended by your health care provider. Do not drink alcohol if your health care provider tells you not to drink. If you drink alcohol: Limit how much you have to 0-1 drink a day. Know how much alcohol is in your drink. In the [place] one drink equals one 12 oz bottle of beer (355 mL), one 5 oz glass of wine (148 mL), or one 1 oz glass of hard liquor (44 mL). Lifestyle Brush your teeth every morning and night with fluoride toothpaste. Floss one time each day. Exercise for at least 30 minutes 5 or more days each week. Do not use any products that contain nicotine or tobacco. These products include cigarettes, chewing tobacco, and vaping devices, such as e-cigarettes. If you need help quitting, ask your health care provider. Do not use drugs. If you are sexually active, practice safe sex. Use a condom or other form of protection in order to prevent STIs. Take aspirin only as told by  your health care provider. Make sure that you understand how much to take and what form to take. Work with your health care provider to find out whether it  is safe and beneficial for you to take aspirin daily. Ask your health care provider if you need to take a cholesterol-lowering medicine (statin). Find healthy ways to manage stress, such as: Meditation, yoga, or listening to music. Journaling. Talking to a trusted person. Spending time with friends and family. Minimize exposure to UV radiation to reduce your risk of skin cancer. Safety Always wear your seat belt while driving or riding in a vehicle. Do not drive: If you have been drinking alcohol. Do not ride with someone who has been drinking. When you are tired or distracted. While texting. If you have been using any mind-altering substances or drugs. Wear a helmet and other protective equipment during sports activities. If you have firearms in your house, make sure you follow all gun safety procedures. What's next? Visit your health care provider once a year for an annual wellness visit. Ask your health care provider how often you should have your eyes and teeth checked. Stay up to date on all vaccines. This information is not intended to replace advice given to you by your health care provider. Make sure you discuss any questions you have with your health care provider. Document Revised: 12/16/2020 Document Reviewed: 12/16/2020 Elsevier Patient Education  2024 [employer].

## 2024-01-02 NOTE — Progress Notes (Signed)
 Established Patient Office Visit  Subjective   Patient ID: Joanna Williams, female    DOB: 25-Jun-1941  Age: 83 y.o. MRN: [id number]  Chief Complaint  Patient presents with   Annual Exam    Pt states fasting     HPI Discussed the use of AI scribe software for clinical note transcription with the patient, who gave verbal consent to proceed.  History of Present Illness Joanna Williams is an 83 year old female who presents with acute right-sided back and leg pain.  She has been experiencing acute right-sided back and leg pain for the past two days. The pain is localized to the right side of her back and radiates down to her knee, but not to the foot. The pain is severe, causing her to cry, and she describes it as 'hurts so bad'. She has difficulty getting up and feels like her leg might give out when walking. The pain is exacerbated by sitting and certain movements, such as lifting her left leg, which she finds particularly painful.  She has been using oxycodone  for pain relief, taking one tablet which did not alleviate the pain, but two tablets taken four hours apart provided some relief. She is unsure about the frequency of oxycodone  use but has been hesitant to take it. She has also tried meloxicam , which previously helped with knee pain, but it did not relieve her current symptoms. She has been using a heating pad and ice pack for pain management, finding some relief with these methods.  She mentions a history of carpal tunnel surgery about seven weeks ago, which has limited her physical activities, including playing pickleball. She has not engaged in any strenuous activities recently due to her recovery from surgery.  She reports easy bruising with minimal contact, which she attributes to her use of meloxicam  and possibly Preservision, which contains omega-3. She uses a derma med moisturizer for bruising, which contains Arnica, but is unsure of its  effectiveness.  Additionally, she has a rash under both breasts, which she describes as causing significant discomfort. She has been using a white ointment previously provided, which has helped, but the rash sometimes causes significant discomfort.  No sudden spasms or pain that grabs suddenly. Reports weakness in the right leg and difficulty walking without a cane.   Patient Active Problem List   Diagnosis Date Noted   Left hip pain 09/26/2022   Insomnia 09/26/2022   Pre-op examination 09/26/2022   Preventative health care 07/18/2021   Age-related osteoporosis without current pathological fracture 06/04/2019   Murmur 06/04/2019   Tongue lesion 06/04/2019   Low back pain with radiation 06/04/2019   Right lower quadrant abdominal mass 01/24/2019   Carpal tunnel syndrome on both sides 11/06/2017   UTI (urinary tract infection) 05/12/2014   Sciatica 02/04/2014   GERD (gastroesophageal reflux disease) 08/13/2012   Hypothyroidism 06/15/2010   Benign neoplasm of skin 11/27/2009   Hyperlipidemia 02/13/2007   Past Medical History:  Diagnosis Date   GERD (gastroesophageal reflux disease)    Hyperlipidemia    Macular degeneration 05/04/2017   Past Surgical History:  Procedure Laterality Date   CARPAL TUNNEL RELEASE Left 11/2023   gramig   COLONOSCOPY  2 yrs ago   EYE SURGERY Bilateral 07/04/2012   cataract   TOTAL HIP ARTHROPLASTY  08/10/2011   Procedure: TOTAL HIP ARTHROPLASTY;  Surgeon: Dempsey JINNY Sensor, MD;  Location: [facility] OR;  Service: Orthopedics;  Laterality: Right;  DEPUY/ PENNACLE POLY OR CERAMIC  TOTAL HIP ARTHROPLASTY Left 11/08/2022   Procedure: TOTAL HIP ARTHROPLASTY;  Surgeon: Josefina Chew, MD;  Location: [facility];  Service: Orthopedics;  Laterality: Left;   Social History   Tobacco Use   Smoking status: Never   Smokeless tobacco: Never  Vaping Use   Vaping status: Never Used  Substance Use Topics   Alcohol use: Not Currently    Alcohol/week: 1.0 standard drink of  alcohol    Types: 1 Glasses of wine per week    Comment: rare   Drug use: Not Currently    Types: Hydrocodone    Social History   Socioeconomic History   Marital status: Married    Spouse name: Not on file   Number of children: Not on file   Years of education: Not on file   Highest education level: Not on file  Occupational History   Occupation: retired    Associate Professor: RETIRED  Tobacco Use   Smoking status: Never   Smokeless tobacco: Never  Vaping Use   Vaping status: Never Used  Substance and Sexual Activity   Alcohol use: Not Currently    Alcohol/week: 1.0 standard drink of alcohol    Types: 1 Glasses of wine per week    Comment: rare   Drug use: Not Currently    Types: Hydrocodone    Sexual activity: Yes    Partners: Male  Other Topics Concern   Not on file  Social History Narrative   Walking, yoga, pickle ball   Social Drivers of Health   Financial Resource Strain: Low Risk  (10/26/2023)   Overall Financial Resource Strain (CARDIA)    Difficulty of Paying Living Expenses: Not hard at all  Food Insecurity: No Food Insecurity (10/26/2023)   Hunger Vital Sign    Worried About Running Out of Food in the Last Year: Never true    Ran Out of Food in the Last Year: Never true  Transportation Needs: No Transportation Needs (10/26/2023)   PRAPARE - Administrator, Civil Service (Medical): No    Lack of Transportation (Non-Medical): No  Physical Activity: Insufficiently Active (10/26/2023)   Exercise Vital Sign    Days of Exercise per Week: 3 days    Minutes of Exercise per Session: 30 min  Stress: No Stress Concern Present (10/26/2023)   [employer] of Occupational Health - Occupational Stress Questionnaire    Feeling of Stress : Not at all  Social Connections: Moderately Integrated (10/26/2023)   Social Connection and Isolation Panel    Frequency of Communication with Friends and Family: More than three times a week    Frequency of Social Gatherings with  Friends and Family: Three times a week    Attends Religious Services: More than 4 times per year    Active Member of Clubs or Organizations: No    Attends Banker Meetings: Never    Marital Status: Married  Catering manager Violence: Not At Risk (10/26/2023)   Humiliation, Afraid, Rape, and Kick questionnaire    Fear of Current or Ex-Partner: No    Emotionally Abused: No    Physically Abused: No    Sexually Abused: No   Family Status  Relation Name Status   Mother  Deceased at age 75   Father  Deceased   Sister  Alive   Brother  Alive   Sister  Alive   Sister  Alive   Sister  Alive  No partnership data on file   Family History  Problem Relation Age of  Onset   Heart disease Mother    Cancer Father 44       brain   Allergies  Allergen Reactions   Niacin Nausea Only      Review of Systems  Constitutional:  Negative for fever and malaise/fatigue.  HENT:  Negative for congestion.   Eyes:  Negative for blurred vision.  Respiratory:  Negative for cough and shortness of breath.   Cardiovascular:  Negative for chest pain, palpitations and leg swelling.  Gastrointestinal:  Negative for abdominal pain, blood in stool, nausea and vomiting.  Genitourinary:  Negative for dysuria and frequency.  Musculoskeletal:  Positive for back pain. Negative for falls.  Skin:  Negative for rash.  Neurological:  Negative for dizziness, loss of consciousness and headaches.  Endo/Heme/Allergies:  Negative for environmental allergies.  Psychiatric/Behavioral:  Negative for depression. The patient is not nervous/anxious.       Objective:     BP (!) 140/70 (BP Location: Left Arm, Patient Position: Sitting, Cuff Size: Normal)   Pulse 64   Temp 98.2 F (36.8 C) (Oral)   Resp 18   Ht 5' 4 (1.626 m)   Wt 138 lb 6.4 oz (62.8 kg)   SpO2 94%   BMI 23.76 kg/m  BP Readings from Last 3 Encounters:  01/02/24 (!) 140/70  09/14/23 128/60  12/06/22 126/72   Wt Readings from Last 3  Encounters:  01/02/24 138 lb 6.4 oz (62.8 kg)  09/14/23 146 lb 12.8 oz (66.6 kg)  12/06/22 134 lb 12.8 oz (61.1 kg)   SpO2 Readings from Last 3 Encounters:  01/02/24 94%  09/14/23 94%  12/06/22 96%      Physical Exam Vitals and nursing note reviewed.  Constitutional:      General: She is not in acute distress.    Appearance: Normal appearance. She is well-developed.  HENT:     Head: Normocephalic and atraumatic.     Right Ear: Tympanic membrane, ear canal and external ear normal. There is no impacted cerumen.     Left Ear: Tympanic membrane, ear canal and external ear normal. There is no impacted cerumen.     Nose: Nose normal.     Mouth/Throat:     Mouth: Mucous membranes are moist.     Pharynx: Oropharynx is clear. No oropharyngeal exudate or posterior oropharyngeal erythema.   Eyes:     General: No scleral icterus.       Right eye: No discharge.        Left eye: No discharge.     Conjunctiva/sclera: Conjunctivae normal.     Pupils: Pupils are equal, round, and reactive to light.   Neck:     Thyroid : No thyromegaly or thyroid  tenderness.     Vascular: No JVD.   Cardiovascular:     Rate and Rhythm: Normal rate and regular rhythm.     Heart sounds: Normal heart sounds. No murmur heard. Pulmonary:     Effort: Pulmonary effort is normal. No respiratory distress.     Breath sounds: Normal breath sounds.  Abdominal:     General: Bowel sounds are normal. There is no distension.     Palpations: Abdomen is soft. There is no mass.     Tenderness: There is no abdominal tenderness. There is no guarding or rebound.  Genitourinary:    Vagina: Normal.   Musculoskeletal:        General: Tenderness present.     Cervical back: Normal range of motion and neck supple.  Lumbar back: Spasms and tenderness present. No signs of trauma or bony tenderness. Decreased range of motion. Positive right straight leg raise test and positive left straight leg raise test. No scoliosis.      Right lower leg: No edema.     Left lower leg: No edema.  Lymphadenopathy:     Cervical: No cervical adenopathy.   Skin:    General: Skin is warm and dry.     Findings: Erythema and rash present. Rash is macular.         Comments: Rash under breasts    Neurological:     Mental Status: She is alert and oriented to person, place, and time.     Cranial Nerves: No cranial nerve deficit.     Motor: Weakness present. No tremor, atrophy, abnormal muscle tone, seizure activity or pronator drift.     Gait: Gait abnormal.     Comments: 2/5 weakness R hip flex and lower leg ext 2/5 r toe dorsi/ plantar flexion 2/5 R plantar/ dorsi flexion foot   Psychiatric:        Mood and Affect: Mood normal.        Behavior: Behavior normal.        Thought Content: Thought content normal.        Judgment: Judgment normal.      No results found for any visits on 01/02/24.  Last CBC Lab Results  Component Value Date   WBC 7.4 12/06/2022   HGB 12.9 12/06/2022   HCT 39.8 12/06/2022   MCV 98.8 12/06/2022   MCH 33.5 10/27/2022   RDW 13.1 12/06/2022   PLT 269.0 12/06/2022   Last metabolic panel Lab Results  Component Value Date   GLUCOSE 88 12/06/2022   NA 138 12/06/2022   K 4.6 12/06/2022   CL 100 12/06/2022   CO2 30 12/06/2022   BUN 18 12/06/2022   CREATININE 0.67 12/06/2022   GFR 81.09 12/06/2022   CALCIUM  9.2 12/06/2022   PROT 6.2 12/06/2022   ALBUMIN 4.0 12/06/2022   BILITOT 0.5 12/06/2022   ALKPHOS 102 12/06/2022   AST 19 12/06/2022   ALT 10 12/06/2022   ANIONGAP 10 10/27/2022   Last lipids Lab Results  Component Value Date   CHOL 160 12/06/2022   HDL 52.30 12/06/2022   LDLCALC 76 12/06/2022   LDLDIRECT 84.0 12/14/2020   TRIG 158.0 (H) 12/06/2022   CHOLHDL 3 12/06/2022   Last hemoglobin A1c Lab Results  Component Value Date   HGBA1C 5.8 08/13/2012   Last thyroid  functions Lab Results  Component Value Date   TSH 3.45 12/06/2022   Last vitamin D  Lab Results   Component Value Date   VD25OH 23.76 (L) 08/26/2014   Last vitamin B12 and Folate No results found for: VITAMINB12, FOLATE    The ASCVD Risk score (Arnett DK, et al., 2019) failed to calculate for the following reasons:   The 2019 ASCVD risk score is only valid for ages 64 to 4    Assessment & Plan:   Problem List Items Addressed This Visit       Unprioritized   Age-related osteoporosis without current pathological fracture   Hypothyroidism   Relevant Orders   TSH   Hyperlipidemia   Relevant Orders   CBC with Differential/Platelet   Comprehensive metabolic panel with GFR   Lipid panel   Preventative health care - Primary   Ghm utd Check labs See AVS Health Maintenance  Topic Date Due   MAMMOGRAM  12/27/2023  COVID-19 Vaccine (3 - 2024-25 season) 01/18/2024 (Originally 09/18/2023)   INFLUENZA VACCINE  02/02/2024   Medicare Annual Wellness (AWV)  10/25/2024   DTaP/Tdap/Td (2 - Td or Tdap) 03/27/2029   Pneumococcal Vaccine: 50+ Years  Completed   DEXA SCAN  Completed   Zoster Vaccines- Shingrix  Completed   Hepatitis B Vaccines  Aged Out   HPV VACCINES  Aged Out   Meningococcal B Vaccine  Aged Out         Other Visit Diagnoses       Low back pain radiating to right leg       Relevant Medications   predniSONE (DELTASONE) 10 MG tablet   Other Relevant Orders   DG Lumbar Spine Complete     Tinea corporis       Relevant Medications   nystatin  powder     Assessment and Plan Assessment & Plan Right-sided low back pain with radiculopathy   She has acute right-sided low back pain radiating to the knee for two days, causing severe functional impairment and not fully relieved by oxycodone . There is associated right leg weakness. The differential diagnosis includes lumbar disc herniation, nerve impingement, or muscular strain. An x-ray is planned to assess for vertebral issues such as disc bulging or compression fracture. Prednisone is considered for potential  muscular inflammation. She understands that the x-ray shows only bone structures and may not reveal all issues. Prednisone may help if the pain is muscular, but not if nerve-related. Order a lumbar spine x-ray. Prescribe a prednisone pack. Continue oxycodone  as needed for pain management. Advise using ice for nerve pain and heat for muscle pain. Instruct her to call if the pain becomes unbearable and x-ray results are delayed.  Bruising   She experiences easy bruising with minimal trauma, likely due to meloxicam , Preservision (which may contain omega-3), and age-related skin thinning. She is using a topical moisturizer with Arnica, which may aid healing. Continue using the topical moisturizer with Arnica.  Intertrigo (yeast infection under breasts)   She has a rash under both breasts, likely a yeast infection, which is sometimes painful. She is using a previously prescribed white ointment with some relief. A medicated powder is recommended to reduce moisture and treat the infection. Prescribe medicated powder for the yeast infection.  General Health Maintenance   She is considering the continuation of annual mammograms, which are covered by Medicare. The decision is left to her discretion. Discuss mammogram scheduling with her.    Return in about 6 months (around 07/04/2024), or if symptoms worsen or fail to improve.    Leeann Bady R Lowne Chase, DO

## 2024-01-02 NOTE — Addendum Note (Signed)
 Addended by: ESTELLE GILLIS D on: 01/02/2024 02:20 PM   Modules accepted: Orders

## 2024-01-02 NOTE — Assessment & Plan Note (Signed)
 Ghm utd Check labs See AVS Health Maintenance  Topic Date Due   MAMMOGRAM  12/27/2023   COVID-19 Vaccine (3 - 2024-25 season) 01/18/2024 (Originally 09/18/2023)   INFLUENZA VACCINE  02/02/2024   Medicare Annual Wellness (AWV)  10/25/2024   DTaP/Tdap/Td (2 - Td or Tdap) 03/27/2029   Pneumococcal Vaccine: 50+ Years  Completed   DEXA SCAN  Completed   Zoster Vaccines- Shingrix  Completed   Hepatitis B Vaccines  Aged Out   HPV VACCINES  Aged Out   Meningococcal B Vaccine  Aged Out

## 2024-01-03 ENCOUNTER — Telehealth: Payer: Self-pay

## 2024-01-03 ENCOUNTER — Other Ambulatory Visit (HOSPITAL_COMMUNITY): Payer: Self-pay

## 2024-01-03 NOTE — Telephone Encounter (Signed)
 Pharmacy Patient Advocate Encounter   Received notification from CoverMyMeds that prior authorization for  Nystatin  100000 UNIT/GM powder is required/requested.   Insurance verification completed.   The patient is insured through [employer] .   Per test claim: PA required; PA submitted to above mentioned insurance via CoverMyMeds Key/confirmation #/EOC [id number] Status is pending

## 2024-01-05 LAB — DRUG MONITORING PANEL 376104, URINE
Amphetamines: NEGATIVE ng/mL (ref <500)
Barbiturates: NEGATIVE ng/mL (ref <300)
Benzodiazepines: NEGATIVE ng/mL (ref <100)
Cocaine Metabolite: NEGATIVE ng/mL (ref <150)
Codeine: NEGATIVE ng/mL (ref <50)
Desmethyltramadol: NEGATIVE ng/mL (ref <100)
Hydrocodone: NEGATIVE ng/mL (ref <50)
Hydromorphone: NEGATIVE ng/mL (ref <50)
Morphine: NEGATIVE ng/mL (ref <50)
Norhydrocodone: NEGATIVE ng/mL (ref <50)
Noroxycodone: 5807 ng/mL — ABNORMAL HIGH (ref <50)
Opiates: NEGATIVE ng/mL (ref <100)
Oxycodone: 1013 ng/mL — ABNORMAL HIGH (ref <50)
Oxycodone: POSITIVE ng/mL — AB (ref <100)
Oxymorphone: 99 ng/mL — ABNORMAL HIGH (ref <50)
Tramadol: NEGATIVE ng/mL (ref <100)

## 2024-01-05 LAB — DM TEMPLATE

## 2024-01-07 ENCOUNTER — Other Ambulatory Visit: Payer: Self-pay | Admitting: Family Medicine

## 2024-01-07 DIAGNOSIS — G8929 Other chronic pain: Secondary | ICD-10-CM

## 2024-01-07 DIAGNOSIS — M25552 Pain in left hip: Secondary | ICD-10-CM

## 2024-01-08 ENCOUNTER — Ambulatory Visit: Payer: Self-pay | Admitting: Family Medicine

## 2024-01-08 ENCOUNTER — Ambulatory Visit: Payer: Self-pay

## 2024-01-08 ENCOUNTER — Encounter: Payer: Self-pay | Admitting: Family Medicine

## 2024-01-08 ENCOUNTER — Ambulatory Visit (INDEPENDENT_AMBULATORY_CARE_PROVIDER_SITE_OTHER): Admitting: Family Medicine

## 2024-01-08 ENCOUNTER — Other Ambulatory Visit (HOSPITAL_COMMUNITY): Payer: Self-pay

## 2024-01-08 VITALS — BP 138/68 | HR 82 | Temp 98.1°F | Resp 18 | Ht 64.0 in | Wt 138.8 lb

## 2024-01-08 DIAGNOSIS — M48062 Spinal stenosis, lumbar region with neurogenic claudication: Secondary | ICD-10-CM | POA: Diagnosis not present

## 2024-01-08 MED ORDER — OXYCODONE HCL 5 MG PO TABS
5.0000 mg | ORAL_TABLET | ORAL | 0 refills | Status: DC | PRN
Start: 2024-01-08 — End: 2024-02-07

## 2024-01-08 NOTE — Progress Notes (Signed)
 Established Patient Office Visit  Subjective   Patient ID: Joanna Williams, female    DOB: December 28, 1940  Age: 83 y.o. MRN: [id number]  Chief Complaint  Patient presents with   Back Pain   Follow-up    HPI Discussed the use of AI scribe software for clinical note transcription with the patient, who gave verbal consent to proceed.  History of Present Illness Joanna Williams is an 83 year old female with degenerative changes and spinal stenosis who presents with persistent back pain radiating to the right leg.  She has been experiencing persistent back pain radiating down her right leg for the past two weeks, with no improvement since her last visit. The pain is severe enough to significantly impact her mobility and daily activities.  Her medical history includes degenerative changes and spinal stenosis, confirmed by recent x-rays. She has previously received pain management treatment, including injections, but these were not specifically for her current symptoms.  She is currently taking meloxicam , which is not providing relief, and has also used oxycodone  for pain management. No muscle spasms or trouble sleeping are reported, although moving around is tiring and exacerbates her pain.  There has been no worsening of symptoms, but also no improvement. She has not had an MRI before and does not experience claustrophobia.   Patient Active Problem List   Diagnosis Date Noted   Left hip pain 09/26/2022   Insomnia 09/26/2022   Pre-op examination 09/26/2022   Preventative health care 07/18/2021   Age-related osteoporosis without current pathological fracture 06/04/2019   Murmur 06/04/2019   Tongue lesion 06/04/2019   Low back pain with radiation 06/04/2019   Right lower quadrant abdominal mass 01/24/2019   Carpal tunnel syndrome on both sides 11/06/2017   UTI (urinary tract infection) 05/12/2014    Sciatica 02/04/2014   GERD (gastroesophageal reflux disease) 08/13/2012   Hypothyroidism 06/15/2010   Benign neoplasm of skin 11/27/2009   Hyperlipidemia 02/13/2007   Past Medical History:  Diagnosis Date   GERD (gastroesophageal reflux disease)    Hyperlipidemia    Macular degeneration 05/04/2017   Past Surgical History:  Procedure Laterality Date   CARPAL TUNNEL RELEASE Left 11/2023   gramig   COLONOSCOPY  2 yrs ago   EYE SURGERY Bilateral 07/04/2012   cataract   TOTAL HIP ARTHROPLASTY  08/10/2011   Procedure: TOTAL HIP ARTHROPLASTY;  Surgeon: Dempsey JINNY Sensor, MD;  Location: [facility] OR;  Service: Orthopedics;  Laterality: Right;  DEPUY/ PENNACLE POLY OR CERAMIC   TOTAL HIP ARTHROPLASTY Left 11/08/2022   Procedure: TOTAL HIP ARTHROPLASTY;  Surgeon: Josefina Chew, MD;  Location: [facility];  Service: Orthopedics;  Laterality: Left;   Social History   Tobacco Use   Smoking status: Never   Smokeless tobacco: Never  Vaping Use   Vaping status: Never Used  Substance Use Topics   Alcohol use: Not Currently    Alcohol/week: 1.0 standard drink of alcohol    Types: 1 Glasses of wine per week    Comment: rare   Drug use: Not Currently    Types: Hydrocodone    Social History   Socioeconomic History   Marital status: Married    Spouse name: Not on file   Number of children: Not on file  Years of education: Not on file   Highest education level: Not on file  Occupational History   Occupation: retired    Associate Professor: RETIRED  Tobacco Use   Smoking status: Never   Smokeless tobacco: Never  Vaping Use   Vaping status: Never Used  Substance and Sexual Activity   Alcohol use: Not Currently    Alcohol/week: 1.0 standard drink of alcohol    Types: 1 Glasses of wine per week    Comment: rare   Drug use: Not Currently    Types: Hydrocodone    Sexual activity: Yes    Partners: Male  Other Topics Concern   Not on file  Social History Narrative   Walking, yoga, pickle ball   Social  Drivers of Health   Financial Resource Strain: Low Risk  (10/26/2023)   Overall Financial Resource Strain (CARDIA)    Difficulty of Paying Living Expenses: Not hard at all  Food Insecurity: No Food Insecurity (10/26/2023)   Hunger Vital Sign    Worried About Running Out of Food in the Last Year: Never true    Ran Out of Food in the Last Year: Never true  Transportation Needs: No Transportation Needs (10/26/2023)   PRAPARE - Administrator, Civil Service (Medical): No    Lack of Transportation (Non-Medical): No  Physical Activity: Insufficiently Active (10/26/2023)   Exercise Vital Sign    Days of Exercise per Week: 3 days    Minutes of Exercise per Session: 30 min  Stress: No Stress Concern Present (10/26/2023)   [employer] of Occupational Health - Occupational Stress Questionnaire    Feeling of Stress : Not at all  Social Connections: Moderately Integrated (10/26/2023)   Social Connection and Isolation Panel    Frequency of Communication with Friends and Family: More than three times a week    Frequency of Social Gatherings with Friends and Family: Three times a week    Attends Religious Services: More than 4 times per year    Active Member of Clubs or Organizations: No    Attends Banker Meetings: Never    Marital Status: Married  Catering manager Violence: Not At Risk (10/26/2023)   Humiliation, Afraid, Rape, and Kick questionnaire    Fear of Current or Ex-Partner: No    Emotionally Abused: No    Physically Abused: No    Sexually Abused: No   Family Status  Relation Name Status   Mother  Deceased at age 40   Father  Deceased   Sister  Alive   Brother  Alive   Sister  Alive   Sister  Alive   Sister  Alive  No partnership data on file   Family History  Problem Relation Age of Onset   Heart disease Mother    Cancer Father 44       brain   Allergies  Allergen Reactions   Niacin Nausea Only      Review of Systems  Constitutional:   Negative for fever and malaise/fatigue.  HENT:  Negative for congestion.   Eyes:  Negative for blurred vision.  Respiratory:  Negative for cough and shortness of breath.   Cardiovascular:  Negative for chest pain, palpitations and leg swelling.  Gastrointestinal:  Negative for vomiting.  Musculoskeletal:  Positive for back pain.  Skin:  Negative for rash.  Neurological:  Positive for tingling and weakness. Negative for loss of consciousness and headaches.      Objective:     BP 138/68 (BP  Location: Left Arm, Patient Position: Sitting, Cuff Size: Normal)   Pulse 82   Temp 98.1 F (36.7 C) (Oral)   Resp 18   Ht 5' 4 (1.626 m)   Wt 138 lb 12.8 oz (63 kg)   BMI 23.82 kg/m  BP Readings from Last 3 Encounters:  01/08/24 138/68  01/02/24 (!) 140/70  09/14/23 128/60   Wt Readings from Last 3 Encounters:  01/08/24 138 lb 12.8 oz (63 kg)  01/02/24 138 lb 6.4 oz (62.8 kg)  09/14/23 146 lb 12.8 oz (66.6 kg)   SpO2 Readings from Last 3 Encounters:  01/02/24 94%  09/14/23 94%  12/06/22 96%      Physical Exam Vitals and nursing note reviewed.  Musculoskeletal:        General: Tenderness present.     Lumbar back: Tenderness and bony tenderness present. Decreased range of motion. Positive right straight leg raise test and positive left straight leg raise test.      No results found for any visits on 01/08/24.  Last CBC Lab Results  Component Value Date   WBC 6.2 01/02/2024   HGB 14.3 01/02/2024   HCT 43.1 01/02/2024   MCV 94.9 01/02/2024   MCH 33.5 10/27/2022   RDW 13.6 01/02/2024   PLT 230.0 01/02/2024   Last metabolic panel Lab Results  Component Value Date   GLUCOSE 96 01/02/2024   NA 139 01/02/2024   K 4.3 01/02/2024   CL 100 01/02/2024   CO2 31 01/02/2024   BUN 19 01/02/2024   CREATININE 0.68 01/02/2024   GFR 80.20 01/02/2024   CALCIUM  9.4 01/02/2024   PROT 6.4 01/02/2024   ALBUMIN 4.4 01/02/2024   BILITOT 0.6 01/02/2024   ALKPHOS 104 01/02/2024    AST 23 01/02/2024   ALT 18 01/02/2024   ANIONGAP 10 10/27/2022   Last lipids Lab Results  Component Value Date   CHOL 174 01/02/2024   HDL 59.80 01/02/2024   LDLCALC 91 01/02/2024   LDLDIRECT 84.0 12/14/2020   TRIG 116.0 01/02/2024   CHOLHDL 3 01/02/2024   Last hemoglobin A1c Lab Results  Component Value Date   HGBA1C 5.8 08/13/2012   Last thyroid  functions Lab Results  Component Value Date   TSH 4.87 01/02/2024   Last vitamin D  Lab Results  Component Value Date   VD25OH 23.76 (L) 08/26/2014   Last vitamin B12 and Folate No results found for: VITAMINB12, FOLATE    The ASCVD Risk score (Arnett DK, et al., 2019) failed to calculate for the following reasons:   The 2019 ASCVD risk score is only valid for ages 85 to 31    Assessment & Plan:   Problem List Items Addressed This Visit   None Visit Diagnoses       Spinal stenosis of lumbar region with neurogenic claudication    -  Primary   Relevant Medications   oxyCODONE  (ROXICODONE ) 5 MG immediate release tablet   Other Relevant Orders   MR Lumbar Spine Wo Contrast   Ambulatory referral to Neurosurgery     Assessment and Plan Assessment & Plan Lumbar Spinal Stenosis   Lumbar spinal stenosis with degenerative changes and spinal canal narrowing is causing spinal cord compression. Significant pain radiates down her right leg, persisting for two weeks, worsened by movement and relieved by rest. Current meloxicam  treatment is ineffective, requiring oxycodone  for pain relief. The condition affects mobility and daily activities. An MRI is needed to assess stenosis extent and guide further treatment options, such as injections  or surgery, though she prefers to avoid surgery. Order an MRI of the lumbar spine, prescribe oxycodone  for pain management, and refer to [facility] for evaluation. Advise alternating ice and heat for relief and instruct her to rest and avoid strenuous activities.  Degenerative Disc  Disease   X-ray shows degenerative changes in the lumbar spine, consistent with arthritis, contributing to spinal stenosis and symptoms. Chronic degenerative changes were previously managed with meloxicam , now ineffective. An MRI will determine the need for additional interventions. Evaluate MRI results to decide on further interventions such as injections or surgery.  Follow-up   A follow-up is required to assess MRI results and current pain management effectiveness. Further specialist evaluation is needed to determine appropriate intervention for the spinal condition. Schedule a follow-up appointment after MRI results and coordinate with [facility] for specialist evaluation.    No follow-ups on file.    Juliona Vales R Lowne Chase, DO

## 2024-01-08 NOTE — Telephone Encounter (Signed)
 Pharmacy Patient Advocate Encounter  Received notification from [employer] that Prior Authorization for Nystatin  100000 UNIT/GM powder has been APPROVED from 07/05/2023 to 07/03/2024. Ran test claim, Copay is $5.25. This test claim was processed through [facility]- copay amounts may vary at other pharmacies due to pharmacy/plan contracts, or as the patient moves through the different stages of their insurance plan.   PA #/Case ID/Reference #: [id number]

## 2024-01-08 NOTE — Telephone Encounter (Signed)
 FYI Only or Action Required?: FYI only for provider.  Patient was last seen in primary care on 01/02/2024 by Antonio Meth, Jamee SAUNDERS, DO. Called Nurse Triage reporting Pain. Symptoms began a week ago. Interventions attempted: Prescription medications: prednisone . Symptoms are: gradually worsening.  Triage Disposition: See PCP When Office is Open (Within 3 Days)  Patient/caregiver understands and will follow disposition?: Yes    Copied from CRM [id number]. Topic: Clinical - Red Word Triage >> Jan 08, 2024  7:45 AM Robinson H wrote: [facility] that prompted transfer to Nurse Triage: Sciatica pain from butt cheek down into leg Reason for Disposition  [1] MODERATE pain (e.g., interferes with normal activities, limping) AND [2] present > 3 days  Answer Assessment - Initial Assessment Questions 1. ONSET: When did the pain start?      X several weeks and worsening x 1 wk 2. LOCATION: Where is the pain located?      Right buttocks going right leg 3. PAIN: How bad is the pain?    (Scale 1-10; or mild, moderate, severe)   -  MILD (1-3): doesn't interfere with normal activities    -  MODERATE (4-7): interferes with normal activities (e.g., work or school) or awakens from sleep, limping    -  SEVERE (8-10): excruciating pain, unable to do any normal activities, unable to walk     8/10 4. WORK OR EXERCISE: Has there been any recent work or exercise that involved this part of the body?      no 5. CAUSE: What do you think is causing the leg pain?     Sciatic nerve 6. OTHER SYMPTOMS: Do you have any other symptoms? (e.g., chest pain, back pain, breathing difficulty, swelling, rash, fever, numbness, weakness)      no 7. PREGNANCY: Is there any chance you are pregnant? When was your last n/a  Sitting 8/10 and walking 10/10 pain: pt having to use a cane to help ambulate.  Has been on prednisone  before.  Protocols used: Leg Pain-A-AH

## 2024-01-10 ENCOUNTER — Inpatient Hospital Stay
Admission: RE | Admit: 2024-01-10 | Discharge: 2024-01-10 | Discharge status: Home / Self Care | Source: Ambulatory Visit | Attending: Family Medicine | Admitting: Family Medicine

## 2024-01-10 DIAGNOSIS — M47816 Spondylosis without myelopathy or radiculopathy, lumbar region: Secondary | ICD-10-CM | POA: Diagnosis not present

## 2024-01-10 DIAGNOSIS — M5126 Other intervertebral disc displacement, lumbar region: Secondary | ICD-10-CM | POA: Diagnosis not present

## 2024-01-10 DIAGNOSIS — M48062 Spinal stenosis, lumbar region with neurogenic claudication: Secondary | ICD-10-CM

## 2024-01-10 DIAGNOSIS — M48061 Spinal stenosis, lumbar region without neurogenic claudication: Secondary | ICD-10-CM | POA: Diagnosis not present

## 2024-01-11 ENCOUNTER — Ambulatory Visit: Payer: Self-pay | Admitting: Family Medicine

## 2024-01-23 ENCOUNTER — Other Ambulatory Visit: Payer: Self-pay | Admitting: Family Medicine

## 2024-01-23 DIAGNOSIS — E785 Hyperlipidemia, unspecified: Secondary | ICD-10-CM

## 2024-01-24 ENCOUNTER — Telehealth: Payer: Self-pay | Admitting: Family Medicine

## 2024-01-24 ENCOUNTER — Ambulatory Visit: Payer: Self-pay

## 2024-01-24 NOTE — Telephone Encounter (Signed)
 Attempted to call patient. Unable to leave VM. The message was just the triage notes from the call

## 2024-01-24 NOTE — Telephone Encounter (Signed)
 Copied from CRM [id number]. Topic: Clinical - Medical Advice >> Jan 24, 2024 11:10 AM Chiquita SQUIBB wrote: Reason for CRM: Patient is calling in because she received a mychart message that stated to see her HCP within 4 hours. Patient would like more clarity on what this message in [place] means. I am not currently showing this message on my end for the patient. Please advise the patient if the office sent this.

## 2024-01-24 NOTE — Telephone Encounter (Signed)
 FYI Only or Action Required?: Action required by provider: pain medication recommendation.  Patient was last seen in primary care on 01/08/2024 by Antonio Meth, Jamee SAUNDERS, DO.  Called Nurse Triage reporting Back Pain.  Symptoms began several weeks ago.  Interventions attempted: OTC medications: Aleve and Rest, hydration, or home remedies.  Symptoms are: unchanged.  Triage Disposition: See HCP Within 4 Hours (Or PCP Triage)-requesting a phone call back from office in regards to pain medication.   Patient/caregiver understands and will follow disposition?: No, wishes to speak with PCP  Copied from CRM #[id number]. Topic: Clinical - Red Word Triage >> Jan 24, 2024 10:42 AM Rosina BIRCH wrote: Red Word that prompted transfer to Nurse Triage: patient is having terrible back pain and she is out of her medication-oxycodone  Reason for Disposition  [1] SEVERE back pain (e.g., excruciating, unable to do any normal activities) AND [2] not improved 2 hours after pain medicine  Answer Assessment - Initial Assessment Questions 1. ONSET: When did the pain begin? (e.g., minutes, hours, days)    Pain increased over two weeks prior to 7/7 appointment  2. LOCATION: Where does it hurt? (upper, mid or lower back)     Lower back pain 3. SEVERITY: How bad is the pain?  (e.g., Scale 1-10; mild, moderate, or severe)     9 out of 10 4. PATTERN: Is the pain constant? (e.g., yes, no; constant, intermittent)      constant 5. RADIATION: Does the pain shoot into your legs or somewhere else?     Shooting into legs 6. CAUSE:  What do you think is causing the back pain?      unsure 7. BACK OVERUSE:  Any recent lifting of heavy objects, strenuous work or exercise?     no 8. MEDICINES: What have you taken so far for the pain? (e.g., nothing, acetaminophen , NSAIDS)     Aleve 9. NEUROLOGIC SYMPTOMS: Do you have any weakness, numbness, or problems with bowel/bladder control?     no 10. OTHER SYMPTOMS: Do  you have any other symptoms? (e.g., fever, abdomen pain, burning with urination, blood in urine)       No other symptoms.  Patient was seen 01/08/2024 in PCP office-patient is out of oxycodone  prescription. Patient is due to see Neurosurgery tomorrow. Discussed with patient being seen again in PCP office but patient refused due to her appointment tomorrow with Neurosurgery. Patient is asking for recommendations from PCP in regard to pain control at this time. Patient reports she has been using Aleve but this isn't helping. Patient is asking for a phone call back.  Protocols used: Back Pain-A-AH

## 2024-01-24 NOTE — Telephone Encounter (Signed)
**Note De-identified  [place] Obfuscation** Please advise 

## 2024-01-25 DIAGNOSIS — M48062 Spinal stenosis, lumbar region with neurogenic claudication: Secondary | ICD-10-CM | POA: Diagnosis not present

## 2024-01-29 ENCOUNTER — Other Ambulatory Visit: Payer: Self-pay | Admitting: Neurosurgery

## 2024-02-09 NOTE — Pre-Procedure Instructions (Signed)
 Surgical Instructions   Your procedure is scheduled on February 19, 2024. Report to [facility] Main Entrance A at 10:50 A.M., then check in with the Admitting office. Any questions or running late day of surgery: call [phone]  Questions prior to your surgery date: call [phone], Monday-Friday, 8am-4pm. If you experience any cold or flu symptoms such as cough, fever, chills, shortness of breath, etc. between now and your scheduled surgery, please notify us  at the above number.     Remember:  Do not eat after midnight the night before your surgery  You may drink clear liquids until 9:50 AM the morning of your surgery.   Clear liquids allowed are: Water , Non-Citrus Juices (without pulp), Carbonated Beverages, Clear Tea (no milk, honey, etc.), Black Coffee Only (NO MILK, CREAM OR POWDERED CREAMER of any kind), and Gatorade.    Take these medicines the morning of surgery with A SIP OF WATER : gabapentin  (NEURONTIN )    May take these medicines IF NEEDED: traMADol (ULTRAM)    One week prior to surgery, STOP taking any Aspirin  (unless otherwise instructed by your surgeon) Aleve, Naproxen, Ibuprofen, Motrin, Advil, Goody's, BC's, all herbal medications, fish oil , and non-prescription vitamins. This includes your medication: meloxicam  (MOBIC )                      Do NOT Smoke (Tobacco/Vaping) for 24 hours prior to your procedure.  If you use a CPAP at night, you may bring your mask/headgear for your overnight stay.   You will be asked to remove any contacts, glasses, piercing's, hearing aid's, dentures/partials prior to surgery. Please bring cases for these items if needed.    Patients discharged the day of surgery will not be allowed to drive home, and someone needs to stay with them for 24 hours.  SURGICAL WAITING ROOM VISITATION Patients may have no more than 2 support people in the waiting area - these visitors may rotate.   Pre-op nurse will coordinate an appropriate time  for 1 ADULT support person, who may not rotate, to accompany patient in pre-op.  Children under the age of 68 must have an adult with them who is not the patient and must remain in the main waiting area with an adult.  If the patient needs to stay at the hospital during part of their recovery, the visitor guidelines for inpatient rooms apply.  Please refer to the [facility] website for the visitor guidelines for any additional information.   If you received a COVID test during your pre-op visit  it is requested that you wear a mask when out in public, stay away from anyone that may not be feeling well and notify your surgeon if you develop symptoms. If you have been in contact with anyone that has tested positive in the last 10 days please notify you surgeon.      Pre-operative 5 CHG Bathing Instructions   You can play a key role in reducing the risk of infection after surgery. Your skin needs to be as free of germs as possible. You can reduce the number of germs on your skin by washing with CHG (chlorhexidine  gluconate) soap before surgery. CHG is an antiseptic soap that kills germs and continues to kill germs even after washing.   DO NOT use if you have an allergy to chlorhexidine /CHG or antibacterial soaps. If your skin becomes reddened or irritated, stop using the CHG and notify one of our RNs at [phone].   Please shower with  the CHG soap starting 4 days before surgery using the following schedule:     Please keep in mind the following:  DO NOT shave, including legs and underarms, starting the day of your first shower.   You may shave your face at any point before/day of surgery.  Place clean sheets on your bed the day you start using CHG soap. Use a clean washcloth (not used since being washed) for each shower. DO NOT sleep with pets once you start using the CHG.   CHG Shower Instructions:  Wash your face and private area with normal soap. If you choose to wash your hair,  wash first with your normal shampoo.  After you use shampoo/soap, rinse your hair and body thoroughly to remove shampoo/soap residue.  Turn the water  OFF and apply about 3 tablespoons (45 ml) of CHG soap to a CLEAN washcloth.  Apply CHG soap ONLY FROM YOUR NECK DOWN TO YOUR TOES (washing for 3-5 minutes)  DO NOT use CHG soap on face, private areas, open wounds, or sores.  Pay special attention to the area where your surgery is being performed.  If you are having back surgery, having someone wash your back for you may be helpful. Wait 2 minutes after CHG soap is applied, then you may rinse off the CHG soap.  Pat dry with a clean towel  Put on clean clothes/pajamas   If you choose to wear lotion, please use ONLY the CHG-compatible lotions that are listed below.  Additional instructions for the day of surgery: DO NOT APPLY any lotions, deodorants, cologne, or perfumes.   Do not bring valuables to the hospital. [facility] is not responsible for any belongings/valuables. Do not wear nail polish, gel polish, artificial nails, or any other type of covering on natural nails (fingers and toes) Do not wear jewelry or makeup Put on clean/comfortable clothes.  Please brush your teeth.  Ask your nurse before applying any prescription medications to the skin.     CHG Compatible Lotions   Aveeno Moisturizing lotion  Cetaphil Moisturizing Cream  Cetaphil Moisturizing Lotion  Clairol Herbal Essence Moisturizing Lotion, Dry Skin  Clairol Herbal Essence Moisturizing Lotion, Extra Dry Skin  Clairol Herbal Essence Moisturizing Lotion, Normal Skin  Curel Age Defying Therapeutic Moisturizing Lotion with Alpha Hydroxy  Curel Extreme Care Body Lotion  Curel Soothing Hands Moisturizing Hand Lotion  Curel Therapeutic Moisturizing Cream, Fragrance-Free  Curel Therapeutic Moisturizing Lotion, Fragrance-Free  Curel Therapeutic Moisturizing Lotion, Original Formula  Eucerin Daily Replenishing Lotion   Eucerin Dry Skin Therapy Plus Alpha Hydroxy Crme  Eucerin Dry Skin Therapy Plus Alpha Hydroxy Lotion  Eucerin Original Crme  Eucerin Original Lotion  Eucerin Plus Crme Eucerin Plus Lotion  Eucerin TriLipid Replenishing Lotion  Keri Anti-Bacterial Hand Lotion  Keri Deep Conditioning Original Lotion Dry Skin Formula Softly Scented  Keri Deep Conditioning Original Lotion, Fragrance Free Sensitive Skin Formula  Keri Lotion Fast Absorbing Fragrance Free Sensitive Skin Formula  Keri Lotion Fast Absorbing Softly Scented Dry Skin Formula  Keri Original Lotion  Keri Skin Renewal Lotion Keri Silky Smooth Lotion  Keri Silky Smooth Sensitive Skin Lotion  Nivea Body Creamy Conditioning Oil  Nivea Body Extra Enriched Teacher, adult education Moisturizing Lotion Nivea Crme  Nivea Skin Firming Lotion  NutraDerm 30 Skin Lotion  NutraDerm Skin Lotion  NutraDerm Therapeutic Skin Cream  NutraDerm Therapeutic Skin Lotion  ProShield Protective Hand Cream  Provon moisturizing lotion  Please read over the following  fact sheets that you were given.

## 2024-02-12 ENCOUNTER — Other Ambulatory Visit: Payer: Self-pay

## 2024-02-12 ENCOUNTER — Encounter (HOSPITAL_COMMUNITY)
Admission: RE | Admit: 2024-02-12 | Discharge: 2024-02-12 | Disposition: A | Discharge status: Home / Self Care | Source: Ambulatory Visit | Attending: Neurosurgery | Admitting: Neurosurgery

## 2024-02-12 ENCOUNTER — Encounter (HOSPITAL_COMMUNITY): Payer: Self-pay

## 2024-02-12 VITALS — BP 97/60 | HR 68 | Temp 98.3°F | Resp 17 | Ht 64.0 in | Wt 137.2 lb

## 2024-02-12 DIAGNOSIS — I2510 Atherosclerotic heart disease of native coronary artery without angina pectoris: Secondary | ICD-10-CM | POA: Insufficient documentation

## 2024-02-12 DIAGNOSIS — Z01818 Encounter for other preprocedural examination: Secondary | ICD-10-CM | POA: Diagnosis not present

## 2024-02-12 HISTORY — DX: Cardiac murmur, unspecified: R01.1

## 2024-02-12 LAB — SURGICAL PCR SCREEN
MRSA, PCR: NEGATIVE
Staphylococcus aureus: NEGATIVE

## 2024-02-12 LAB — CBC
HCT: 44.7 % (ref 36.0–46.0)
Hemoglobin: 14.1 g/dL (ref 12.0–15.0)
MCH: 31.5 pg (ref 26.0–34.0)
MCHC: 31.5 g/dL (ref 30.0–36.0)
MCV: 100 fL (ref 80.0–100.0)
Platelets: 230 K/uL (ref 150–400)
RBC: 4.47 MIL/uL (ref 3.87–5.11)
RDW: 13.2 % (ref 11.5–15.5)
WBC: 8.3 K/uL (ref 4.0–10.5)
nRBC: 0 % (ref 0.0–0.2)

## 2024-02-12 LAB — BASIC METABOLIC PANEL WITH GFR
Anion gap: 8 (ref 5–15)
BUN: 12 mg/dL (ref 8–23)
CO2: 26 mmol/L (ref 22–32)
Calcium: 9.1 mg/dL (ref 8.9–10.3)
Chloride: 103 mmol/L (ref 98–111)
Creatinine, Ser: 0.7 mg/dL (ref 0.44–1.00)
GFR, Estimated: >60 mL/min (ref >60)
Glucose, Bld: 94 mg/dL (ref 70–99)
Potassium: 4.2 mmol/L (ref 3.5–5.1)
Sodium: 137 mmol/L (ref 135–145)

## 2024-02-12 NOTE — Progress Notes (Signed)
 PCP - Dr. Jamee Antonio Meth Cardiologist - Denies  PPM/ICD - Denies Device Orders - n/a Rep Notified - n/a  Chest x-ray - n/a EKG - 02/12/2024 Stress Test - Denies ECHO - 07/08/2019 Cardiac Cath - Denies  Sleep Study - Denies CPAP - n/a  No DM  Last dose of GLP1 agonist- n/a GLP1 instructions: n/a  Blood Thinner Instructions: n/a Aspirin  Instructions: n/a  ERAS Protcol - Clear liquids until 0950 morning of surgery PRE-SURGERY Ensure or G2- n/a  COVID TEST- n/a   Anesthesia review: No. Pt is able to walk up two flights of stairs without any cardiopulmonary symptoms and is only limited to current back issues.  Patient denies shortness of breath, fever, cough and chest pain at PAT appointment. Pt denies any respiratory illness/infection in the last two months.   All instructions explained to the patient, with a verbal understanding of the material. Patient agrees to go over the instructions while at home for a better understanding. Patient also instructed to self quarantine after being tested for COVID-19. The opportunity to ask questions was provided.

## 2024-02-19 ENCOUNTER — Ambulatory Visit (HOSPITAL_COMMUNITY)

## 2024-02-19 ENCOUNTER — Other Ambulatory Visit: Payer: Self-pay

## 2024-02-19 ENCOUNTER — Ambulatory Visit (HOSPITAL_COMMUNITY): Admitting: Certified Registered Nurse Anesthetist

## 2024-02-19 ENCOUNTER — Ambulatory Visit (HOSPITAL_COMMUNITY)
Admission: RE | Admit: 2024-02-19 | Discharge: 2024-02-20 | Disposition: A | Discharge status: Home / Self Care | Attending: Neurosurgery | Admitting: Neurosurgery

## 2024-02-19 ENCOUNTER — Ambulatory Visit (HOSPITAL_COMMUNITY)
Admission: RE | Disposition: A | Discharge status: Home / Self Care | Payer: Self-pay | Source: Home / Self Care | Attending: Neurosurgery

## 2024-02-19 ENCOUNTER — Ambulatory Visit (HOSPITAL_BASED_OUTPATIENT_CLINIC_OR_DEPARTMENT_OTHER): Admitting: Certified Registered Nurse Anesthetist

## 2024-02-19 ENCOUNTER — Encounter (HOSPITAL_COMMUNITY): Payer: Self-pay | Admitting: Neurosurgery

## 2024-02-19 DIAGNOSIS — M48061 Spinal stenosis, lumbar region without neurogenic claudication: Secondary | ICD-10-CM | POA: Diagnosis present

## 2024-02-19 DIAGNOSIS — E785 Hyperlipidemia, unspecified: Secondary | ICD-10-CM | POA: Diagnosis not present

## 2024-02-19 DIAGNOSIS — M5416 Radiculopathy, lumbar region: Secondary | ICD-10-CM | POA: Insufficient documentation

## 2024-02-19 DIAGNOSIS — K219 Gastro-esophageal reflux disease without esophagitis: Secondary | ICD-10-CM | POA: Diagnosis not present

## 2024-02-19 DIAGNOSIS — M48062 Spinal stenosis, lumbar region with neurogenic claudication: Secondary | ICD-10-CM | POA: Insufficient documentation

## 2024-02-19 HISTORY — PX: LUMBAR LAMINECTOMY/DECOMPRESSION MICRODISCECTOMY: SHX5026

## 2024-02-19 SURGERY — LUMBAR LAMINECTOMY/DECOMPRESSION MICRODISCECTOMY 1 LEVEL
Anesthesia: General | Site: Back | Laterality: Bilateral

## 2024-02-19 MED ORDER — FENTANYL CITRATE (PF) 100 MCG/2ML IJ SOLN
INTRAMUSCULAR | Status: AC
  Filled 2024-02-19: qty 2

## 2024-02-19 MED ORDER — SODIUM CHLORIDE 0.9% FLUSH
3.0000 mL | Freq: Two times a day (BID) | INTRAVENOUS | Status: DC
  Administered 2024-02-19: 3 mL via INTRAVENOUS

## 2024-02-19 MED ORDER — PANTOPRAZOLE SODIUM 40 MG PO TBEC
40.0000 mg | DELAYED_RELEASE_TABLET | Freq: Every day | ORAL | Status: DC
  Administered 2024-02-19: 40 mg via ORAL
  Filled 2024-02-19: qty 1

## 2024-02-19 MED ORDER — ATORVASTATIN CALCIUM 40 MG PO TABS
40.0000 mg | ORAL_TABLET | Freq: Every evening | ORAL | Status: DC
  Administered 2024-02-19: 40 mg via ORAL
  Filled 2024-02-19: qty 1

## 2024-02-19 MED ORDER — MENTHOL 3 MG MT LOZG: 1.0000 | LOZENGE | OROMUCOSAL | Status: DC | PRN

## 2024-02-19 MED ORDER — CYCLOBENZAPRINE HCL 10 MG PO TABS: 10.0000 mg | ORAL_TABLET | Freq: Three times a day (TID) | ORAL | Status: DC | PRN

## 2024-02-19 MED ORDER — THROMBIN 5000 UNITS EX SOLR
OROMUCOSAL | Status: DC | PRN
  Administered 2024-02-19: 5 mL

## 2024-02-19 MED ORDER — ONDANSETRON HCL 4 MG PO TABS: 4.0000 mg | ORAL_TABLET | Freq: Four times a day (QID) | ORAL | Status: DC | PRN

## 2024-02-19 MED ORDER — ORAL CARE MOUTH RINSE: 15.0000 mL | Freq: Once | OROMUCOSAL | Status: DC

## 2024-02-19 MED ORDER — HYDROCODONE-ACETAMINOPHEN 5-325 MG PO TABS
2.0000 | ORAL_TABLET | ORAL | Status: DC | PRN
  Administered 2024-02-20 (×2): 2 via ORAL
  Filled 2024-02-19 (×2): qty 2

## 2024-02-19 MED ORDER — THROMBIN (RECOMBINANT) 5000 UNITS EX SOLR
CUTANEOUS | Status: DC | PRN
  Administered 2024-02-19: 10 mL

## 2024-02-19 MED ORDER — 0.9 % SODIUM CHLORIDE (POUR BTL) OPTIME
TOPICAL | Status: DC | PRN
Start: 2024-02-19 — End: 2024-02-19
  Administered 2024-02-19: 1000 mL

## 2024-02-19 MED ORDER — SODIUM CHLORIDE 0.9% FLUSH: 3.0000 mL | INTRAVENOUS | Status: DC | PRN

## 2024-02-19 MED ORDER — CEFAZOLIN SODIUM-DEXTROSE 2-4 GM/100ML-% IV SOLN
2.0000 g | Freq: Three times a day (TID) | INTRAVENOUS | Status: DC
  Administered 2024-02-19 – 2024-02-20 (×2): 2 g via INTRAVENOUS
  Filled 2024-02-19 (×2): qty 100

## 2024-02-19 MED ORDER — ROCURONIUM BROMIDE 10 MG/ML (PF) SYRINGE
PREFILLED_SYRINGE | INTRAVENOUS | Status: DC | PRN
  Administered 2024-02-19: 10 mg via INTRAVENOUS
  Administered 2024-02-19: 50 mg via INTRAVENOUS

## 2024-02-19 MED ORDER — PHENYLEPHRINE HCL-NACL 20-0.9 MG/250ML-% IV SOLN
INTRAVENOUS | Status: DC | PRN
  Administered 2024-02-19: 20 ug/min via INTRAVENOUS

## 2024-02-19 MED ORDER — HYDROCODONE-ACETAMINOPHEN 5-325 MG PO TABS
1.0000 | ORAL_TABLET | ORAL | Status: DC | PRN
  Administered 2024-02-19: 1 via ORAL
  Filled 2024-02-19: qty 1

## 2024-02-19 MED ORDER — CHLORHEXIDINE GLUCONATE 0.12 % MT SOLN: 15.0000 mL | Freq: Once | OROMUCOSAL | Status: DC

## 2024-02-19 MED ORDER — DOCUSATE SODIUM 100 MG PO CAPS
100.0000 mg | ORAL_CAPSULE | Freq: Two times a day (BID) | ORAL | Status: DC | PRN
  Administered 2024-02-19: 100 mg via ORAL
  Filled 2024-02-19: qty 1

## 2024-02-19 MED ORDER — LIDOCAINE-EPINEPHRINE 1 %-1:100000 IJ SOLN
INTRAMUSCULAR | Status: AC
Start: 2024-02-19 — End: 2024-02-19
  Filled 2024-02-19: qty 1

## 2024-02-19 MED ORDER — ACETAMINOPHEN 650 MG RE SUPP: 650.0000 mg | RECTAL | Status: DC | PRN

## 2024-02-19 MED ORDER — CHLORHEXIDINE GLUCONATE CLOTH 2 % EX PADS: 6.0000 | MEDICATED_PAD | Freq: Once | CUTANEOUS | Status: DC

## 2024-02-19 MED ORDER — PROPOFOL 10 MG/ML IV BOLUS
INTRAVENOUS | Status: DC | PRN
  Administered 2024-02-19: 60 mg via INTRAVENOUS

## 2024-02-19 MED ORDER — PHENYLEPHRINE 80 MCG/ML (10ML) SYRINGE FOR IV PUSH (FOR BLOOD PRESSURE SUPPORT)
PREFILLED_SYRINGE | INTRAVENOUS | Status: DC | PRN
  Administered 2024-02-19: 80 ug via INTRAVENOUS
  Administered 2024-02-19: 40 ug via INTRAVENOUS
  Administered 2024-02-19 (×2): 80 ug via INTRAVENOUS
  Administered 2024-02-19: 120 ug via INTRAVENOUS

## 2024-02-19 MED ORDER — ALUM & MAG HYDROXIDE-SIMETH 200-200-20 MG/5ML PO SUSP
30.0000 mL | Freq: Four times a day (QID) | ORAL | Status: DC | PRN
  Administered 2024-02-20: 30 mL via ORAL
  Filled 2024-02-19: qty 30

## 2024-02-19 MED ORDER — BUPIVACAINE HCL (PF) 0.25 % IJ SOLN
INTRAMUSCULAR | Status: DC | PRN
Start: 2024-02-19 — End: 2024-02-19
  Administered 2024-02-19: 10 mL

## 2024-02-19 MED ORDER — SENNA 8.6 MG PO TABS
1.0000 | ORAL_TABLET | Freq: Two times a day (BID) | ORAL | Status: DC | PRN
  Administered 2024-02-19: 8.6 mg via ORAL
  Filled 2024-02-19: qty 1

## 2024-02-19 MED ORDER — PROSIGHT PO TABS
1.0000 | ORAL_TABLET | Freq: Every day | ORAL | Status: DC
  Administered 2024-02-19: 1 via ORAL
  Filled 2024-02-19: qty 1

## 2024-02-19 MED ORDER — ACETAMINOPHEN 325 MG PO TABS: 650.0000 mg | ORAL_TABLET | ORAL | Status: DC | PRN

## 2024-02-19 MED ORDER — PHENYLEPHRINE HCL-NACL 20-0.9 MG/250ML-% IV SOLN
INTRAVENOUS | Status: AC
  Filled 2024-02-19: qty 250

## 2024-02-19 MED ORDER — PANTOPRAZOLE SODIUM 40 MG IV SOLR: 40.0000 mg | Freq: Every day | INTRAVENOUS | Status: DC

## 2024-02-19 MED ORDER — PROPOFOL 10 MG/ML IV BOLUS
INTRAVENOUS | Status: AC
  Filled 2024-02-19: qty 20

## 2024-02-19 MED ORDER — ACETAMINOPHEN 10 MG/ML IV SOLN: 1000.0000 mg | Freq: Once | INTRAVENOUS | Status: DC | PRN

## 2024-02-19 MED ORDER — DEXAMETHASONE SODIUM PHOSPHATE 10 MG/ML IJ SOLN
INTRAMUSCULAR | Status: DC | PRN
  Administered 2024-02-19: 5 mg via INTRAVENOUS

## 2024-02-19 MED ORDER — MUSCLE RUB 10-15 % EX CREA: TOPICAL_CREAM | CUTANEOUS | Status: DC | PRN

## 2024-02-19 MED ORDER — LACTATED RINGERS IV SOLN: INTRAVENOUS | Status: DC

## 2024-02-19 MED ORDER — OXYCODONE HCL 5 MG PO TABS
5.0000 mg | ORAL_TABLET | Freq: Once | ORAL | Status: AC | PRN
  Administered 2024-02-19: 5 mg via ORAL

## 2024-02-19 MED ORDER — PHENOL 1.4 % MT LIQD: 1.0000 | OROMUCOSAL | Status: DC | PRN

## 2024-02-19 MED ORDER — CHLORHEXIDINE GLUCONATE 0.12 % MT SOLN
OROMUCOSAL | Status: AC
Start: 2024-02-19 — End: 2024-02-19
  Filled 2024-02-19: qty 15

## 2024-02-19 MED ORDER — SUGAMMADEX SODIUM 200 MG/2ML IV SOLN
INTRAVENOUS | Status: DC | PRN
  Administered 2024-02-19: 200 mg via INTRAVENOUS

## 2024-02-19 MED ORDER — BUPIVACAINE HCL (PF) 0.25 % IJ SOLN
INTRAMUSCULAR | Status: AC
  Filled 2024-02-19: qty 30

## 2024-02-19 MED ORDER — CEFAZOLIN SODIUM-DEXTROSE 2-4 GM/100ML-% IV SOLN
2.0000 g | INTRAVENOUS | Status: AC
  Administered 2024-02-19: 2 g via INTRAVENOUS
  Filled 2024-02-19: qty 100

## 2024-02-19 MED ORDER — NAPROXEN 250 MG PO TABS
250.0000 mg | ORAL_TABLET | Freq: Three times a day (TID) | ORAL | Status: DC
  Administered 2024-02-20: 250 mg via ORAL
  Filled 2024-02-19 (×2): qty 1

## 2024-02-19 MED ORDER — LIDOCAINE HCL (CARDIAC) PF 100 MG/5ML IV SOSY
PREFILLED_SYRINGE | INTRAVENOUS | Status: DC | PRN
  Administered 2024-02-19: 60 mg via INTRAVENOUS

## 2024-02-19 MED ORDER — ONDANSETRON HCL 4 MG/2ML IJ SOLN
INTRAMUSCULAR | Status: DC | PRN
  Administered 2024-02-19: 4 mg via INTRAVENOUS

## 2024-02-19 MED ORDER — PRESERVISION AREDS 2+MULTI VIT PO CAPS: ORAL_CAPSULE | Freq: Every day | ORAL | Status: DC

## 2024-02-19 MED ORDER — NAPROXEN 250 MG PO TABS
250.0000 mg | ORAL_TABLET | Freq: Three times a day (TID) | ORAL | Status: DC
  Administered 2024-02-19: 250 mg via ORAL
  Filled 2024-02-19 (×2): qty 1

## 2024-02-19 MED ORDER — PANTOPRAZOLE SODIUM 40 MG PO TBEC: 40.0000 mg | DELAYED_RELEASE_TABLET | Freq: Every day | ORAL | Status: DC

## 2024-02-19 MED ORDER — GABAPENTIN 300 MG PO CAPS
300.0000 mg | ORAL_CAPSULE | Freq: Two times a day (BID) | ORAL | Status: DC
  Administered 2024-02-19 – 2024-02-20 (×2): 300 mg via ORAL
  Filled 2024-02-19 (×2): qty 1

## 2024-02-19 MED ORDER — ZOLPIDEM TARTRATE 5 MG PO TABS
5.0000 mg | ORAL_TABLET | Freq: Every evening | ORAL | Status: DC | PRN
  Administered 2024-02-19: 5 mg via ORAL
  Filled 2024-02-19: qty 1

## 2024-02-19 MED ORDER — THROMBIN 5000 UNITS EX KIT
PACK | CUTANEOUS | Status: AC
  Filled 2024-02-19: qty 2

## 2024-02-19 MED ORDER — OXYCODONE HCL 5 MG PO TABS
ORAL_TABLET | ORAL | Status: AC
  Filled 2024-02-19: qty 1

## 2024-02-19 MED ORDER — FENTANYL CITRATE (PF) 250 MCG/5ML IJ SOLN
INTRAMUSCULAR | Status: AC
  Filled 2024-02-19: qty 5

## 2024-02-19 MED ORDER — OXYCODONE HCL 5 MG/5ML PO SOLN: 5.0000 mg | Freq: Once | ORAL | Status: AC | PRN

## 2024-02-19 MED ORDER — SODIUM CHLORIDE 0.9 % IV SOLN
250.0000 mL | INTRAVENOUS | Status: DC
  Administered 2024-02-19: 250 mL via INTRAVENOUS

## 2024-02-19 MED ORDER — FENTANYL CITRATE (PF) 100 MCG/2ML IJ SOLN
25.0000 ug | INTRAMUSCULAR | Status: DC | PRN
  Administered 2024-02-19: 25 ug via INTRAVENOUS

## 2024-02-19 MED ORDER — HYDROMORPHONE HCL 1 MG/ML IJ SOLN: 0.5000 mg | INTRAMUSCULAR | Status: DC | PRN

## 2024-02-19 MED ORDER — THROMBIN 5000 UNITS EX KIT
PACK | CUTANEOUS | Status: AC
  Filled 2024-02-19: qty 1

## 2024-02-19 MED ORDER — ONDANSETRON HCL 4 MG/2ML IJ SOLN: 4.0000 mg | Freq: Four times a day (QID) | INTRAMUSCULAR | Status: DC | PRN

## 2024-02-19 MED ORDER — LIDOCAINE-EPINEPHRINE 1 %-1:100000 IJ SOLN
INTRAMUSCULAR | Status: DC | PRN
  Administered 2024-02-19: 10 mL

## 2024-02-19 MED ORDER — TROLAMINE SALICYLATE 10 % EX CREA: 1.0000 | TOPICAL_CREAM | CUTANEOUS | Status: DC | PRN

## 2024-02-19 MED ORDER — TRAMADOL HCL 50 MG PO TABS: 50.0000 mg | ORAL_TABLET | Freq: Four times a day (QID) | ORAL | Status: DC | PRN

## 2024-02-19 MED ORDER — EPHEDRINE SULFATE-NACL 50-0.9 MG/10ML-% IV SOSY
PREFILLED_SYRINGE | INTRAVENOUS | Status: DC | PRN
  Administered 2024-02-19: 5 mg via INTRAVENOUS
  Administered 2024-02-19: 10 mg via INTRAVENOUS
  Administered 2024-02-19: 5 mg via INTRAVENOUS

## 2024-02-19 MED ORDER — FENTANYL CITRATE (PF) 250 MCG/5ML IJ SOLN
INTRAMUSCULAR | Status: DC | PRN
  Administered 2024-02-19: 100 ug via INTRAVENOUS

## 2024-02-19 SURGICAL SUPPLY — 44 items
BAG COUNTER SPONGE SURGICOUNT (BAG) ×2 IMPLANT
BAND RUBBER #18 3X1/16 STRL (MISCELLANEOUS) ×4 IMPLANT
BENZOIN TINCTURE PRP APPL 2/3 (GAUZE/BANDAGES/DRESSINGS) ×2 IMPLANT
BLADE CLIPPER SURG (BLADE) IMPLANT
BLADE SURG 11 STRL SS (BLADE) ×2 IMPLANT
BUR CUTTER 7.0 ROUND (BURR) ×2 IMPLANT
BUR MATCHSTICK NEURO 3.0 LAGG (BURR) ×2 IMPLANT
CANISTER SUCTION 3000ML PPV (SUCTIONS) ×2 IMPLANT
DERMABOND ADVANCED .7 DNX12 (GAUZE/BANDAGES/DRESSINGS) ×2 IMPLANT
DRAPE HALF SHEET 40X57 (DRAPES) IMPLANT
DRAPE LAPAROTOMY 100X72X124 (DRAPES) ×2 IMPLANT
DRAPE MICROSCOPE SLANT 54X150 (MISCELLANEOUS) ×2 IMPLANT
DRAPE SURG 17X23 STRL (DRAPES) ×2 IMPLANT
DRSG OPSITE POSTOP 4X6 (GAUZE/BANDAGES/DRESSINGS) IMPLANT
DURAPREP 26ML APPLICATOR (WOUND CARE) ×2 IMPLANT
ELECTRODE REM PT RTRN 9FT ADLT (ELECTROSURGICAL) ×2 IMPLANT
EVACUATOR 1/8 PVC DRAIN (DRAIN) IMPLANT
GAUZE 4X4 16PLY ~~LOC~~+RFID DBL (SPONGE) IMPLANT
GAUZE SPONGE 4X4 12PLY STRL (GAUZE/BANDAGES/DRESSINGS) ×2 IMPLANT
GLOVE BIO SURGEON STRL SZ7 (GLOVE) IMPLANT
GLOVE BIO SURGEON STRL SZ8 (GLOVE) ×2 IMPLANT
GLOVE BIOGEL PI IND STRL 7.0 (GLOVE) IMPLANT
GLOVE EXAM NITRILE XL STR (GLOVE) IMPLANT
GLOVE INDICATOR 8.5 STRL (GLOVE) ×4 IMPLANT
GOWN STRL REUS W/ TWL LRG LVL3 (GOWN DISPOSABLE) ×2 IMPLANT
GOWN STRL REUS W/ TWL XL LVL3 (GOWN DISPOSABLE) ×4 IMPLANT
GOWN STRL REUS W/TWL 2XL LVL3 (GOWN DISPOSABLE) IMPLANT
KIT BASIN OR (CUSTOM PROCEDURE TRAY) ×2 IMPLANT
KIT TURNOVER KIT B (KITS) ×2 IMPLANT
NDL HYPO 22X1.5 SAFETY MO (MISCELLANEOUS) ×2 IMPLANT
NDL SPNL 22GX3.5 QUINCKE BK (NEEDLE) ×2 IMPLANT
NEEDLE HYPO 22X1.5 SAFETY MO (MISCELLANEOUS) ×1 IMPLANT
NEEDLE SPNL 22GX3.5 QUINCKE BK (NEEDLE) ×1 IMPLANT
NS IRRIG 1000ML POUR BTL (IV SOLUTION) ×2 IMPLANT
PACK LAMINECTOMY NEURO (CUSTOM PROCEDURE TRAY) ×2 IMPLANT
SPIKE FLUID TRANSFER (MISCELLANEOUS) ×2 IMPLANT
SPONGE SURGIFOAM ABS GEL SZ50 (HEMOSTASIS) ×2 IMPLANT
STRIP CLOSURE SKIN 1/2X4 (GAUZE/BANDAGES/DRESSINGS) ×2 IMPLANT
SUT VIC AB 0 CT1 18XCR BRD8 (SUTURE) ×2 IMPLANT
SUT VIC AB 2-0 CT1 18 (SUTURE) ×2 IMPLANT
SUT VIC AB 4-0 PS2 18 (SUTURE) ×2 IMPLANT
TOWEL GREEN STERILE (TOWEL DISPOSABLE) ×2 IMPLANT
TOWEL GREEN STERILE FF (TOWEL DISPOSABLE) ×2 IMPLANT
WATER STERILE IRR 1000ML POUR (IV SOLUTION) ×2 IMPLANT

## 2024-02-19 NOTE — H&P (Addendum)
 Joanna Williams is an 83 y.o. female.   Chief Complaint: Back bilateral hip and leg pain and claudication HPI: 83 year old female progressive worsening back and bilateral hip and leg pain rating down the L4-L5 nerve root pattern and difficulty walking and claudication.  Workup revealed severe spinal stenosis at L4-5 with complete block of CSF flow by MRI criteria.  Due to the patient failed conservative treatment imaging findings and progression of clinical syndrome I recommended decompressive laminectomy bilaterally at that level.  I extensively reviewed the risks and benefits of the operation with the patient as well as perioperative course expectations of outcome and alternatives to surgery and she understood and agreed to proceed forward.  Past Medical History:  Diagnosis Date   GERD (gastroesophageal reflux disease)    Heart murmur    Hyperlipidemia    Macular degeneration 05/04/2017    Past Surgical History:  Procedure Laterality Date   CARPAL TUNNEL RELEASE Left 11/2023   gramig   CATARACT EXTRACTION Bilateral 2014   COLONOSCOPY  2 yrs ago   TOTAL HIP ARTHROPLASTY  08/10/2011   Procedure: TOTAL HIP ARTHROPLASTY;  Surgeon: Dempsey JINNY Sensor, MD;  Location: [facility] OR;  Service: Orthopedics;  Laterality: Right;  DEPUY/ PENNACLE POLY OR CERAMIC   TOTAL HIP ARTHROPLASTY Left 11/08/2022   Procedure: TOTAL HIP ARTHROPLASTY;  Surgeon: Josefina Chew, MD;  Location: [facility];  Service: Orthopedics;  Laterality: Left;    Family History  Problem Relation Age of Onset   Heart disease Mother    Cancer Father 57       brain   Social History:  reports that she has never smoked. She has never used smokeless tobacco. She reports that she does not currently use alcohol after a past usage of about 1.0 standard drink of alcohol per week. She reports that she does not currently use drugs.  Allergies:  Allergies  Allergen Reactions   Niacin Nausea Only    Medications Prior to Admission  Medication  Sig Dispense Refill   atorvastatin  (LIPITOR) 40 MG tablet Take 1 tablet (40 mg total) by mouth daily. (Patient taking differently: Take 40 mg by mouth every evening.) 90 tablet 0   gabapentin  (NEURONTIN ) 300 MG capsule Take 300 mg by mouth 2 (two) times daily.     meloxicam  (MOBIC ) 7.5 MG tablet Take 7.5 mg by mouth 2 (two) times daily.     Multiple Vitamins-Minerals (PRESERVISION AREDS 2+MULTI VIT PO) Take 1 tablet by mouth at bedtime.     omeprazole  (PRILOSEC ) 20 MG capsule Take 1 capsule (20 mg total) by mouth daily. (Patient taking differently: Take 20 mg by mouth every evening.) 90 capsule 0   traMADol  (ULTRAM ) 50 MG tablet Take 50 mg by mouth every 6 (six) hours as needed (pain.).     trolamine salicylate (ASPERCREME) 10 % cream Apply 1 Application topically as needed for muscle pain.     zolpidem  (AMBIEN ) 5 MG tablet TAKE 1 TABLET BY MOUTH EVERYDAY AT BEDTIME (Patient taking differently: Take 5 mg by mouth at bedtime as needed (sleep).) 90 tablet 0    No results found for this or any previous visit (from the past 48 hours). No results found.  Review of Systems  Musculoskeletal:  Positive for back pain.  Neurological:  Positive for weakness and numbness.    Blood pressure (!) 179/66, pulse 68, temperature 97.8 F (36.6 C), resp. rate 18, height 5' 4 (1.626 m), weight 61.2 kg, SpO2 95%. Physical Exam HENT:  Head: Normocephalic.     Right Ear: Tympanic membrane normal.     Nose: Nose normal.     Mouth/Throat:     Mouth: Mucous membranes are moist.  Cardiovascular:     Rate and Rhythm: Normal rate.     Pulses: Normal pulses.  Pulmonary:     Effort: Pulmonary effort is normal.  Musculoskeletal:        General: Normal range of motion.     Cervical back: Normal range of motion.  Neurological:     Mental Status: She is alert.     Comments: Strength 5 out of 5 iliopsoas, quads, hamstrings, gastroc, and tibialis, and EHL on the left on the right she has got a partial foot drop  with about 4-5 EHL and 4 out of 5 dorsiflexion weakness      Assessment/Plan 83-year-old presents for bilateral decompressive laminectomy L4-5  Joanna SHAUNNA Helling, MD 02/19/2024, 12:46 PM

## 2024-02-19 NOTE — Anesthesia Procedure Notes (Signed)
 Procedure Name: Intubation Date/Time: 02/19/2024 1:35 PM  Performed by: Cindie Donald CROME, CRNAPre-anesthesia Checklist: Patient identified, Emergency Drugs available, Suction available and Patient being monitored Patient Re-evaluated:Patient Re-evaluated prior to induction Oxygen Delivery Method: Circle System Utilized Preoxygenation: Pre-oxygenation with 100% oxygen Induction Type: IV induction Ventilation: Mask ventilation without difficulty Laryngoscope Size: Mac and 3 Grade View: Grade I Tube type: Oral Tube size: 7.0 mm Number of attempts: 1 Airway Equipment and Method: Stylet Placement Confirmation: ETT inserted through vocal cords under direct vision, positive ETCO2 and breath sounds checked- equal and bilateral Secured at: 21 cm Tube secured with: Tape Dental Injury: Teeth and Oropharynx as per pre-operative assessment

## 2024-02-19 NOTE — Op Note (Signed)
 Preoperative diagnosis: Lumbar spinal stenosis L4-5 with bilateral L4-L5 radiculopathies and neurogenic claudication.  Postoperative diagnosis: Same.  Procedure: Bilateral decompressive lumbar laminectomy L4-5 with foraminotomies the L4-L5 nerve roots bilaterally partial facetectomies.  Surgeon: Arley helling.  Assistant: Suzen Click.  Anesthesia: General  EBL: Minimal.  HPI: 83 year old female progressive worsening back pain bilateral leg pain with neurogenic claudication.  Workup revealed severe spinal stenosis at L4-5 with severe compression of the L4-L5 nerve roots.  She had a partial right foot drop and failed conservative treatment and progression of clinical syndrome I recommended decompressive laminectomy with foraminotomies partial facetectomies at that level.  I extensively reviewed the risks and benefits of the operation with the patient as well as perioperative course expectations of outcome and alternatives to surgery she understood and agreed to proceed forward.  Operative procedure: Patient was brought into the OR was induced under general anesthesia positioned prone the Wilson frame her back was prepped and draped in routine sterile fashion.  Preoperative x-ray localized the appropriate level so after infiltration of 10 cc lidocaine  with epi midline incision was made and Bovie electrocautery was used to take down the subcutaneous tissue and subperiosteal dissection carried lamina of L4 and L5 bilaterally.  Interoperative x-ray confirmed defecation appropriate level so the spinous process at L4 was removed superior aspect the spinous process at L5 send decompression was begun with a high-speed drill and a 2 and 3 Miller Kerrison punch.  I marched up and performed virtual complete laminectomy of L4 I started seeing epidural fat and no further compression so left that started marching inferiorly marked hourglass compression of thecal sac was noted to level of the space this was teased off  the dura with 4 Penfield and partial facetectomies were performed by marching down the medial gutter on both sides.  Performed foraminotomies the L4 and L5 nerve roots bilaterally and alleviated spur and decompression was achieved with complete central decompression and foraminal decompression.  Wound was then copiously again meticulous hemostasis was maintained placed a medium Hemovac drain in close the wound in layers with interrupted Vicryl and a running 4 subcuticular.  Dermabond benzoin Steri-Strips and sterile dressing was applied patient to cover him in stable condition.  At the end the case all needle count sponge counts were correct.

## 2024-02-19 NOTE — Anesthesia Preprocedure Evaluation (Signed)
 Anesthesia Evaluation  Patient identified by MRN, date of birth, ID band Patient awake    Reviewed: Allergy & Precautions, NPO status , Patient's Chart, lab work & pertinent test results  History of Anesthesia Complications Negative for: history of anesthetic complications  Airway Mallampati: III  TM Distance: >3 FB Neck ROM: Full    Dental  (+) Teeth Intact, Dental Advisory Given   Pulmonary neg shortness of breath, neg sleep apnea, neg COPD, neg recent URI   breath sounds clear to auscultation       Cardiovascular (-) hypertension(-) angina (-) Past MI and (-) CHF  Rhythm:Regular   1. Left ventricular ejection fraction, by visual estimation, is 60 to  65%. The left ventricle has normal function. There is mildly increased  concentric left ventricular hypertrophy.   2. The left ventricle has no regional wall motion abnormalities.   3. Left ventricular diastolic parameters are consistent with Grade I  diastolic dysfunction (impaired relaxation).   4. Global right ventricle has normal systolic function.The right  ventricular size is normal. No increase in right ventricular wall  thickness.   5. Left atrial size was normal.   6. Right atrial size was normal.   7. The mitral valve is normal in structure. Mild mitral valve  regurgitation. No evidence of mitral stenosis.   8. The tricuspid valve is normal in structure. Mild Tricuspid  Regurgitation.   9. The aortic valve is normal in structure. Aortic valve regurgitation is  not visualized. No evidence of aortic valve sclerosis or stenosis.  10. The pulmonic valve was not assessed. Pulmonic valve regurgitation is  not visualized.  11. Normal pulmonary artery systolic pressure.  12. No prior study for comparison.     Neuro/Psych neg Seizures  Neuromuscular disease    GI/Hepatic Neg liver ROS,GERD  Medicated and Controlled,,  Endo/Other    Renal/GU negative Renal ROS      Musculoskeletal negative musculoskeletal ROS (+)    Abdominal   Peds  Hematology negative hematology ROS (+) Lab Results      Component                Value               Date                      WBC                      8.3                 02/12/2024                HGB                      14.1                02/12/2024                HCT                      44.7                02/12/2024                MCV                      100.0  02/12/2024                PLT                      230                 02/12/2024              Anesthesia Other Findings   Reproductive/Obstetrics                              Anesthesia Physical Anesthesia Plan  ASA: 2  Anesthesia Plan: General   Post-op Pain Management: Ofirmev  IV (intra-op)*   Induction: Intravenous  PONV Risk Score and Plan: 4 or greater and Ondansetron  and Dexamethasone   Airway Management Planned: Oral ETT  Additional Equipment: None  Intra-op Plan:   Post-operative Plan: Extubation in OR  Informed Consent: I have reviewed the patients History and Physical, chart, labs and discussed the procedure including the risks, benefits and alternatives for the proposed anesthesia with the patient or authorized representative who has indicated his/her understanding and acceptance.     Dental advisory given  Plan Discussed with: CRNA  Anesthesia Plan Comments:          Anesthesia Quick Evaluation

## 2024-02-19 NOTE — Plan of Care (Signed)

## 2024-02-19 NOTE — Transfer of Care (Signed)
 Immediate Anesthesia Transfer of Care Note  Patient: Joanna Williams  Procedure(s) Performed: LUMBAR LAMINECTOMY/DECOMPRESSION MICRODISCECTOMY 1 LEVEL (Bilateral: Back)  Patient Location: PACU  Anesthesia Type:General  Level of Consciousness: awake, alert , oriented, and patient cooperative  Airway & Oxygen Therapy: Patient Spontanous Breathing and Patient connected to nasal cannula oxygen  Post-op Assessment: Report given to RN, Post -op Vital signs reviewed and stable, and Patient moving all extremities X 4  Post vital signs: Reviewed and stable  Last Vitals:  Vitals Value Taken Time  BP 158/78 02/19/24 15:09  Temp    Pulse 78 02/19/24 15:12  Resp 18 02/19/24 15:12  SpO2 93 % 02/19/24 15:12  Vitals shown include unfiled device data.  Last Pain:  Vitals:   02/19/24 1107  PainSc: 8          Complications: No notable events documented.

## 2024-02-20 ENCOUNTER — Encounter (HOSPITAL_COMMUNITY): Payer: Self-pay | Admitting: Neurosurgery

## 2024-02-20 DIAGNOSIS — E785 Hyperlipidemia, unspecified: Secondary | ICD-10-CM | POA: Diagnosis not present

## 2024-02-20 DIAGNOSIS — K219 Gastro-esophageal reflux disease without esophagitis: Secondary | ICD-10-CM | POA: Diagnosis not present

## 2024-02-20 DIAGNOSIS — M48062 Spinal stenosis, lumbar region with neurogenic claudication: Secondary | ICD-10-CM | POA: Diagnosis not present

## 2024-02-20 DIAGNOSIS — M5416 Radiculopathy, lumbar region: Secondary | ICD-10-CM | POA: Diagnosis not present

## 2024-02-20 MED ORDER — HYDROCODONE-ACETAMINOPHEN 5-325 MG PO TABS: 1.0000 | ORAL_TABLET | ORAL | 0 refills | Status: DC | PRN

## 2024-02-20 MED ORDER — CYCLOBENZAPRINE HCL 10 MG PO TABS: 10.0000 mg | ORAL_TABLET | Freq: Three times a day (TID) | ORAL | 0 refills | Status: DC | PRN

## 2024-02-20 NOTE — Evaluation (Addendum)
 Occupational Therapy Evaluation Patient Details Name: Joanna Williams MRN: [id number] DOB: December 07, 1940 Today's Date: 02/20/2024   History of Present Illness   Pt is an 83 y/o F s/p bil decompressive lumbar laminectomy L 4-5 with foraminectomies and L4-5 nerve roots. PMH includes GERD, HLD, macular degeneration, bil THA     Clinical Impressions Pt ind at baseline with ADL/functional mobility, lives with spouse who can assist at d/c. Pt currently needing up to min A for ADLs, CGA for bed mobility and mod I for transfers with straight cane. Pt with mild instability. Educated on back precautions, and compensatory strategies for ADLs. Discussed 3in1 as use of shower seat at home and having a SPC upstairs and downstairs for mobility. Pt verbalizes understanding, handout provided. Pt presenting with impairments listed below, however has not further acute OT needs at this time, will s/o. Please reconsult if there is a change in pt status. Anticipate no OT follow up needs at d/c.      If plan is discharge home, recommend the following:   A little help with walking and/or transfers;A little help with bathing/dressing/bathroom;Assistance with cooking/housework;Assist for transportation     Functional Status Assessment   Patient has had a recent decline in their functional status and demonstrates the ability to make significant improvements in function in a reasonable and predictable amount of time.     Equipment Recommendations   BSC/3in1     Recommendations for Other Services         Precautions/Restrictions   Precautions Precautions: Cervical Precaution Booklet Issued: Yes (comment) Recall of Precautions/Restrictions: Intact Precaution/Restrictions Comments: educated on back prec Required Braces or Orthoses: Spinal Brace Spinal Brace: Lumbar corset;Applied in sitting position Restrictions Weight Bearing Restrictions Per Provider Order: No     Mobility Bed  Mobility Overal bed mobility: Needs Assistance Bed Mobility: Rolling, Sidelying to Sit, Sit to Sidelying Rolling: Contact guard assist Sidelying to sit: Contact guard assist     Sit to sidelying: Contact guard assist General bed mobility comments: via log roll    Transfers Overall transfer level: Modified independent Equipment used: Straight cane                      Balance                                           ADL either performed or assessed with clinical judgement   ADL Overall ADL's : Needs assistance/impaired Eating/Feeding: Set up   Grooming: Set up;Sitting   Upper Body Bathing: Minimal assistance;Sitting   Lower Body Bathing: Sitting/lateral leans;Minimal assistance   Upper Body Dressing : Minimal assistance;Sitting   Lower Body Dressing: Minimal assistance   Toilet Transfer: Contact guard assist;Ambulation   Toileting- Clothing Manipulation and Hygiene: Contact guard assist       Functional mobility during ADLs: Contact guard assist       Vision   Vision Assessment?: No apparent visual deficits     Perception Perception: Not tested       Praxis Praxis: Not tested       Pertinent Vitals/Pain Pain Assessment Pain Assessment: Faces Pain Score: 0-No pain     Extremity/Trunk Assessment Upper Extremity Assessment Upper Extremity Assessment: Overall WFL for tasks assessed   Lower Extremity Assessment Lower Extremity Assessment: Overall WFL for tasks assessed       Communication Communication Communication: No apparent  difficulties   Cognition Arousal: Alert Behavior During Therapy: WFL for tasks assessed/performed Cognition: No apparent impairments                               Following commands: Intact       Cueing  General Comments   Cueing Techniques: Verbal cues  VSS   Exercises     Shoulder Instructions      Home Living Family/patient expects to be discharged to:: Private  residence Living Arrangements: Spouse/significant other Available Help at Discharge: Family Type of Home: House Home Access: Stairs to enter Secretary/administrator of Steps: 2 Entrance Stairs-Rails: Can reach both Home Layout: Two level     Bathroom Shower/Tub: Producer, television/film/video: Standard Bathroom Accessibility: Yes   Home Equipment: Cane - single point;Cane - quad          Prior Functioning/Environment Prior Level of Function : Independent/Modified Independent                    OT Problem List: Decreased strength;Decreased range of motion;Impaired balance (sitting and/or standing);Decreased activity tolerance   OT Treatment/Interventions:        OT Goals(Current goals can be found in the care plan section)   Acute Rehab OT Goals Patient Stated Goal: none stated OT Goal Formulation: With patient Time For Goal Achievement: 03/05/24 Potential to Achieve Goals: Good   OT Frequency:       Co-evaluation              AM-PAC OT 6 Clicks Daily Activity     Outcome Measure Help from another person eating meals?: A Little Help from another person taking care of personal grooming?: A Little Help from another person toileting, which includes using toliet, bedpan, or urinal?: A Little Help from another person bathing (including washing, rinsing, drying)?: A Little Help from another person to put on and taking off regular upper body clothing?: A Little Help from another person to put on and taking off regular lower body clothing?: A Little 6 Click Score: 18   End of Session Equipment Utilized During Treatment: Gait belt Nurse Communication: Mobility status  Activity Tolerance: Patient tolerated treatment well Patient left: with call bell/phone within reach;in bed  OT Visit Diagnosis: Unsteadiness on feet (R26.81);Other abnormalities of gait and mobility (R26.89);Muscle weakness (generalized) (M62.81)                Time: 9249-9180 OT Time  Calculation (min): 29 min Charges:  OT General Charges $OT Visit: 1 Visit OT Evaluation $OT Eval Low Complexity: 1 Low OT Treatments $Self Care/Home Management : 8-22 mins  Joanna Williams K, OTD, OTR/L SecureChat Preferred Acute Rehab [phone]   Joanna Williams 02/20/2024, 9:44 AM

## 2024-02-20 NOTE — Discharge Summary (Signed)
 Physician Discharge Summary  Patient ID: Joanna Williams MRN: [id number] DOB/AGE: [birth year] 83 y.o.  Admit date: 02/19/2024 Discharge date: 02/20/2024  Admission Diagnoses: Lumbar spinal stenosis L4-5 with bilateral L4-L5 radiculopathies and neurogenic claudication.     Discharge Diagnoses: same   Discharged Condition: good  Hospital Course: The patient was admitted on 02/19/2024 and taken to the operating room where the patient underwent lami L4-5. The patient tolerated the procedure well and was taken to the recovery room and then to the floor in stable condition. The hospital course was routine. There were no complications. The wound remained clean dry and intact. Pt had appropriate back soreness. No complaints of leg pain or new N/T/W. The patient remained afebrile with stable vital signs, and tolerated a regular diet. The patient continued to increase activities, and pain was well controlled with oral pain medications.   Consults: None  Significant Diagnostic Studies:  Results for orders placed or performed during the hospital encounter of 02/12/24  Surgical pcr screen   Collection Time: 02/12/24 10:03 AM   Specimen: Nasal Mucosa; Nasal Swab  Result Value Ref Range   MRSA, PCR NEGATIVE NEGATIVE   Staphylococcus aureus NEGATIVE NEGATIVE  Basic metabolic panel per protocol   Collection Time: 02/12/24 10:33 AM  Result Value Ref Range   Sodium 137 135 - 145 mmol/L   Potassium 4.2 3.5 - 5.1 mmol/L   Chloride 103 98 - 111 mmol/L   CO2 26 22 - 32 mmol/L   Glucose, Bld 94 70 - 99 mg/dL   BUN 12 8 - 23 mg/dL   Creatinine, Ser 9.29 0.44 - 1.00 mg/dL   Calcium  9.1 8.9 - 10.3 mg/dL   GFR, Estimated >39 >39 mL/min   Anion gap 8 5 - 15  CBC per protocol   Collection Time: 02/12/24 10:33 AM  Result Value Ref Range   WBC 8.3 4.0 - 10.5 K/uL   RBC 4.47 3.87 - 5.11 MIL/uL   Hemoglobin 14.1 12.0 - 15.0 g/dL   HCT 55.2 63.9 - 53.9 %   MCV 100.0 80.0 - 100.0 fL   MCH 31.5 26.0 - 34.0  pg   MCHC 31.5 30.0 - 36.0 g/dL   RDW 86.7 88.4 - 84.4 %   Platelets 230 150 - 400 K/uL   nRBC 0.0 0.0 - 0.2 %    DG Lumbar Spine 2-3 Views Result Date: 02/19/2024 CLINICAL DATA:  Elective surgery EXAM: LUMBAR SPINE - 2-3 VIEW COMPARISON:  MRI 01/10/2024 FINDINGS: Limited cross-table lateral intraoperative views of the lumbar spine. Image labeled film 1 demonstrates linear localizing instrument overlying the posterior soft tissues at the L5-S1 level. Image labeled film 2 demonstrates surgical instruments posterior to the L4-L5 level. IMPRESSION: Limited lateral views of the lumbar spine obtained intraoperatively for localization purposes. Electronically Signed   By: Luke Bun M.D.   On: 02/19/2024 17:36    Antibiotics:  Anti-infectives (From admission, onward)    Start     Dose/Rate Route Frequency Ordered Stop   02/19/24 2200  ceFAZolin  (ANCEF ) IVPB 2g/100 mL premix        2 g 200 mL/hr over 30 Minutes Intravenous Every 8 hours 02/19/24 1614 02/21/24 2159   02/19/24 1045  ceFAZolin  (ANCEF ) IVPB 2g/100 mL premix        2 g 200 mL/hr over 30 Minutes Intravenous On call to O.R. 02/19/24 1041 02/19/24 1418       Discharge Exam: Blood pressure 120/61, pulse (!) 57, temperature 97.7 F (36.5 C), temperature  source Oral, resp. rate 18, height 5' 4 (1.626 m), weight 61.2 kg, SpO2 96%. Neurologic: Grossly normal Ambulating and voiding well incision cdi   Discharge Medications:   Allergies as of 02/20/2024       Reactions   Niacin Nausea Only        Medication List     STOP taking these medications    traMADol  50 MG tablet Commonly known as: ULTRAM        TAKE these medications    atorvastatin  40 MG tablet Commonly known as: LIPITOR Take 1 tablet (40 mg total) by mouth daily. What changed: when to take this   cyclobenzaprine  10 MG tablet Commonly known as: FLEXERIL  Take 1 tablet (10 mg total) by mouth 3 (three) times daily as needed for muscle spasms.    gabapentin  300 MG capsule Commonly known as: NEURONTIN  Take 300 mg by mouth 2 (two) times daily.   HYDROcodone -acetaminophen  5-325 MG tablet Commonly known as: NORCO/VICODIN Take 1 tablet by mouth every 4 (four) hours as needed for moderate pain (pain score 4-6).   meloxicam  7.5 MG tablet Commonly known as: MOBIC  Take 7.5 mg by mouth 2 (two) times daily.   omeprazole  20 MG capsule Commonly known as: PRILOSEC  Take 1 capsule (20 mg total) by mouth daily. What changed: when to take this   PRESERVISION AREDS 2+MULTI VIT PO Take 1 tablet by mouth at bedtime.   trolamine salicylate 10 % cream Commonly known as: ASPERCREME Apply 1 Application topically as needed for muscle pain.   zolpidem  5 MG tablet Commonly known as: AMBIEN  TAKE 1 TABLET BY MOUTH EVERYDAY AT BEDTIME What changed: See the new instructions.        Disposition: home   Final Dx: lami L4-5 bilateral  Discharge Instructions      Remove dressing in 72 hours   Complete by: As directed    Call MD for:   Complete by: As directed    Call MD for:  difficulty breathing, headache or visual disturbances   Complete by: As directed    Call MD for:  hives   Complete by: As directed    Call MD for:  persistant dizziness or light-headedness   Complete by: As directed    Call MD for:  persistant nausea and vomiting   Complete by: As directed    Call MD for:  redness, tenderness, or signs of infection (pain, swelling, redness, odor or green/yellow discharge around incision site)   Complete by: As directed    Call MD for:  severe uncontrolled pain   Complete by: As directed    Call MD for:  temperature >100.4   Complete by: As directed    Diet - low sodium heart healthy   Complete by: As directed    Driving Restrictions   Complete by: As directed    No driving for 2 weeks, no riding in the car for 1 week   Increase activity slowly   Complete by: As directed    Lifting restrictions   Complete by: As directed     No lifting more than 8 lbs          Signed: Suzen Lacks Dirck Butch 02/20/2024, 7:42 AM

## 2024-02-20 NOTE — Anesthesia Postprocedure Evaluation (Signed)
 Anesthesia Post Note  Patient: Joanna Williams  Procedure(s) Performed: LUMBAR LAMINECTOMY/DECOMPRESSION MICRODISCECTOMY 1 LEVEL (Bilateral: Back)     Patient location during evaluation: PACU Anesthesia Type: General Level of consciousness: awake and alert Pain management: pain level controlled Vital Signs Assessment: post-procedure vital signs reviewed and stable Respiratory status: spontaneous breathing, nonlabored ventilation and respiratory function stable Cardiovascular status: blood pressure returned to baseline and stable Postop Assessment: no apparent nausea or vomiting Anesthetic complications: no   No notable events documented.                 Domenico Achord

## 2024-02-20 NOTE — Progress Notes (Signed)
 Patient discharged home per MD order. Patient Understand discharge instructions with stated understanding.

## 2024-02-21 MED FILL — Thrombin For Soln 5000 Unit: CUTANEOUS | Qty: 2 | Status: AC

## 2024-03-07 ENCOUNTER — Other Ambulatory Visit: Payer: Self-pay | Admitting: Family Medicine

## 2024-03-07 DIAGNOSIS — K219 Gastro-esophageal reflux disease without esophagitis: Secondary | ICD-10-CM

## 2024-03-16 ENCOUNTER — Other Ambulatory Visit: Payer: Self-pay | Admitting: Family Medicine

## 2024-03-16 DIAGNOSIS — G4700 Insomnia, unspecified: Secondary | ICD-10-CM

## 2024-03-18 DIAGNOSIS — H26493 Other secondary cataract, bilateral: Secondary | ICD-10-CM | POA: Diagnosis not present

## 2024-03-18 DIAGNOSIS — H353211 Exudative age-related macular degeneration, right eye, with active choroidal neovascularization: Secondary | ICD-10-CM | POA: Diagnosis not present

## 2024-03-18 DIAGNOSIS — H353223 Exudative age-related macular degeneration, left eye, with inactive scar: Secondary | ICD-10-CM | POA: Diagnosis not present

## 2024-03-18 DIAGNOSIS — H43813 Vitreous degeneration, bilateral: Secondary | ICD-10-CM | POA: Diagnosis not present

## 2024-03-18 NOTE — Telephone Encounter (Signed)
 Requesting: Ambien  5mg   Contract: 01/02/24 UDS: 01/02/24 Last Visit: 01/08/24 Next Visit: None Last Refill: 12/11/23 #90 and 0RF   Please Advise

## 2024-03-20 ENCOUNTER — Other Ambulatory Visit: Payer: Self-pay | Admitting: Family Medicine

## 2024-03-20 DIAGNOSIS — G4700 Insomnia, unspecified: Secondary | ICD-10-CM

## 2024-03-20 NOTE — Telephone Encounter (Signed)
 Copied from CRM [id number]. Topic: Clinical - Medication Refill >> Mar 20, 2024 11:05 AM Thersia C wrote: Medication: zolpidem  (AMBIEN ) 5 MG tablet  Has the patient contacted their pharmacy? Yes (Agent: If no, request that the patient contact the pharmacy for the refill. If patient does not wish to contact the pharmacy document the reason why and proceed with request.) (Agent: If yes, when and what did the pharmacy advise?)  This is the patient's preferred pharmacy:  [id number] [address] [place], [place] - [address] [place] [address] Phone: [phone] Fax: [phone]  Is this the correct pharmacy for this prescription? Yes If no, delete pharmacy and type the correct one.   Has the prescription been filled recently? No  Is the patient out of the medication? Yes  Has the patient been seen for an appointment in the last year OR does the patient have an upcoming appointment? Yes  Can we respond through MyChart? Yes  Agent: Please be advised that Rx refills may take up to 3 business days. We ask that you follow-up with your pharmacy.

## 2024-04-01 ENCOUNTER — Other Ambulatory Visit: Payer: Self-pay | Admitting: Family Medicine

## 2024-04-01 NOTE — Telephone Encounter (Signed)
 Prescribed by Kimberly Meyran at neurosurgery. Error CRM sent.

## 2024-04-01 NOTE — Telephone Encounter (Signed)
 Copied from CRM #[id number]. Topic: Clinical - Medication Refill >> Apr 01, 2024 10:49 AM Frederich PARAS wrote: Medication: HYDROcodone -acetaminophen  (NORCO/VICODIN) 5-325 MG tablet  Has the patient contacted their pharmacy? No Pt says she knew they would not fillit until contacting us    This is the patient's preferred pharmacy:  [id number] [address], [place] - [address] [place] [address] Phone: [phone] Fax: [phone]  Is this the correct pharmacy for this prescription? Yes If no, delete pharmacy and type the correct one.   Has the prescription been filled recently? Yes  Is the patient out of the medication? Yes  Has the patient been seen for an appointment in the last year OR does the patient have an upcoming appointment? Yes  Can we respond through MyChart? Yes  Agent: Please be advised that Rx refills may take up to 3 business days. We ask that you follow-up with your pharmacy.

## 2024-04-05 NOTE — Telephone Encounter (Signed)
 I called the patient and let her know that she would need to call her neurologist office to get this prescription refilled. Patient had no further questions or concerns.

## 2024-04-06 ENCOUNTER — Other Ambulatory Visit: Payer: Self-pay | Admitting: Family Medicine

## 2024-04-06 DIAGNOSIS — M5442 Lumbago with sciatica, left side: Secondary | ICD-10-CM

## 2024-04-06 DIAGNOSIS — M25552 Pain in left hip: Secondary | ICD-10-CM

## 2024-04-24 NOTE — Therapy (Signed)
 OUTPATIENT PHYSICAL THERAPY THORACOLUMBAR EVALUATION   Patient Name: Joanna Williams MRN: [id number] DOB:1940-10-27, 83 y.o., female Today's Date: 04/29/2024  END OF SESSION:  PT End of Session - 04/29/24 0930     Visit Number 1    Date for Recertification  06/24/24    Authorization Type [employer]    Authorization Time Period auth pending    Authorization - Visit Number 1    PT Start Time 0930    PT Stop Time 1018    PT Time Calculation (min) 48 min    Activity Tolerance Patient tolerated treatment well    Behavior During Therapy [facility] for tasks assessed/performed          Past Medical History:  Diagnosis Date   GERD (gastroesophageal reflux disease)    Heart murmur    Hyperlipidemia    Macular degeneration 05/04/2017   Past Surgical History:  Procedure Laterality Date   CARPAL TUNNEL RELEASE Left 11/2023   gramig   CATARACT EXTRACTION Bilateral 2014   COLONOSCOPY  2 yrs ago   LUMBAR LAMINECTOMY/DECOMPRESSION MICRODISCECTOMY Bilateral 02/19/2024   Procedure: LUMBAR LAMINECTOMY/DECOMPRESSION MICRODISCECTOMY 1 LEVEL;  Surgeon: Onetha Kuba, MD;  Location: [facility] OR;  Service: Neurosurgery;  Laterality: Bilateral;  Laminectomy and Foraminotomy - L4-L5 - bilateral   TOTAL HIP ARTHROPLASTY  08/10/2011   Procedure: TOTAL HIP ARTHROPLASTY;  Surgeon: Dempsey JINNY Sensor, MD;  Location: [facility] OR;  Service: Orthopedics;  Laterality: Right;  DEPUY/ PENNACLE POLY OR CERAMIC   TOTAL HIP ARTHROPLASTY Left 11/08/2022   Procedure: TOTAL HIP ARTHROPLASTY;  Surgeon: Josefina Chew, MD;  Location: [facility] ORS;  Service: Orthopedics;  Laterality: Left;   Patient Active Problem List   Diagnosis Date Noted   Spinal stenosis of lumbar region 02/19/2024   Left hip pain 09/26/2022   Insomnia 09/26/2022   Pre-op examination 09/26/2022   Preventative health care 07/18/2021   Age-related osteoporosis without current pathological fracture 06/04/2019   Murmur 06/04/2019   Tongue lesion 06/04/2019   Low  back pain with radiation 06/04/2019   Right lower quadrant abdominal mass 01/24/2019   Carpal tunnel syndrome on both sides 11/06/2017   UTI (urinary tract infection) 05/12/2014   Sciatica 02/04/2014   GERD (gastroesophageal reflux disease) 08/13/2012   Hypothyroidism 06/15/2010   Benign neoplasm of skin 11/27/2009   Hyperlipidemia 02/13/2007    PCP: Antonio Cyndee Jamee JONELLE, DO   REFERRING PROVIDER: Onetha Kuba, MD   REFERRING DIAG: [id number] (ICD-10-CM) - Spinal stenosis, lumbar region with neurogenic claudication   THERAPY DIAG:  No diagnosis found.  RATIONALE FOR EVALUATION AND TREATMENT: Rehabilitation  ONSET DATE: 02/19/24 - Bilateral decompressive lumbar laminectomy L4-5 with foraminotomies the L4-L5 nerve roots bilaterally partial facetectomies.   NEXT MD VISIT:  None scheduled   SUBJECTIVE:  SUBJECTIVE STATEMENT: Pt reports ever since her back surgery in August, she states her legs feel weak and her balance is off.  She has to be very careful not to fall, but has not had any falls.  Not able to be active like she previously was (playing pickle ball).  She denies any pain, numbness or tingling.  PAIN: Are you having pain? No  PERTINENT HISTORY:  B L4-5 laminectomies & foraminectomies/partial facetectomies 02/19/24; L THA 11/2022, R THA 2013, L carpal tunnel release 11/2023, osteoporosis, GERD, macular degeneration, HLD, insomnia  PRECAUTIONS: None  RED FLAGS: None  WEIGHT BEARING RESTRICTIONS: No  FALLS:  Has patient fallen in last 6 months? No  LIVING ENVIRONMENT: Lives with: lives with their spouse Lives in: House Stairs: Yes: Internal: 17 steps; on right going up, on left going up, and can reach both and External: 2 steps; on right going up, on left going up, and can  reach both Has following equipment at home: Single point cane, Walker - 2 wheeled, shower chair, and Grab bars  OCCUPATION: Retired  PLOF: Independent and Leisure: pickle ball 3-4x/wk, walking 3x/wk, yard work  PATIENT GOALS: To feel confident in my walking - feel steady.   OBJECTIVE: (objective measures completed at initial evaluation unless otherwise dated)  DIAGNOSTIC FINDINGS:  01/10/24 - MRI LUMBAR SPINE WITHOUT CONTRAST FINDINGS: Segmentation: 5 lumbar type non-rib-bearing vertebral bodies are presumed. The lowest well-formed disc space is labeled L5-S1.   Alignment: Straightening and slight reversal of the normal lumbar lordosis. Retrolisthesis of L2 on L3 and L3 on L4 and L4 on L5 similar to prior.   Vertebrae: Vertebral body heights are maintained. No bone marrow edema or evidence of fracture. Heterogeneous bone marrow signal intensity. Hemangiomas at multiple levels.   Conus medullaris and cauda equina: Conus extends to the L1 level. Conus and cauda equina appear normal.   Paraspinal and other soft tissues: The paraspinal soft tissues are unremarkable.   Disc levels:   T12-L1: Small disc bulge. No significant spinal canal or foraminal stenosis.   L1-2: No significant disc bulge. No significant spinal canal stenosis or foraminal stenosis.   L2-3: Mild disc height loss. Diffuse disc bulge eccentric to the left. Bilateral facet arthrosis indents the dorsal thecal sac. Lateral recess narrowing greater on the left. Moderate spinal canal stenosis, slightly increased from prior. Mild foraminal stenosis on the right.   L3-4: Moderate disc height loss. Disc bulge and posterior osteophytes resulting in lateral recess narrowing. Mild facet arthrosis and thickening of the ligamentum flavum indents the dorsal thecal sac. There is moderate spinal canal stenosis, similar to prior. Mild bilateral foraminal stenosis. Lateral component of disc bulge likely contacts the  extraforaminal right L3 nerve root.   L4-5: Moderate disc height loss. Diffuse disc bulge and central disc protrusion. Moderate facet arthrosis and thickening of the ligamentum flavum indents the dorsal thecal sac. Lateral recess narrowing and severe spinal canal stenosis. There is mild right and severe left foraminal stenosis.   L5-S1: Moderate disc height loss. Diffuse disc bulge and central disc protrusion resulting in lateral recess narrowing. Mild facet arthrosis. Mild spinal canal stenosis. There is moderate bilateral foraminal stenosis.   IMPRESSION: Degenerative changes as above. Disc bulge and facet arthrosis at L4-5 resulting in severe spinal canal stenosis and lateral recess narrowing, increased from prior. Additional moderate spinal canal stenosis at L2-3 is slightly increased from prior.   Multilevel foraminal stenosis, greatest and severe on the left at L4-5. Additional moderate foraminal stenosis bilaterally  at L5-S1.   Lateral component of disc bulge at L3-4 likely contacts the extraforaminal portion of the right L3 nerve root.  PATIENT SURVEYS:  ABC scale: The Activities-Specific Balance Confidence (ABC) Scale 0% 10 20 30  40 50 60 70 80 90 100% No confidence<->completely confident  "How confident are you that you will not lose your balance or become unsteady when you . . .  Date tested 04/29/2024   Walk around the house 80%  2. Walk up or down stairs 60%  3. Bend over and pick up a slipper from in front of a closet floor 50%  4. Reach for a small can off a shelf at eye level 60%  5. Stand on tip toes and reach for something above your head 50%  6. Stand on a chair and reach for something 30%  7. Sweep the floor 90%  8. Walk outside the house to a car parked in the driveway 90%  9. Get into or out of a car 90%  10. Walk across a parking lot to the mall 50%  11. Walk up or down a ramp 50%  12. Walk in a crowded mall where people rapidly walk past you 60%  13.  Are bumped into by people as you walk through the mall 60%  14. Step onto or off of an escalator while you are holding onto the railing 50%  15. Step onto or off an escalator while holding onto parcels such that you cannot hold onto the railing 40%  16. Walk outside on icy sidewalks 10%  Total: #/16 920 / 1600 = 57.5 %  Level of Physical Functioning:  Moderate    Modified Oswestry:  MODIFIED OSWESTRY DISABILITY SCALE  Date:  04/29/2024   Pain intensity 3 =  Pain medication provides me with moderate relief from pain.  2. Personal care (washing, dressing, etc.) 0 =  I can take care of myself normally without causing increased pain.  3. Lifting 4 = I can lift only very light weights  4. Walking 2 =  Pain prevents me from walking more than  mile.  5. Sitting 2 =  Pain prevents me from sitting more than 1 hour.  6. Standing 5 =  Pain prevents me from standing at all  7. Sleeping 1 = I can sleep well only by using pain medication.  8. Social Life 2 = Pain prevents me from participating in more energetic activities (eg. sports, dancing).  9. Traveling 1 =  I can travel anywhere, but it increases my pain.  10. Employment/ Homemaking 1 = My normal homemaking/job activities increase my pain, but I can still perform all that is required of me  Total 22/50  % Disability 44.0 % - Severe   Interpretation of scores: Score Category Description  0-20% Minimal Disability The patient can cope with most living activities. Usually no treatment is indicated apart from advice on lifting, sitting and exercise  21-40% Moderate Disability The patient experiences more pain and difficulty with sitting, lifting and standing. Travel and social life are more difficult and they may be disabled from work. Personal care, sexual activity and sleeping are not grossly affected, and the patient can usually be managed by conservative means  41-60% Severe Disability Pain remains the main problem in this group, but activities of  daily living are affected. These patients require a detailed investigation  61-80% Crippled Back pain impinges on all aspects of the patient's life. Positive intervention is required  81-100% Bed-bound  These patients are either bed-bound or exaggerating their symptoms  Bluford FORBES Zoe DELENA Karon DELENA, et al. Surgery versus conservative management of stable thoracolumbar fracture: the PRESTO feasibility RCT. [place] ([place]): [employer]; 2021 Nov. [facility] Technology Assessment, No. 25.62.) Appendix 3, Oswestry Disability Index category descriptors. Available from: Findjewelers.cz  Minimally Clinically Important Difference (MCID) = 12.8%  SCREENING FOR RED FLAGS: Bowel or bladder incontinence: No Spinal tumors: No Cauda equina syndrome: No Compression fracture: No Abdominal aneurysm: No  COGNITION:  Overall cognitive status: Within functional limits for tasks assessed    SENSATION: WFL  POSTURE:  rounded shoulders, forward head, flexed trunk , and weight shift left  LUMBAR ROM:   Active  Eval  Flexion WNL  Extension 25% limited  Right lateral flexion WFL  Left lateral flexion WFL  Right rotation WFL  Left rotation WFL  (Blank rows = not tested)  MUSCLE LENGTH: Hamstrings: mild tight/WFL B ITB: mild tight/WFL B Piriformis: mod/severe tight B Hip flexors: mild tight B Quads:  Heelcord:   LOWER EXTREMITY ROM:    Limited in B hip IR/ER, otherwise grossly WFL  LOWER EXTREMITY MMT:    MMT Right eval Left eval  Hip flexion 4- 4- p! (Buttock)  Hip extension 3+ 3-  Hip abduction 4- 4-  Hip adduction 3- 3+  Hip internal rotation 4- 4- p! (Buttock)  Hip external rotation 4-  (Limited ROM) 4- p! (Limited ROM)  Knee flexion 4- 4  Knee extension 4+ 4+  Ankle dorsiflexion 3+ 4-  Ankle plantarflexion 3 3-  Ankle inversion    Ankle eversion     (Blank rows = not tested)   FUNCTIONAL TESTS:  5 times sit to stand: 24.25 sec Timed up  and go (TUG): 15.38 sec 10 meter walk test: 13.59 sec Gait speed: 2.41 ft/sec Functional gait assessment: 11/30, < 19 = high risk fall   Functional Gait  Assessment  Gait Level Surface Walks 20 ft, slow speed, abnormal gait pattern, evidence for imbalance or deviates 10-15 in outside of the 12 in walkway width. Requires more than 7 sec to ambulate 20 ft.   Change in Gait Speed Able to change speed, demonstrates mild gait deviations, deviates 6-10 in outside of the 12 in walkway width, or no gait deviations, unable to achieve a major change in velocity, or uses a change in velocity, or uses an assistive device.   Gait with Horizontal Head Turns Performs head turns with moderate changes in gait velocity, slows down, deviates 10-15 in outside 12 in walkway width but recovers, can continue to walk.   Gait with Vertical Head Turns Performs task with slight change in gait velocity (eg, minor disruption to smooth gait path), deviates 6 - 10 in outside 12 in walkway width or uses assistive device   Gait and Pivot Turn Pivot turns safely in greater than 3 sec and stops with no loss of balance, or pivot turns safely within 3 sec and stops with mild imbalance, requires small steps to catch balance.   Step Over Obstacle Is able to step over one shoe box (4.5 in total height) but must slow down and adjust steps to clear box safely. May require verbal cueing.   Gait with Narrow Base of Support Ambulates less than 4 steps heel to toe or cannot perform without assistance.   Gait with Eyes Closed Cannot walk 20 ft without assistance, severe gait deviations or imbalance, deviates greater than 15 in outside 12 in walkway width or will  not attempt task.   Ambulating Backwards Walks 20 ft, slow speed, abnormal gait pattern, evidence for imbalance, deviates 10-15 in outside 12 in walkway width.   Steps Two feet to a stair, must use rail.   Total Score 11   FGA comment: < 19 = high risk fall      Interpretation of  scores: Non-Specific Older Adults Cutoff Score: <=22/30 = risk of falls Parkinson's Disease Cutoff score <15/30= fall risk (Hoehn & Yahr 1-4)  Minimally Clinically Important Difference (MCID)  Stroke (acute, subacute, and chronic) = MDC: 4.2 points Vestibular (acute) = MDC: 6 points Community Dwelling Older Adults =  MCID: 4 points Parkinson's Disease  =  MDC: 4.3 points  (Academy of Neu[employer]ic Physical Therapy (nd). Functional Gait Assessment. Retrieved from https://www.neuropt.org/docs/default-source/cpgs/core-outcome-measures/function-gait-assessment-pocket-guide-proof9-(2).pdf?sfvrsn=b2f35043_0.)  GAIT: Distance walked: clinic distances Assistive device utilized: None Level of assistance: Complete Independence and SBA Gait pattern: step through pattern, decreased stride length, decreased hip/knee flexion- Right, decreased hip/knee flexion- Left, lateral lean- Left, and trunk flexed Comments: decreased gait speed   TODAY'S TREATMENT:   04/29/2024 - Eval  SELF CARE:  Reviewed eval findings and role of PT in addressing identified deficits as well as instruction in initial HEP (see below).    PATIENT EDUCATION:  Education details: PT eval findings, anticipated POC, need for further assessment of static standing balance with Lars, standardized testing results and interpretation, and initial HEP  Person educated: Patient Education method: Explanation, Demonstration, Verbal cues, and Handouts Education comprehension: verbalized understanding, returned demonstration, verbal cues required, and needs further education  HOME EXERCISE PROGRAM: Access Code: [email or web address] URL: [email or web address] Date: 04/29/2024 Prepared by: Elijah Hidden  Exercises - Supine Figure 4 Piriformis Stretch  - 2 x daily - 7 x weekly - 3 reps - 30 sec hold - Supine Piriformis Stretch with Foot on Ground  - 2 x daily - 7 x weekly - 3 reps - 30 sec hold   ASSESSMENT:  CLINICAL  IMPRESSION: SHANNAN SLINKER is a 83 y.o. female who was referred to physical therapy for evaluation and treatment for lumbar spinal stenosis s/p B L4-5 laminectomies and foraminectomies/partial facetectomies.  She reports her pain has been well-controlled since surgery but feels like her legs have gotten weak and her balance is more impaired, causing her to have to be very careful not to fall and preventing her from participating in her PLOF activities including playing pickle ball and walking in her neighborhood.  Patient has deficits in lumbar extension and B hip rotation ROM, proximal B LE flexibility, B LE strength, abnormal posture, and impaired balance which are interfering with ADLs and are impacting quality of life.  On Modified Oswestry patient scored 22/50 demonstrating 44% or severe disability.  Standardized balance testing revealed patient is at risk for falls and functional decline as evidenced by the following objective test measures: Gait speed 2.41 ft/sec, (2.62 ft/sec is needed for community access), TUG of 15.38 sec (>13.5 sec indicates increased risk for falls), and 5xSTS of 24.25 sec (>15 sec indicates increased risk for falls and decreased BLE power). ABC scale score of 57.5% indicates a moderate level of physical functioning.  Jermeka Schlotterbeck will benefit from skilled PT to address above deficits to improve mobility and activity tolerance to help reach the maximal level of functional independence and mobility with decreased pain interference. Patient demonstrates understanding of this POC and is in agreement with this plan.   OBJECTIVE IMPAIRMENTS: Abnormal gait, decreased activity tolerance, decreased balance, decreased endurance, decreased knowledge of condition,  decreased mobility, difficulty walking, decreased ROM, decreased strength, decreased safety awareness, increased fascial restrictions, impaired perceived functional ability, increased muscle spasms, impaired flexibility, improper  body mechanics, postural dysfunction, and pain.   ACTIVITY LIMITATIONS: carrying, lifting, bending, sitting, standing, squatting, sleeping, stairs, transfers, bed mobility, and locomotion level  PARTICIPATION LIMITATIONS: meal prep, cleaning, laundry, interpersonal relationship, shopping, community activity, and yard work  PERSONAL FACTORS: Age, Past/current experiences, Time since onset of injury/illness/exacerbation, and 3+ comorbidities: B L4-5 laminectomies & foraminectomies/partial facetectomies 02/19/24; L THA 11/2022, R THA 2013, L carpal tunnel release 11/2023, osteoporosis, GERD, macular degeneration, HLD, insomnia are also affecting patient's functional outcome.   REHAB POTENTIAL: Good  CLINICAL DECISION MAKING: Unstable/unpredictable  EVALUATION COMPLEXITY: High   GOALS: Goals reviewed with patient? Yes  SHORT TERM GOALS: Target date: 05/27/2024  Patient will be independent with initial HEP to improve outcomes and carryover.  Baseline: HEP initiated on eval Goal status: INITIAL  2.  Patient will improve 5x STS time to </= 20 seconds to demonstrate improved functional strength and transfer efficiency. Baseline: 24.25 sec Goal status: INITIAL  3.  Patient will demonstrate decreased TUG time to </= 13.5 sec to decrease risk for falls with transitional mobility Baseline: 15.38 sec Goal status: INITIAL   LONG TERM GOALS: Target date: 06/24/2024  Patient will be independent with ongoing/advanced HEP for self-management at home.  Baseline:  Goal status: INITIAL  2.  Patient to demonstrate ability to achieve and maintain good spinal alignment/posturing and body mechanics needed for daily activities. Baseline:  Goal status: INITIAL  3.  Patient will demonstrate improved B LE strength to >/= 4 to 4+/5 for improved stability and ease of mobility. Baseline: Refer to above LE MMT table Goal status: INITIAL  4. Patient will report </= 32% on Modified Oswestry (MCID = 12%) to  demonstrate improved functional ability with decreased pain interference. Baseline: 22 / 50 = 44.0 % Goal status: INITIAL  5.  Patient will report >/= 70% on ABC scale to demonstrate improved balance confidence and decreased risk for falls. Baseline: 920 / 1600 = 57.5 % Goal status: INITIAL  6.  Patient will be able to resume walking for exercise at least 3x/week without limitation due to LBP or impaired balance.  Baseline:  Goal status: INITIAL   7.  Patient will report ability to resume playing pickle ball without limitation due to low back pain or impaired balance.  Baseline:  Goal status: INITIAL   PLAN:  PT FREQUENCY: 1-2x/week  PT DURATION: 8 weeks  PLANNED INTERVENTIONS: 02835- PT Re-evaluation, 97750- Physical Performance Testing, 97110-Therapeutic exercises, 97530- Therapeutic activity, 97112- Neuromuscular re-education, 97535- Self Care, 02859- Manual therapy, G0283- Electrical stimulation (unattended), [phone]- Ionotophoresis 4mg /ml Dexamethasone , Patient/Family education, Balance training, Stair training, Taping, Joint mobilization, Spinal mobilization, Cryotherapy, and Moist heat  PLAN FOR NEXT SESSION: Complete Berg balance test; review initial HEP and progress lumbopelvic and proximal LE strengthening, updating HEP accordingly   Elijah CHRISTELLA Hidden, PT 04/29/2024, 3:41 PM

## 2024-04-29 ENCOUNTER — Other Ambulatory Visit: Payer: Self-pay

## 2024-04-29 ENCOUNTER — Ambulatory Visit: Attending: Neurosurgery | Admitting: Physical Therapy

## 2024-04-29 ENCOUNTER — Encounter: Payer: Self-pay | Admitting: Physical Therapy

## 2024-04-29 DIAGNOSIS — R2681 Unsteadiness on feet: Secondary | ICD-10-CM | POA: Diagnosis not present

## 2024-04-29 DIAGNOSIS — M5459 Other low back pain: Secondary | ICD-10-CM | POA: Insufficient documentation

## 2024-04-29 DIAGNOSIS — M6281 Muscle weakness (generalized): Secondary | ICD-10-CM | POA: Insufficient documentation

## 2024-05-03 ENCOUNTER — Ambulatory Visit

## 2024-05-03 ENCOUNTER — Ambulatory Visit: Admitting: Family Medicine

## 2024-05-03 DIAGNOSIS — M6281 Muscle weakness (generalized): Secondary | ICD-10-CM

## 2024-05-03 DIAGNOSIS — R2681 Unsteadiness on feet: Secondary | ICD-10-CM

## 2024-05-03 DIAGNOSIS — M5459 Other low back pain: Secondary | ICD-10-CM

## 2024-05-03 NOTE — Therapy (Signed)
 OUTPATIENT PHYSICAL THERAPY THORACOLUMBAR TREATMENT   Patient Name: Joanna Williams MRN: [id number] DOB:June 06, 1941, 83 y.o., female Today's Date: 05/03/2024  END OF SESSION:  PT End of Session - 05/03/24 0809     Visit Number 2    Date for Recertification  06/24/24    Authorization Type [employer]    Authorization Time Period auth pending    PT Start Time 0803    PT Stop Time 0848    PT Time Calculation (min) 45 min    Activity Tolerance Patient tolerated treatment well    Behavior During Therapy [facility] for tasks assessed/performed           Past Medical History:  Diagnosis Date   GERD (gastroesophageal reflux disease)    Heart murmur    Hyperlipidemia    Macular degeneration 05/04/2017   Past Surgical History:  Procedure Laterality Date   CARPAL TUNNEL RELEASE Left 11/2023   gramig   CATARACT EXTRACTION Bilateral 2014   COLONOSCOPY  2 yrs ago   LUMBAR LAMINECTOMY/DECOMPRESSION MICRODISCECTOMY Bilateral 02/19/2024   Procedure: LUMBAR LAMINECTOMY/DECOMPRESSION MICRODISCECTOMY 1 LEVEL;  Surgeon: Onetha Kuba, MD;  Location: [facility] OR;  Service: Neurosurgery;  Laterality: Bilateral;  Laminectomy and Foraminotomy - L4-L5 - bilateral   TOTAL HIP ARTHROPLASTY  08/10/2011   Procedure: TOTAL HIP ARTHROPLASTY;  Surgeon: Dempsey JINNY Sensor, MD;  Location: [facility] OR;  Service: Orthopedics;  Laterality: Right;  DEPUY/ PENNACLE POLY OR CERAMIC   TOTAL HIP ARTHROPLASTY Left 11/08/2022   Procedure: TOTAL HIP ARTHROPLASTY;  Surgeon: Josefina Chew, MD;  Location: [facility] ORS;  Service: Orthopedics;  Laterality: Left;   Patient Active Problem List   Diagnosis Date Noted   Spinal stenosis of lumbar region 02/19/2024   Left hip pain 09/26/2022   Insomnia 09/26/2022   Pre-op examination 09/26/2022   Preventative health care 07/18/2021   Age-related osteoporosis without current pathological fracture 06/04/2019   Murmur 06/04/2019   Tongue lesion 06/04/2019   Low back pain with radiation 06/04/2019    Right lower quadrant abdominal mass 01/24/2019   Carpal tunnel syndrome on both sides 11/06/2017   UTI (urinary tract infection) 05/12/2014   Sciatica 02/04/2014   GERD (gastroesophageal reflux disease) 08/13/2012   Hypothyroidism 06/15/2010   Benign neoplasm of skin 11/27/2009   Hyperlipidemia 02/13/2007    PCP: Antonio Cyndee Jamee JONELLE, DO   REFERRING PROVIDER: Onetha Kuba, MD   REFERRING DIAG: [phone] (ICD-10-CM) - Spinal stenosis, lumbar region with neurogenic claudication   THERAPY DIAG:  Muscle weakness (generalized)  Unsteadiness on feet  Other low back pain  RATIONALE FOR EVALUATION AND TREATMENT: Rehabilitation  ONSET DATE: 02/19/24 - Bilateral decompressive lumbar laminectomy L4-5 with foraminotomies the L4-L5 nerve roots bilaterally partial facetectomies.   NEXT MD VISIT:  None scheduled   SUBJECTIVE:  SUBJECTIVE STATEMENT: Pt reports her legs especially L leg feels week ever since surgery  Pt reports ever since her back surgery in August, she states her legs feel weak and her balance is off.  She has to be very careful not to fall, but has not had any falls.  Not able to be active like she previously was (playing pickle ball).  She denies any pain, numbness or tingling.  PAIN: Are you having pain? No  PERTINENT HISTORY:  B L4-5 laminectomies & foraminectomies/partial facetectomies 02/19/24; L THA 11/2022, R THA 2013, L carpal tunnel release 11/2023, osteoporosis, GERD, macular degeneration, HLD, insomnia  PRECAUTIONS: None  RED FLAGS: None  WEIGHT BEARING RESTRICTIONS: No  FALLS:  Has patient fallen in last 6 months? No  LIVING ENVIRONMENT: Lives with: lives with their spouse Lives in: House Stairs: Yes: Internal: 17 steps; on right going up, on left going  up, and can reach both and External: 2 steps; on right going up, on left going up, and can reach both Has following equipment at home: Single point cane, Walker - 2 wheeled, shower chair, and Grab bars  OCCUPATION: Retired  PLOF: Independent and Leisure: pickle ball 3-4x/wk, walking 3x/wk, yard work  PATIENT GOALS: To feel confident in my walking - feel steady.   OBJECTIVE: (objective measures completed at initial evaluation unless otherwise dated)  DIAGNOSTIC FINDINGS:  01/10/24 - MRI LUMBAR SPINE WITHOUT CONTRAST FINDINGS: Segmentation: 5 lumbar type non-rib-bearing vertebral bodies are presumed. The lowest well-formed disc space is labeled L5-S1.   Alignment: Straightening and slight reversal of the normal lumbar lordosis. Retrolisthesis of L2 on L3 and L3 on L4 and L4 on L5 similar to prior.   Vertebrae: Vertebral body heights are maintained. No bone marrow edema or evidence of fracture. Heterogeneous bone marrow signal intensity. Hemangiomas at multiple levels.   Conus medullaris and cauda equina: Conus extends to the L1 level. Conus and cauda equina appear normal.   Paraspinal and other soft tissues: The paraspinal soft tissues are unremarkable.   Disc levels:   T12-L1: Small disc bulge. No significant spinal canal or foraminal stenosis.   L1-2: No significant disc bulge. No significant spinal canal stenosis or foraminal stenosis.   L2-3: Mild disc height loss. Diffuse disc bulge eccentric to the left. Bilateral facet arthrosis indents the dorsal thecal sac. Lateral recess narrowing greater on the left. Moderate spinal canal stenosis, slightly increased from prior. Mild foraminal stenosis on the right.   L3-4: Moderate disc height loss. Disc bulge and posterior osteophytes resulting in lateral recess narrowing. Mild facet arthrosis and thickening of the ligamentum flavum indents the dorsal thecal sac. There is moderate spinal canal stenosis, similar to prior.  Mild bilateral foraminal stenosis. Lateral component of disc bulge likely contacts the extraforaminal right L3 nerve root.   L4-5: Moderate disc height loss. Diffuse disc bulge and central disc protrusion. Moderate facet arthrosis and thickening of the ligamentum flavum indents the dorsal thecal sac. Lateral recess narrowing and severe spinal canal stenosis. There is mild right and severe left foraminal stenosis.   L5-S1: Moderate disc height loss. Diffuse disc bulge and central disc protrusion resulting in lateral recess narrowing. Mild facet arthrosis. Mild spinal canal stenosis. There is moderate bilateral foraminal stenosis.   IMPRESSION: Degenerative changes as above. Disc bulge and facet arthrosis at L4-5 resulting in severe spinal canal stenosis and lateral recess narrowing, increased from prior. Additional moderate spinal canal stenosis at L2-3 is slightly increased from prior.   Multilevel foraminal stenosis,  greatest and severe on the left at L4-5. Additional moderate foraminal stenosis bilaterally at L5-S1.   Lateral component of disc bulge at L3-4 likely contacts the extraforaminal portion of the right L3 nerve root.  PATIENT SURVEYS:  ABC scale: The Activities-Specific Balance Confidence (ABC) Scale 0% 10 20 30  40 50 60 70 80 90 100% No confidence<->completely confident  "How confident are you that you will not lose your balance or become unsteady when you . . .  Date tested 04/29/2024   Walk around the house 80%  2. Walk up or down stairs 60%  3. Bend over and pick up a slipper from in front of a closet floor 50%  4. Reach for a small can off a shelf at eye level 60%  5. Stand on tip toes and reach for something above your head 50%  6. Stand on a chair and reach for something 30%  7. Sweep the floor 90%  8. Walk outside the house to a car parked in the driveway 90%  9. Get into or out of a car 90%  10. Walk across a parking lot to the mall 50%  11. Walk up or  down a ramp 50%  12. Walk in a crowded mall where people rapidly walk past you 60%  13. Are bumped into by people as you walk through the mall 60%  14. Step onto or off of an escalator while you are holding onto the railing 50%  15. Step onto or off an escalator while holding onto parcels such that you cannot hold onto the railing 40%  16. Walk outside on icy sidewalks 10%  Total: #/16 920 / 1600 = 57.5 %  Level of Physical Functioning:  Moderate    Modified Oswestry:  MODIFIED OSWESTRY DISABILITY SCALE  Date:  04/29/2024   Pain intensity 3 =  Pain medication provides me with moderate relief from pain.  2. Personal care (washing, dressing, etc.) 0 =  I can take care of myself normally without causing increased pain.  3. Lifting 4 = I can lift only very light weights  4. Walking 2 =  Pain prevents me from walking more than  mile.  5. Sitting 2 =  Pain prevents me from sitting more than 1 hour.  6. Standing 5 =  Pain prevents me from standing at all  7. Sleeping 1 = I can sleep well only by using pain medication.  8. Social Life 2 = Pain prevents me from participating in more energetic activities (eg. sports, dancing).  9. Traveling 1 =  I can travel anywhere, but it increases my pain.  10. Employment/ Homemaking 1 = My normal homemaking/job activities increase my pain, but I can still perform all that is required of me  Total 22/50  % Disability 44.0 % - Severe   Interpretation of scores: Score Category Description  0-20% Minimal Disability The patient can cope with most living activities. Usually no treatment is indicated apart from advice on lifting, sitting and exercise  21-40% Moderate Disability The patient experiences more pain and difficulty with sitting, lifting and standing. Travel and social life are more difficult and they may be disabled from work. Personal care, sexual activity and sleeping are not grossly affected, and the patient can usually be managed by conservative means   41-60% Severe Disability Pain remains the main problem in this group, but activities of daily living are affected. These patients require a detailed investigation  61-80% Crippled Back pain impinges on  all aspects of the patient's life. Positive intervention is required  81-100% Bed-bound These patients are either bed-bound or exaggerating their symptoms  Bluford FORBES Zoe DELENA Karon DELENA, et al. Surgery versus conservative management of stable thoracolumbar fracture: the PRESTO feasibility RCT. [place] ([place]): [employer]; 2021 Nov. [facility] Technology Assessment, No. 25.62.) Appendix 3, Oswestry Disability Index category descriptors. Available from: Findjewelers.cz  Minimally Clinically Important Difference (MCID) = 12.8%  SCREENING FOR RED FLAGS: Bowel or bladder incontinence: No Spinal tumors: No Cauda equina syndrome: No Compression fracture: No Abdominal aneurysm: No  COGNITION:  Overall cognitive status: Within functional limits for tasks assessed    SENSATION: WFL  POSTURE:  rounded shoulders, forward head, flexed trunk , and weight shift left  LUMBAR ROM:   Active  Eval  Flexion WNL  Extension 25% limited  Right lateral flexion WFL  Left lateral flexion WFL  Right rotation WFL  Left rotation WFL  (Blank rows = not tested)  MUSCLE LENGTH: Hamstrings: mild tight/WFL B ITB: mild tight/WFL B Piriformis: mod/severe tight B Hip flexors: mild tight B Quads:  Heelcord:   LOWER EXTREMITY ROM:    Limited in B hip IR/ER, otherwise grossly WFL  LOWER EXTREMITY MMT:    MMT Right eval Left eval  Hip flexion 4- 4- p! (Buttock)  Hip extension 3+ 3-  Hip abduction 4- 4-  Hip adduction 3- 3+  Hip internal rotation 4- 4- p! (Buttock)  Hip external rotation 4-  (Limited ROM) 4- p! (Limited ROM)  Knee flexion 4- 4  Knee extension 4+ 4+  Ankle dorsiflexion 3+ 4-  Ankle plantarflexion 3 3-  Ankle inversion    Ankle eversion      (Blank rows = not tested)   FUNCTIONAL TESTS:  5 times sit to stand: 24.25 sec Timed up and go (TUG): 15.38 sec 10 meter walk test: 13.59 sec Gait speed: 2.41 ft/sec Functional gait assessment: 11/30, < 19 = high risk fall   Functional Gait  Assessment  Gait Level Surface Walks 20 ft, slow speed, abnormal gait pattern, evidence for imbalance or deviates 10-15 in outside of the 12 in walkway width. Requires more than 7 sec to ambulate 20 ft.   Change in Gait Speed Able to change speed, demonstrates mild gait deviations, deviates 6-10 in outside of the 12 in walkway width, or no gait deviations, unable to achieve a major change in velocity, or uses a change in velocity, or uses an assistive device.   Gait with Horizontal Head Turns Performs head turns with moderate changes in gait velocity, slows down, deviates 10-15 in outside 12 in walkway width but recovers, can continue to walk.   Gait with Vertical Head Turns Performs task with slight change in gait velocity (eg, minor disruption to smooth gait path), deviates 6 - 10 in outside 12 in walkway width or uses assistive device   Gait and Pivot Turn Pivot turns safely in greater than 3 sec and stops with no loss of balance, or pivot turns safely within 3 sec and stops with mild imbalance, requires small steps to catch balance.   Step Over Obstacle Is able to step over one shoe box (4.5 in total height) but must slow down and adjust steps to clear box safely. May require verbal cueing.   Gait with Narrow Base of Support Ambulates less than 4 steps heel to toe or cannot perform without assistance.   Gait with Eyes Closed Cannot walk 20 ft without assistance, severe gait deviations or  imbalance, deviates greater than 15 in outside 12 in walkway width or will not attempt task.   Ambulating Backwards Walks 20 ft, slow speed, abnormal gait pattern, evidence for imbalance, deviates 10-15 in outside 12 in walkway width.   Steps Two feet to a stair, must use  rail.   Total Score 11   FGA comment: < 19 = high risk fall      Interpretation of scores: Non-Specific Older Adults Cutoff Score: <=22/30 = risk of falls Parkinson's Disease Cutoff score <15/30= fall risk (Hoehn & Yahr 1-4)  Minimally Clinically Important Difference (MCID)  Stroke (acute, subacute, and chronic) = MDC: 4.2 points Vestibular (acute) = MDC: 6 points Community Dwelling Older Adults =  MCID: 4 points Parkinson's Disease  =  MDC: 4.3 points  (Academy of Neu[employer]ic Physical Therapy (nd). Functional Gait Assessment. Retrieved from https://www.neuropt.org/docs/default-source/cpgs/core-outcome-measures/function-gait-assessment-pocket-guide-proof9-(2).pdf?sfvrsn=b70f35043_0.)  GAIT: Distance walked: clinic distances Assistive device utilized: None Level of assistance: Complete Independence and SBA Gait pattern: step through pattern, decreased stride length, decreased hip/knee flexion- Right, decreased hip/knee flexion- Left, lateral lean- Left, and trunk flexed Comments: decreased gait speed   TODAY'S TREATMENT:  05/03/24 NEUROMUSCULAR RE-EDUCATION: To improve coordination, kinesthesia, posture, and proprioception.  Hooklying  Bent knee fallouts RTB with TrA 10x3' Marching RTB with TrA 10x3' Bridges RTB with TRA x 10 R sidelying clamshells x 10--- uncomfortable lying on L side Standing at counter top Heel/toe raises x 10 Mini squats x 10   [facility] PT Assessment - 05/03/24 0001       Standardized Balance Assessment   Standardized Balance Assessment Berg Balance Test      Berg Balance Test   Sit to Stand Able to stand without using hands and stabilize independently    Standing Unsupported Able to stand safely 2 minutes    Sitting with Back Unsupported but Feet Supported on Floor or Stool Able to sit safely and securely 2 minutes    Stand to Sit Sits safely with minimal use of hands    Transfers Able to transfer safely, minor use of hands    Standing Unsupported with  Eyes Closed Able to stand 10 seconds safely    Standing Unsupported with Feet Together Able to place feet together independently and stand 1 minute safely    From Standing, Reach Forward with Outstretched Arm Can reach confidently >25 cm (10)    From Standing Position, Pick up Object from Floor Able to pick up shoe safely and easily    From Standing Position, Turn to Look Behind Over each Shoulder Looks behind one side only/other side shows less weight shift    Turn 360 Degrees Able to turn 360 degrees safely in 4 seconds or less    Standing Unsupported, Alternately Place Feet on Step/Stool Able to stand independently and complete 8 steps >20 seconds    Standing Unsupported, One Foot in Front Able to plae foot ahead of the other independently and hold 30 seconds    Standing on One Leg Tries to lift leg/unable to hold 3 seconds but remains standing independently    Total Score 50          THERAPEUTIC EXERCISE: To improve strength, endurance, ROM, and flexibility.  Bike x 5 min no resistance Reviewed HEP - figure 4 and KTOS stretches   04/29/2024 - Eval  SELF CARE:  Reviewed eval findings and role of PT in addressing identified deficits as well as instruction in initial HEP (see below).    PATIENT EDUCATION:  Education details: standardized  testing results and interpretation and HEP update - supine strengthening and standing exercises  Person educated: Patient Education method: Explanation, Demonstration, Verbal cues, and Handouts Education comprehension: verbalized understanding, returned demonstration, verbal cues required, and needs further education  HOME EXERCISE PROGRAM: Access Code: [email or web address] URL: [email or web address] Date: 05/03/2024 Prepared by: Shatisha Falter  Exercises - Supine Figure 4 Piriformis Stretch  - 2 x daily - 7 x weekly - 3 reps - 30 sec hold - Supine Piriformis Stretch with Foot on Ground  - 2 x daily - 7 x weekly - 3 reps - 30 sec hold -  Supine March with Resistance Band  - 1 x daily - 7 x weekly - 2 sets - 10 reps - 3-5 sec hold - Hooklying Clamshell with Resistance  - 1 x daily - 7 x weekly - 2 sets - 10 reps - 3-5 sec hold - Supine Bridge with Resistance Band  - 1 x daily - 7 x weekly - 2 sets - 10 reps - 3-5 sec hold - Mini Squat with Counter Support  - 1 x daily - 7 x weekly - 2 sets - 10 reps - 3-5 sec hold   ASSESSMENT:  CLINICAL IMPRESSION: BERG balance test completed, pt scored 50/56 which puts her in low fall risk category. Initiated strengthening for BLE, with good response. She does show more pronounced weakness in LLE vs RLE, especially with clamshells. Will continue to progress strengthening as able/tolerated.     Joanna Williams is a 83 y.o. female who was referred to physical therapy for evaluation and treatment for lumbar spinal stenosis s/p B L4-5 laminectomies and foraminectomies/partial facetectomies.  She reports her pain has been well-controlled since surgery but feels like her legs have gotten weak and her balance is more impaired, causing her to have to be very careful not to fall and preventing her from participating in her PLOF activities including playing pickle ball and walking in her neighborhood.  Patient has deficits in lumbar extension and B hip rotation ROM, proximal B LE flexibility, B LE strength, abnormal posture, and impaired balance which are interfering with ADLs and are impacting quality of life.  On Modified Oswestry patient scored 22/50 demonstrating 44% or severe disability.  Standardized balance testing revealed patient is at risk for falls and functional decline as evidenced by the following objective test measures: Gait speed 2.41 ft/sec, (2.62 ft/sec is needed for community access), TUG of 15.38 sec (>13.5 sec indicates increased risk for falls), and 5xSTS of 24.25 sec (>15 sec indicates increased risk for falls and decreased BLE power). ABC scale score of 57.5% indicates a moderate  level of physical functioning.  Joanna Williams will benefit from skilled PT to address above deficits to improve mobility and activity tolerance to help reach the maximal level of functional independence and mobility with decreased pain interference. Patient demonstrates understanding of this POC and is in agreement with this plan.   OBJECTIVE IMPAIRMENTS: Abnormal gait, decreased activity tolerance, decreased balance, decreased endurance, decreased knowledge of condition, decreased mobility, difficulty walking, decreased ROM, decreased strength, decreased safety awareness, increased fascial restrictions, impaired perceived functional ability, increased muscle spasms, impaired flexibility, improper body mechanics, postural dysfunction, and pain.   ACTIVITY LIMITATIONS: carrying, lifting, bending, sitting, standing, squatting, sleeping, stairs, transfers, bed mobility, and locomotion level  PARTICIPATION LIMITATIONS: meal prep, cleaning, laundry, interpersonal relationship, shopping, community activity, and yard work  PERSONAL FACTORS: Age, Past/current experiences, Time since onset of injury/illness/exacerbation, and 3+ comorbidities: B L4-5 laminectomies & foraminectomies/partial  facetectomies 02/19/24; L THA 11/2022, R THA 2013, L carpal tunnel release 11/2023, osteoporosis, GERD, macular degeneration, HLD, insomnia are also affecting patient's functional outcome.   REHAB POTENTIAL: Good  CLINICAL DECISION MAKING: Unstable/unpredictable  EVALUATION COMPLEXITY: High   GOALS: Goals reviewed with patient? Yes  SHORT TERM GOALS: Target date: 05/27/2024  Patient will be independent with initial HEP to improve outcomes and carryover.  Baseline: HEP initiated on eval Goal status: INITIAL  2.  Patient will improve 5x STS time to </= 20 seconds to demonstrate improved functional strength and transfer efficiency. Baseline: 24.25 sec Goal status: INITIAL  3.  Patient will demonstrate decreased TUG  time to </= 13.5 sec to decrease risk for falls with transitional mobility Baseline: 15.38 sec Goal status: INITIAL   LONG TERM GOALS: Target date: 06/24/2024  Patient will be independent with ongoing/advanced HEP for self-management at home.  Baseline:  Goal status: INITIAL  2.  Patient to demonstrate ability to achieve and maintain good spinal alignment/posturing and body mechanics needed for daily activities. Baseline:  Goal status: INITIAL  3.  Patient will demonstrate improved B LE strength to >/= 4 to 4+/5 for improved stability and ease of mobility. Baseline: Refer to above LE MMT table Goal status: INITIAL  4. Patient will report </= 32% on Modified Oswestry (MCID = 12%) to demonstrate improved functional ability with decreased pain interference. Baseline: 22 / 50 = 44.0 % Goal status: INITIAL  5.  Patient will report >/= 70% on ABC scale to demonstrate improved balance confidence and decreased risk for falls. Baseline: 920 / 1600 = 57.5 % Goal status: INITIAL  6.  Patient will be able to resume walking for exercise at least 3x/week without limitation due to LBP or impaired balance.  Baseline:  Goal status: INITIAL   7.  Patient will report ability to resume playing pickle ball without limitation due to low back pain or impaired balance.  Baseline:  Goal status: INITIAL   PLAN:  PT FREQUENCY: 1-2x/week  PT DURATION: 8 weeks  PLANNED INTERVENTIONS: 02835- PT Re-evaluation, 97750- Physical Performance Testing, 97110-Therapeutic exercises, 97530- Therapeutic activity, 97112- Neuromuscular re-education, 97535- Self Care, 02859- Manual therapy, G0283- Electrical stimulation (unattended), [phone]- Ionotophoresis 4mg /ml Dexamethasone , Patient/Family education, Balance training, Stair training, Taping, Joint mobilization, Spinal mobilization, Cryotherapy, and Moist heat  PLAN FOR NEXT SESSION: progress lumbopelvic and proximal LE strengthening, updating HEP  accordingly   Sol LITTIE Gaskins, PTA 05/03/2024, 8:48 AM

## 2024-05-08 ENCOUNTER — Encounter: Payer: Self-pay | Admitting: Physical Therapy

## 2024-05-08 ENCOUNTER — Ambulatory Visit: Attending: Neurosurgery | Admitting: Physical Therapy

## 2024-05-08 DIAGNOSIS — M6281 Muscle weakness (generalized): Secondary | ICD-10-CM | POA: Diagnosis not present

## 2024-05-08 DIAGNOSIS — R2681 Unsteadiness on feet: Secondary | ICD-10-CM | POA: Insufficient documentation

## 2024-05-08 DIAGNOSIS — M5459 Other low back pain: Secondary | ICD-10-CM | POA: Diagnosis not present

## 2024-05-08 NOTE — Therapy (Addendum)
 OUTPATIENT PHYSICAL THERAPY TREATMENT   Patient Name: Joanna Williams MRN: [id number] DOB:09-Mar-1941, 83 y.o., female Today's Date: 05/08/2024  END OF SESSION:  PT End of Session - 05/08/24 0844     Visit Number 3    Date for Recertification  06/24/24    Authorization Type [employer]    Authorization Time Period auth pending    Authorization - Visit Number 3    Progress Note Due on Visit 10    PT Start Time [phone]    Activity Tolerance Patient tolerated treatment well    Behavior During Therapy [facility] for tasks assessed/performed            Past Medical History:  Diagnosis Date   GERD (gastroesophageal reflux disease)    Heart murmur    Hyperlipidemia    Macular degeneration 05/04/2017   Past Surgical History:  Procedure Laterality Date   CARPAL TUNNEL RELEASE Left 11/2023   gramig   CATARACT EXTRACTION Bilateral 2014   COLONOSCOPY  2 yrs ago   LUMBAR LAMINECTOMY/DECOMPRESSION MICRODISCECTOMY Bilateral 02/19/2024   Procedure: LUMBAR LAMINECTOMY/DECOMPRESSION MICRODISCECTOMY 1 LEVEL;  Surgeon: Onetha Kuba, MD;  Location: [facility] OR;  Service: Neurosurgery;  Laterality: Bilateral;  Laminectomy and Foraminotomy - L4-L5 - bilateral   TOTAL HIP ARTHROPLASTY  08/10/2011   Procedure: TOTAL HIP ARTHROPLASTY;  Surgeon: Dempsey JINNY Sensor, MD;  Location: [facility] OR;  Service: Orthopedics;  Laterality: Right;  DEPUY/ PENNACLE POLY OR CERAMIC   TOTAL HIP ARTHROPLASTY Left 11/08/2022   Procedure: TOTAL HIP ARTHROPLASTY;  Surgeon: Josefina Chew, MD;  Location: [facility] ORS;  Service: Orthopedics;  Laterality: Left;   Patient Active Problem List   Diagnosis Date Noted   Spinal stenosis of lumbar region 02/19/2024   Left hip pain 09/26/2022   Insomnia 09/26/2022   Pre-op examination 09/26/2022   Preventative health care 07/18/2021   Age-related osteoporosis without current pathological fracture 06/04/2019   Murmur 06/04/2019   Tongue lesion 06/04/2019   Low back pain with radiation 06/04/2019    Right lower quadrant abdominal mass 01/24/2019   Carpal tunnel syndrome on both sides 11/06/2017   UTI (urinary tract infection) 05/12/2014   Sciatica 02/04/2014   GERD (gastroesophageal reflux disease) 08/13/2012   Hypothyroidism 06/15/2010   Benign neoplasm of skin 11/27/2009   Hyperlipidemia 02/13/2007    PCP: Antonio Cyndee Jamee JONELLE, DO   REFERRING PROVIDER: Onetha Kuba, MD   REFERRING DIAG: [phone] (ICD-10-CM) - Spinal stenosis, lumbar region with neurogenic claudication   THERAPY DIAG:  Muscle weakness (generalized)  Unsteadiness on feet  Other low back pain  RATIONALE FOR EVALUATION AND TREATMENT: Rehabilitation  ONSET DATE: 02/19/24 - Bilateral decompressive lumbar laminectomy L4-5 with foraminotomies the L4-L5 nerve roots bilaterally partial facetectomies.   NEXT MD VISIT:  None scheduled   SUBJECTIVE:  SUBJECTIVE STATEMENT: Pt reports she moves gingerly when she first gets up in the morning but once she get going she feels better.  EVAL: Pt reports ever since her back surgery in August, she states her legs feel weak and her balance is off.  She has to be very careful not to fall, but has not had any falls.  Not able to be active like she previously was (playing pickle ball).  She denies any pain, numbness or tingling.  PAIN: Are you having pain? No  PERTINENT HISTORY:  B L4-5 laminectomies & foraminectomies/partial facetectomies 02/19/24; L THA 11/2022, R THA 2013, L carpal tunnel release 11/2023, osteoporosis, GERD, macular degeneration, HLD, insomnia  PRECAUTIONS: None  RED FLAGS: None  WEIGHT BEARING RESTRICTIONS: No  FALLS:  Has patient fallen in last 6 months? No  LIVING ENVIRONMENT: Lives with: lives with their spouse Lives in: House Stairs: Yes: Internal:  17 steps; on right going up, on left going up, and can reach both and External: 2 steps; on right going up, on left going up, and can reach both Has following equipment at home: Single point cane, Walker - 2 wheeled, shower chair, and Grab bars  OCCUPATION: Retired  PLOF: Independent and Leisure: pickle ball 3-4x/wk, walking 3x/wk, yard work  PATIENT GOALS: To feel confident in my walking - feel steady.   OBJECTIVE: (objective measures completed at initial evaluation unless otherwise dated)  DIAGNOSTIC FINDINGS:  01/10/24 - MRI LUMBAR SPINE WITHOUT CONTRAST FINDINGS: Segmentation: 5 lumbar type non-rib-bearing vertebral bodies are presumed. The lowest well-formed disc space is labeled L5-S1.   Alignment: Straightening and slight reversal of the normal lumbar lordosis. Retrolisthesis of L2 on L3 and L3 on L4 and L4 on L5 similar to prior.   Vertebrae: Vertebral body heights are maintained. No bone marrow edema or evidence of fracture. Heterogeneous bone marrow signal intensity. Hemangiomas at multiple levels.   Conus medullaris and cauda equina: Conus extends to the L1 level. Conus and cauda equina appear normal.   Paraspinal and other soft tissues: The paraspinal soft tissues are unremarkable.   Disc levels:   T12-L1: Small disc bulge. No significant spinal canal or foraminal stenosis.   L1-2: No significant disc bulge. No significant spinal canal stenosis or foraminal stenosis.   L2-3: Mild disc height loss. Diffuse disc bulge eccentric to the left. Bilateral facet arthrosis indents the dorsal thecal sac. Lateral recess narrowing greater on the left. Moderate spinal canal stenosis, slightly increased from prior. Mild foraminal stenosis on the right.   L3-4: Moderate disc height loss. Disc bulge and posterior osteophytes resulting in lateral recess narrowing. Mild facet arthrosis and thickening of the ligamentum flavum indents the dorsal thecal sac. There is moderate  spinal canal stenosis, similar to prior. Mild bilateral foraminal stenosis. Lateral component of disc bulge likely contacts the extraforaminal right L3 nerve root.   L4-5: Moderate disc height loss. Diffuse disc bulge and central disc protrusion. Moderate facet arthrosis and thickening of the ligamentum flavum indents the dorsal thecal sac. Lateral recess narrowing and severe spinal canal stenosis. There is mild right and severe left foraminal stenosis.   L5-S1: Moderate disc height loss. Diffuse disc bulge and central disc protrusion resulting in lateral recess narrowing. Mild facet arthrosis. Mild spinal canal stenosis. There is moderate bilateral foraminal stenosis.   IMPRESSION: Degenerative changes as above. Disc bulge and facet arthrosis at L4-5 resulting in severe spinal canal stenosis and lateral recess narrowing, increased from prior. Additional moderate spinal canal stenosis at L2-3  is slightly increased from prior.   Multilevel foraminal stenosis, greatest and severe on the left at L4-5. Additional moderate foraminal stenosis bilaterally at L5-S1.   Lateral component of disc bulge at L3-4 likely contacts the extraforaminal portion of the right L3 nerve root.  PATIENT SURVEYS:  ABC scale: The Activities-Specific Balance Confidence (ABC) Scale 0% 10 20 30  40 50 60 70 80 90 100% No confidence<->completely confident  "How confident are you that you will not lose your balance or become unsteady when you . . .  Date tested 04/29/2024   Walk around the house 80%  2. Walk up or down stairs 60%  3. Bend over and pick up a slipper from in front of a closet floor 50%  4. Reach for a small can off a shelf at eye level 60%  5. Stand on tip toes and reach for something above your head 50%  6. Stand on a chair and reach for something 30%  7. Sweep the floor 90%  8. Walk outside the house to a car parked in the driveway 90%  9. Get into or out of a car 90%  10. Walk across a  parking lot to the mall 50%  11. Walk up or down a ramp 50%  12. Walk in a crowded mall where people rapidly walk past you 60%  13. Are bumped into by people as you walk through the mall 60%  14. Step onto or off of an escalator while you are holding onto the railing 50%  15. Step onto or off an escalator while holding onto parcels such that you cannot hold onto the railing 40%  16. Walk outside on icy sidewalks 10%  Total: #/16 920 / 1600 = 57.5 %  Level of Physical Functioning:  Moderate    Modified Oswestry:  MODIFIED OSWESTRY DISABILITY SCALE  Date:  04/29/2024   Pain intensity 3 =  Pain medication provides me with moderate relief from pain.  2. Personal care (washing, dressing, etc.) 0 =  I can take care of myself normally without causing increased pain.  3. Lifting 4 = I can lift only very light weights  4. Walking 2 =  Pain prevents me from walking more than  mile.  5. Sitting 2 =  Pain prevents me from sitting more than 1 hour.  6. Standing 5 =  Pain prevents me from standing at all  7. Sleeping 1 = I can sleep well only by using pain medication.  8. Social Life 2 = Pain prevents me from participating in more energetic activities (eg. sports, dancing).  9. Traveling 1 =  I can travel anywhere, but it increases my pain.  10. Employment/ Homemaking 1 = My normal homemaking/job activities increase my pain, but I can still perform all that is required of me  Total 22/50  % Disability 44.0 % - Severe   Interpretation of scores: Score Category Description  0-20% Minimal Disability The patient can cope with most living activities. Usually no treatment is indicated apart from advice on lifting, sitting and exercise  21-40% Moderate Disability The patient experiences more pain and difficulty with sitting, lifting and standing. Travel and social life are more difficult and they may be disabled from work. Personal care, sexual activity and sleeping are not grossly affected, and the patient  can usually be managed by conservative means  41-60% Severe Disability Pain remains the main problem in this group, but activities of daily living are affected. These patients require  a detailed investigation  61-80% Crippled Back pain impinges on all aspects of the patient's life. Positive intervention is required  81-100% Bed-bound These patients are either bed-bound or exaggerating their symptoms  Bluford FORBES Zoe DELENA Karon DELENA, et al. Surgery versus conservative management of stable thoracolumbar fracture: the PRESTO feasibility RCT. [place] ([place]): [employer]; 2021 Nov. [facility] Technology Assessment, No. 25.62.) Appendix 3, Oswestry Disability Index category descriptors. Available from: Findjewelers.cz  Minimally Clinically Important Difference (MCID) = 12.8%  SCREENING FOR RED FLAGS: Bowel or bladder incontinence: No Spinal tumors: No Cauda equina syndrome: No Compression fracture: No Abdominal aneurysm: No  COGNITION:  Overall cognitive status: Within functional limits for tasks assessed    SENSATION: WFL  POSTURE:  rounded shoulders, forward head, flexed trunk , and weight shift left  LUMBAR ROM:   Active  Eval  Flexion WNL  Extension 25% limited  Right lateral flexion WFL  Left lateral flexion WFL  Right rotation WFL  Left rotation WFL  (Blank rows = not tested)  MUSCLE LENGTH: Hamstrings: mild tight/WFL B ITB: mild tight/WFL B Piriformis: mod/severe tight B Hip flexors: mild tight B Quads:  Heelcord:   LOWER EXTREMITY ROM:    Limited in B hip IR/ER, otherwise grossly WFL  LOWER EXTREMITY MMT:    MMT Right eval Left eval  Hip flexion 4- 4- p! (Buttock)  Hip extension 3+ 3-  Hip abduction 4- 4-  Hip adduction 3- 3+  Hip internal rotation 4- 4- p! (Buttock)  Hip external rotation 4-  (Limited ROM) 4- p! (Limited ROM)  Knee flexion 4- 4  Knee extension 4+ 4+  Ankle dorsiflexion 3+ 4-  Ankle plantarflexion 3 3-   Ankle inversion    Ankle eversion     (Blank rows = not tested)   FUNCTIONAL TESTS:  5 times sit to stand: 24.25 sec Timed up and go (TUG): 15.38 sec 10 meter walk test: 13.59 sec Gait speed: 2.41 ft/sec Functional gait assessment: 11/30, < 19 = high risk fall   Functional Gait  Assessment  Gait Level Surface Walks 20 ft, slow speed, abnormal gait pattern, evidence for imbalance or deviates 10-15 in outside of the 12 in walkway width. Requires more than 7 sec to ambulate 20 ft.   Change in Gait Speed Able to change speed, demonstrates mild gait deviations, deviates 6-10 in outside of the 12 in walkway width, or no gait deviations, unable to achieve a major change in velocity, or uses a change in velocity, or uses an assistive device.   Gait with Horizontal Head Turns Performs head turns with moderate changes in gait velocity, slows down, deviates 10-15 in outside 12 in walkway width but recovers, can continue to walk.   Gait with Vertical Head Turns Performs task with slight change in gait velocity (eg, minor disruption to smooth gait path), deviates 6 - 10 in outside 12 in walkway width or uses assistive device   Gait and Pivot Turn Pivot turns safely in greater than 3 sec and stops with no loss of balance, or pivot turns safely within 3 sec and stops with mild imbalance, requires small steps to catch balance.   Step Over Obstacle Is able to step over one shoe box (4.5 in total height) but must slow down and adjust steps to clear box safely. May require verbal cueing.   Gait with Narrow Base of Support Ambulates less than 4 steps heel to toe or cannot perform without assistance.   Gait with Eyes Closed  Cannot walk 20 ft without assistance, severe gait deviations or imbalance, deviates greater than 15 in outside 12 in walkway width or will not attempt task.   Ambulating Backwards Walks 20 ft, slow speed, abnormal gait pattern, evidence for imbalance, deviates 10-15 in outside 12 in walkway  width.   Steps Two feet to a stair, must use rail.   Total Score 11   FGA comment: < 19 = high risk fall      Interpretation of scores: Non-Specific Older Adults Cutoff Score: <=22/30 = risk of falls Parkinson's Disease Cutoff score <15/30= fall risk (Hoehn & Yahr 1-4)  Minimally Clinically Important Difference (MCID)  Stroke (acute, subacute, and chronic) = MDC: 4.2 points Vestibular (acute) = MDC: 6 points Community Dwelling Older Adults =  MCID: 4 points Parkinson's Disease  =  MDC: 4.3 points  (Academy of Neurologic Physical Therapy (nd). Functional Gait Assessment. Retrieved from https://www.neuropt.org/docs/default-source/cpgs/core-outcome-measures/function-gait-assessment-pocket-guide-proof9-(2).pdf?sfvrsn=b66f35043_0.)  Berg Balance Test - 05/03/24  Sit to Stand Able to stand without using hands and stabilize independently   Standing Unsupported Able to stand safely 2 minutes   Sitting with Back Unsupported but Feet Supported on Floor or Stool Able to sit safely and securely 2 minutes   Stand to Sit Sits safely with minimal use of hands   Transfers Able to transfer safely, minor use of hands   Standing Unsupported with Eyes Closed Able to stand 10 seconds safely   Standing Unsupported with Feet Together Able to place feet together independently and stand 1 minute safely   From Standing, Reach Forward with Outstretched Arm Can reach confidently >25 cm (10)   From Standing Position, Pick up Object from Floor Able to pick up shoe safely and easily   From Standing Position, Turn to Look Behind Over each Shoulder Looks behind one side only/other side shows less weight shift   Turn 360 Degrees Able to turn 360 degrees safely in 4 seconds or less   Standing Unsupported, Alternately Place Feet on Step/Stool Able to stand independently and complete 8 steps >20 seconds   Standing Unsupported, One Foot in Front Able to plae foot ahead of the other independently and hold 30 seconds    Standing on One Leg Tries to lift leg/unable to hold 3 seconds but remains standing independently   Total Score 50   Interpretation 46-51 = Moderate (>50%) fall risk     GAIT: Distance walked: clinic distances Assistive device utilized: None Level of assistance: Complete Independence and SBA Gait pattern: step through pattern, decreased stride length, decreased hip/knee flexion- Right, decreased hip/knee flexion- Left, lateral lean- Left, and trunk flexed Comments: decreased gait speed   TODAY'S TREATMENT:    05/08/2024  THERAPEUTIC EXERCISE: To improve strength and endurance.    NuStep - L4 x 6'  THERAPEUTIC ACTIVITIES: To improve functional performance.  Demonstration, verbal and tactile cues throughout for technique.  Standing with UE support on wall ladder: Heel/toe raises x 20  Hip ABD 2 x 10, 2nd set with looped RTB at distal thighs Hip extension 2 x 10, 2nd set with looped RTB at distal thighs Hip flexion march 2 x 10, 2nd set with looped RTB at distal thighs Supported squat x 10  NEUROMUSCULAR RE-EDUCATION: To improve coordination, kinesthesia, and posture.  Seated: TrA + isometric UE press down into blue/green striped ball in lap 10 x 5 TrA + hip ADD isometric ball squeeze 10 x 5 TrA + RTB hip ABD/ER clam x 10 TrA + RTB hip flexion march 2 x  10 TrA + RTB alt unilateral hip ABD/ER clam x 10 Standing: TrA + RTB scap retraction + B shoulder rows x 10 TrA + RTB scap retraction + B shoulder extension x 10   05/03/24 NEUROMUSCULAR RE-EDUCATION: To improve coordination, kinesthesia, posture, and proprioception.  Hooklying Bent knee fallouts RTB with TrA 10x3' Marching RTB with TrA 10x3' Bridges RTB with TRA x 10 R sidelying clamshells x 10--- uncomfortable lying on L side Standing at counter top Heel/toe raises x 10 Mini squats x 10   Standardized Balance Assessment   Standardized Balance Assessment Berg Balance Test      Berg Balance Test   Sit to Stand  Able to stand without using hands and stabilize independently    Standing Unsupported Able to stand safely 2 minutes    Sitting with Back Unsupported but Feet Supported on Floor or Stool Able to sit safely and securely 2 minutes    Stand to Sit Sits safely with minimal use of hands    Transfers Able to transfer safely, minor use of hands    Standing Unsupported with Eyes Closed Able to stand 10 seconds safely    Standing Unsupported with Feet Together Able to place feet together independently and stand 1 minute safely    From Standing, Reach Forward with Outstretched Arm Can reach confidently >25 cm (10)    From Standing Position, Pick up Object from Floor Able to pick up shoe safely and easily    From Standing Position, Turn to Look Behind Over each Shoulder Looks behind one side only/other side shows less weight shift    Turn 360 Degrees Able to turn 360 degrees safely in 4 seconds or less    Standing Unsupported, Alternately Place Feet on Step/Stool Able to stand independently and complete 8 steps >20 seconds    Standing Unsupported, One Foot in Front Able to plae foot ahead of the other independently and hold 30 seconds    Standing on One Leg Tries to lift leg/unable to hold 3 seconds but remains standing independently    Total Score 50      THERAPEUTIC EXERCISE: To improve strength, endurance, ROM, and flexibility.  Bike x 5 min no resistance Reviewed HEP - figure 4 and KTOS stretches   04/29/2024 - Eval  SELF CARE:  Reviewed eval findings and role of PT in addressing identified deficits as well as instruction in initial HEP (see below).    PATIENT EDUCATION:  Education details: HEP update - standing heel/toe raises and postural awareness  Person educated: Patient Education method: Explanation, Demonstration, Verbal cues, and Handouts Education comprehension: verbalized understanding, returned demonstration, verbal cues required, and needs further education  HOME EXERCISE  PROGRAM: Access Code: [email or web address] URL: [email or web address] Date: 05/08/2024 Prepared by: Elijah Hidden  Exercises - Supine Figure 4 Piriformis Stretch  - 2 x daily - 7 x weekly - 3 reps - 30 sec hold - Supine Piriformis Stretch with Foot on Ground  - 2 x daily - 7 x weekly - 3 reps - 30 sec hold - Supine March with Resistance Band  - 1 x daily - 7 x weekly - 2 sets - 10 reps - 3-5 sec hold - Hooklying Clamshell with Resistance  - 1 x daily - 7 x weekly - 2 sets - 10 reps - 3-5 sec hold - Supine Bridge with Resistance Band  - 1 x daily - 7 x weekly - 2 sets - 10 reps - 3-5 sec hold - Mini Squat with Counter  Support  - 1 x daily - 7 x weekly - 2 sets - 10 reps - 3-5 sec hold - Heel Toe Raises with Counter Support  - 1 x daily - 7 x weekly - 2 sets - 10 reps - 3 sec hold   ASSESSMENT:  CLINICAL IMPRESSION: Nashika Coker reports initial HEP going well and denies need for review today.  Progressed core and LE strengthening with seated and additional standing exercises focusing on postural awareness and core muscle engagement with good tolerance.  Patient was able to progress to 90/90 depth squat with good body mechanics today.  Miosha Behe will benefit from continued skilled PT to address ongoing strength and balance deficits to improve mobility and activity tolerance with decreased pain interference and decreased risk for falls.    EVAL: JULEEN SORRELS is a 83 y.o. female who was referred to physical therapy for evaluation and treatment for lumbar spinal stenosis s/p B L4-5 laminectomies and foraminectomies/partial facetectomies.  She reports her pain has been well-controlled since surgery but feels like her legs have gotten weak and her balance is more impaired, causing her to have to be very careful not to fall and preventing her from participating in her PLOF activities including playing pickle ball and walking in her neighborhood.  Patient has deficits in lumbar extension and B  hip rotation ROM, proximal B LE flexibility, B LE strength, abnormal posture, and impaired balance which are interfering with ADLs and are impacting quality of life.  On Modified Oswestry patient scored 22/50 demonstrating 44% or severe disability.  Standardized balance testing revealed patient is at risk for falls and functional decline as evidenced by the following objective test measures: Gait speed 2.41 ft/sec, (2.62 ft/sec is needed for community access), TUG of 15.38 sec (>13.5 sec indicates increased risk for falls), and 5xSTS of 24.25 sec (>15 sec indicates increased risk for falls and decreased BLE power). ABC scale score of 57.5% indicates a moderate level of physical functioning.  Cambryn Charters will benefit from skilled PT to address above deficits to improve mobility and activity tolerance to help reach the maximal level of functional independence and mobility with decreased pain interference. Patient demonstrates understanding of this POC and is in agreement with this plan.   OBJECTIVE IMPAIRMENTS: Abnormal gait, decreased activity tolerance, decreased balance, decreased endurance, decreased knowledge of condition, decreased mobility, difficulty walking, decreased ROM, decreased strength, decreased safety awareness, increased fascial restrictions, impaired perceived functional ability, increased muscle spasms, impaired flexibility, improper body mechanics, postural dysfunction, and pain.   ACTIVITY LIMITATIONS: carrying, lifting, bending, sitting, standing, squatting, sleeping, stairs, transfers, bed mobility, and locomotion level  PARTICIPATION LIMITATIONS: meal prep, cleaning, laundry, interpersonal relationship, shopping, community activity, and yard work  PERSONAL FACTORS: Age, Past/current experiences, Time since onset of injury/illness/exacerbation, and 3+ comorbidities: B L4-5 laminectomies & foraminectomies/partial facetectomies 02/19/24; L THA 11/2022, R THA 2013, L carpal tunnel release  11/2023, osteoporosis, GERD, macular degeneration, HLD, insomnia are also affecting patient's functional outcome.   REHAB POTENTIAL: Good  CLINICAL DECISION MAKING: Unstable/unpredictable  EVALUATION COMPLEXITY: High   GOALS: Goals reviewed with patient? Yes  SHORT TERM GOALS: Target date: 05/27/2024  Patient will be independent with initial HEP to improve outcomes and carryover.  Baseline: HEP initiated on eval Goal status: IN PROGRESS - 05/08/24 - pt reports no concerns but only reviewed squats today, standing heel-toe raises added  2.  Patient will improve 5x STS time to </= 20 seconds to demonstrate improved functional strength and transfer efficiency.  Baseline: 24.25 sec Goal status: INITIAL  3.  Patient will demonstrate decreased TUG time to </= 13.5 sec to decrease risk for falls with transitional mobility Baseline: 15.38 sec Goal status: INITIAL   LONG TERM GOALS: Target date: 06/24/2024  Patient will be independent with ongoing/advanced HEP for self-management at home.  Baseline:  Goal status: INITIAL  2.  Patient to demonstrate ability to achieve and maintain good spinal alignment/posturing and body mechanics needed for daily activities. Baseline:  Goal status: INITIAL  3.  Patient will demonstrate improved B LE strength to >/= 4 to 4+/5 for improved stability and ease of mobility. Baseline: Refer to above LE MMT table Goal status: INITIAL  4. Patient will report </= 32% on Modified Oswestry (MCID = 12%) to demonstrate improved functional ability with decreased pain interference. Baseline: 22 / 50 = 44.0 % Goal status: INITIAL  5.  Patient will report >/= 70% on ABC scale to demonstrate improved balance confidence and decreased risk for falls. Baseline: 920 / 1600 = 57.5 % Goal status: INITIAL  6.  Patient will be able to resume walking for exercise at least 3x/week without limitation due to LBP or impaired balance.  Baseline:  Goal status: INITIAL   7.   Patient will report ability to resume playing pickle ball without limitation due to low back pain or impaired balance.  Baseline:  Goal status: INITIAL   PLAN:  PT FREQUENCY: 1-2x/week  PT DURATION: 8 weeks  PLANNED INTERVENTIONS: 97164- PT Re-evaluation, 97750- Physical Performance Testing, 97110-Therapeutic exercises, 97530- Therapeutic activity, 97112- Neuromuscular re-education, 97535- Self Care, [id number]- Manual therapy, [id number]- Electrical stimulation (unattended), [phone]- Ionotophoresis 4mg /ml Dexamethasone , Patient/Family education, Balance training, Stair training, Taping, Joint mobilization, Spinal mobilization, Cryotherapy, and Moist heat  PLAN FOR NEXT SESSION: progress lumbopelvic and proximal LE strengthening, updating HEP accordingly   Elijah CHRISTELLA Hidden, PT 05/08/2024, 8:46 AM

## 2024-05-10 ENCOUNTER — Ambulatory Visit: Admitting: Physical Therapy

## 2024-05-13 ENCOUNTER — Ambulatory Visit: Admitting: Physical Therapy

## 2024-05-13 ENCOUNTER — Encounter: Payer: Self-pay | Admitting: Physical Therapy

## 2024-05-13 DIAGNOSIS — R2681 Unsteadiness on feet: Secondary | ICD-10-CM | POA: Diagnosis not present

## 2024-05-13 DIAGNOSIS — M5459 Other low back pain: Secondary | ICD-10-CM

## 2024-05-13 DIAGNOSIS — M6281 Muscle weakness (generalized): Secondary | ICD-10-CM | POA: Diagnosis not present

## 2024-05-13 NOTE — Therapy (Signed)
 OUTPATIENT PHYSICAL THERAPY TREATMENT   Patient Name: Joanna Williams MRN: [id number] DOB:12-31-1940, 83 y.o., female Today's Date: 05/13/2024  END OF SESSION:  PT End of Session - 05/13/24 0846     Visit Number 4    Date for Recertification  06/24/24    Authorization Type [employer]    Authorization Time Period 04/29/24 - 07/28/24    Authorization - Visit Number 4    Authorization - Number of Visits 10    Progress Note Due on Visit 10    PT Start Time 0846    PT Stop Time 0932    PT Time Calculation (min) 46 min    Activity Tolerance Patient tolerated treatment well    Behavior During Therapy [facility] for tasks assessed/performed            Past Medical History:  Diagnosis Date   GERD (gastroesophageal reflux disease)    Heart murmur    Hyperlipidemia    Macular degeneration 05/04/2017   Past Surgical History:  Procedure Laterality Date   CARPAL TUNNEL RELEASE Left 11/2023   gramig   CATARACT EXTRACTION Bilateral 2014   COLONOSCOPY  2 yrs ago   LUMBAR LAMINECTOMY/DECOMPRESSION MICRODISCECTOMY Bilateral 02/19/2024   Procedure: LUMBAR LAMINECTOMY/DECOMPRESSION MICRODISCECTOMY 1 LEVEL;  Surgeon: Onetha Kuba, MD;  Location: [facility] OR;  Service: Neurosurgery;  Laterality: Bilateral;  Laminectomy and Foraminotomy - L4-L5 - bilateral   TOTAL HIP ARTHROPLASTY  08/10/2011   Procedure: TOTAL HIP ARTHROPLASTY;  Surgeon: Dempsey JINNY Sensor, MD;  Location: [facility] OR;  Service: Orthopedics;  Laterality: Right;  DEPUY/ PENNACLE POLY OR CERAMIC   TOTAL HIP ARTHROPLASTY Left 11/08/2022   Procedure: TOTAL HIP ARTHROPLASTY;  Surgeon: Josefina Chew, MD;  Location: [facility] ORS;  Service: Orthopedics;  Laterality: Left;   Patient Active Problem List   Diagnosis Date Noted   Spinal stenosis of lumbar region 02/19/2024   Left hip pain 09/26/2022   Insomnia 09/26/2022   Pre-op examination 09/26/2022   Preventative health care 07/18/2021   Age-related osteoporosis without current pathological fracture  06/04/2019   Murmur 06/04/2019   Tongue lesion 06/04/2019   Low back pain with radiation 06/04/2019   Right lower quadrant abdominal mass 01/24/2019   Carpal tunnel syndrome on both sides 11/06/2017   UTI (urinary tract infection) 05/12/2014   Sciatica 02/04/2014   GERD (gastroesophageal reflux disease) 08/13/2012   Hypothyroidism 06/15/2010   Benign neoplasm of skin 11/27/2009   Hyperlipidemia 02/13/2007    PCP: Antonio Cyndee Jamee JONELLE, DO   REFERRING PROVIDER: Onetha Kuba, MD   REFERRING DIAG: [id number] (ICD-10-CM) - Spinal stenosis, lumbar region with neurogenic claudication   THERAPY DIAG:  Muscle weakness (generalized)  Unsteadiness on feet  Other low back pain  RATIONALE FOR EVALUATION AND TREATMENT: Rehabilitation  ONSET DATE: 02/19/24 - Bilateral decompressive lumbar laminectomy L4-5 with foraminotomies the L4-L5 nerve roots bilaterally partial facetectomies.   NEXT MD VISIT:  None scheduled   SUBJECTIVE:  SUBJECTIVE STATEMENT: Pt reports she doesn't typically have any pain but just does not feel steady.  EVAL: Pt reports ever since her back surgery in August, she states her legs feel weak and her balance is off.  She has to be very careful not to fall, but has not had any falls.  Not able to be active like she previously was (playing pickle ball).  She denies any pain, numbness or tingling.  PAIN: Are you having pain? No  PERTINENT HISTORY:  B L4-5 laminectomies & foraminectomies/partial facetectomies 02/19/24; L THA 11/2022, R THA 2013, L carpal tunnel release 11/2023, osteoporosis, GERD, macular degeneration, HLD, insomnia  PRECAUTIONS: None  RED FLAGS: None  WEIGHT BEARING RESTRICTIONS: No  FALLS:  Has patient fallen in last 6 months? No  LIVING  ENVIRONMENT: Lives with: lives with their spouse Lives in: House Stairs: Yes: Internal: 17 steps; on right going up, on left going up, and can reach both and External: 2 steps; on right going up, on left going up, and can reach both Has following equipment at home: Single point cane, Walker - 2 wheeled, shower chair, and Grab bars  OCCUPATION: Retired  PLOF: Independent and Leisure: pickle ball 3-4x/wk, walking 3x/wk, yard work  PATIENT GOALS: To feel confident in my walking - feel steady.   OBJECTIVE: (objective measures completed at initial evaluation unless otherwise dated)  DIAGNOSTIC FINDINGS:  01/10/24 - MRI LUMBAR SPINE WITHOUT CONTRAST FINDINGS: Segmentation: 5 lumbar type non-rib-bearing vertebral bodies are presumed. The lowest well-formed disc space is labeled L5-S1.   Alignment: Straightening and slight reversal of the normal lumbar lordosis. Retrolisthesis of L2 on L3 and L3 on L4 and L4 on L5 similar to prior.   Vertebrae: Vertebral body heights are maintained. No bone marrow edema or evidence of fracture. Heterogeneous bone marrow signal intensity. Hemangiomas at multiple levels.   Conus medullaris and cauda equina: Conus extends to the L1 level. Conus and cauda equina appear normal.   Paraspinal and other soft tissues: The paraspinal soft tissues are unremarkable.   Disc levels:   T12-L1: Small disc bulge. No significant spinal canal or foraminal stenosis.   L1-2: No significant disc bulge. No significant spinal canal stenosis or foraminal stenosis.   L2-3: Mild disc height loss. Diffuse disc bulge eccentric to the left. Bilateral facet arthrosis indents the dorsal thecal sac. Lateral recess narrowing greater on the left. Moderate spinal canal stenosis, slightly increased from prior. Mild foraminal stenosis on the right.   L3-4: Moderate disc height loss. Disc bulge and posterior osteophytes resulting in lateral recess narrowing. Mild facet arthrosis  and thickening of the ligamentum flavum indents the dorsal thecal sac. There is moderate spinal canal stenosis, similar to prior. Mild bilateral foraminal stenosis. Lateral component of disc bulge likely contacts the extraforaminal right L3 nerve root.   L4-5: Moderate disc height loss. Diffuse disc bulge and central disc protrusion. Moderate facet arthrosis and thickening of the ligamentum flavum indents the dorsal thecal sac. Lateral recess narrowing and severe spinal canal stenosis. There is mild right and severe left foraminal stenosis.   L5-S1: Moderate disc height loss. Diffuse disc bulge and central disc protrusion resulting in lateral recess narrowing. Mild facet arthrosis. Mild spinal canal stenosis. There is moderate bilateral foraminal stenosis.   IMPRESSION: Degenerative changes as above. Disc bulge and facet arthrosis at L4-5 resulting in severe spinal canal stenosis and lateral recess narrowing, increased from prior. Additional moderate spinal canal stenosis at L2-3 is slightly increased from prior.  Multilevel foraminal stenosis, greatest and severe on the left at L4-5. Additional moderate foraminal stenosis bilaterally at L5-S1.   Lateral component of disc bulge at L3-4 likely contacts the extraforaminal portion of the right L3 nerve root.  PATIENT SURVEYS:  ABC scale: The Activities-Specific Balance Confidence (ABC) Scale 0% 10 20 30  40 50 60 70 80 90 100% No confidence<->completely confident  "How confident are you that you will not lose your balance or become unsteady when you . . .  Date tested 04/29/2024   Walk around the house 80%  2. Walk up or down stairs 60%  3. Bend over and pick up a slipper from in front of a closet floor 50%  4. Reach for a small can off a shelf at eye level 60%  5. Stand on tip toes and reach for something above your head 50%  6. Stand on a chair and reach for something 30%  7. Sweep the floor 90%  8. Walk outside the house to a  car parked in the driveway 90%  9. Get into or out of a car 90%  10. Walk across a parking lot to the mall 50%  11. Walk up or down a ramp 50%  12. Walk in a crowded mall where people rapidly walk past you 60%  13. Are bumped into by people as you walk through the mall 60%  14. Step onto or off of an escalator while you are holding onto the railing 50%  15. Step onto or off an escalator while holding onto parcels such that you cannot hold onto the railing 40%  16. Walk outside on icy sidewalks 10%  Total: #/16 920 / 1600 = 57.5 %  Level of Physical Functioning:  Moderate    Modified Oswestry:  MODIFIED OSWESTRY DISABILITY SCALE  Date:  04/29/2024   Pain intensity 3 =  Pain medication provides me with moderate relief from pain.  2. Personal care (washing, dressing, etc.) 0 =  I can take care of myself normally without causing increased pain.  3. Lifting 4 = I can lift only very light weights  4. Walking 2 =  Pain prevents me from walking more than  mile.  5. Sitting 2 =  Pain prevents me from sitting more than 1 hour.  6. Standing 5 =  Pain prevents me from standing at all  7. Sleeping 1 = I can sleep well only by using pain medication.  8. Social Life 2 = Pain prevents me from participating in more energetic activities (eg. sports, dancing).  9. Traveling 1 =  I can travel anywhere, but it increases my pain.  10. Employment/ Homemaking 1 = My normal homemaking/job activities increase my pain, but I can still perform all that is required of me  Total 22/50  % Disability 44.0 % - Severe   Interpretation of scores: Score Category Description  0-20% Minimal Disability The patient can cope with most living activities. Usually no treatment is indicated apart from advice on lifting, sitting and exercise  21-40% Moderate Disability The patient experiences more pain and difficulty with sitting, lifting and standing. Travel and social life are more difficult and they may be disabled from work.  Personal care, sexual activity and sleeping are not grossly affected, and the patient can usually be managed by conservative means  41-60% Severe Disability Pain remains the main problem in this group, but activities of daily living are affected. These patients require a detailed investigation  61-80% Crippled Back  pain impinges on all aspects of the patient's life. Positive intervention is required  81-100% Bed-bound These patients are either bed-bound or exaggerating their symptoms  Bluford FORBES Zoe DELENA Karon DELENA, et al. Surgery versus conservative management of stable thoracolumbar fracture: the PRESTO feasibility RCT. [place] ([place]): [employer]; 2021 Nov. [facility] Technology Assessment, No. 25.62.) Appendix 3, Oswestry Disability Index category descriptors. Available from: Findjewelers.cz  Minimally Clinically Important Difference (MCID) = 12.8%  SCREENING FOR RED FLAGS: Bowel or bladder incontinence: No Spinal tumors: No Cauda equina syndrome: No Compression fracture: No Abdominal aneurysm: No  COGNITION:  Overall cognitive status: Within functional limits for tasks assessed    SENSATION: WFL  POSTURE:  rounded shoulders, forward head, flexed trunk , and weight shift left  LUMBAR ROM:   Active  Eval  Flexion WNL  Extension 25% limited  Right lateral flexion WFL  Left lateral flexion WFL  Right rotation WFL  Left rotation WFL  (Blank rows = not tested)  MUSCLE LENGTH: Hamstrings: mild tight/WFL B ITB: mild tight/WFL B Piriformis: mod/severe tight B Hip flexors: mild tight B Quads:  Heelcord:   LOWER EXTREMITY ROM:    Limited in B hip IR/ER, otherwise grossly WFL  LOWER EXTREMITY MMT:    MMT Right eval Left eval  Hip flexion 4- 4- p! (Buttock)  Hip extension 3+ 3-  Hip abduction 4- 4-  Hip adduction 3- 3+  Hip internal rotation 4- 4- p! (Buttock)  Hip external rotation 4-  (Limited ROM) 4- p! (Limited ROM)  Knee  flexion 4- 4  Knee extension 4+ 4+  Ankle dorsiflexion 3+ 4-  Ankle plantarflexion 3 3-  Ankle inversion    Ankle eversion     (Blank rows = not tested)   FUNCTIONAL TESTS:  5 times sit to stand: 24.25 sec Timed up and go (TUG): 15.38 sec 10 meter walk test: 13.59 sec Gait speed: 2.41 ft/sec Functional gait assessment: 11/30, < 19 = high risk fall   Functional Gait  Assessment  Gait Level Surface Walks 20 ft, slow speed, abnormal gait pattern, evidence for imbalance or deviates 10-15 in outside of the 12 in walkway width. Requires more than 7 sec to ambulate 20 ft.   Change in Gait Speed Able to change speed, demonstrates mild gait deviations, deviates 6-10 in outside of the 12 in walkway width, or no gait deviations, unable to achieve a major change in velocity, or uses a change in velocity, or uses an assistive device.   Gait with Horizontal Head Turns Performs head turns with moderate changes in gait velocity, slows down, deviates 10-15 in outside 12 in walkway width but recovers, can continue to walk.   Gait with Vertical Head Turns Performs task with slight change in gait velocity (eg, minor disruption to smooth gait path), deviates 6 - 10 in outside 12 in walkway width or uses assistive device   Gait and Pivot Turn Pivot turns safely in greater than 3 sec and stops with no loss of balance, or pivot turns safely within 3 sec and stops with mild imbalance, requires small steps to catch balance.   Step Over Obstacle Is able to step over one shoe box (4.5 in total height) but must slow down and adjust steps to clear box safely. May require verbal cueing.   Gait with Narrow Base of Support Ambulates less than 4 steps heel to toe or cannot perform without assistance.   Gait with Eyes Closed Cannot walk 20 ft without assistance, severe  gait deviations or imbalance, deviates greater than 15 in outside 12 in walkway width or will not attempt task.   Ambulating Backwards Walks 20 ft, slow speed,  abnormal gait pattern, evidence for imbalance, deviates 10-15 in outside 12 in walkway width.   Steps Two feet to a stair, must use rail.   Total Score 11   FGA comment: < 19 = high risk fall      Interpretation of scores: Non-Specific Older Adults Cutoff Score: <=22/30 = risk of falls Parkinson's Disease Cutoff score <15/30= fall risk (Hoehn & Yahr 1-4)  Minimally Clinically Important Difference (MCID)  Stroke (acute, subacute, and chronic) = MDC: 4.2 points Vestibular (acute) = MDC: 6 points Community Dwelling Older Adults =  MCID: 4 points Parkinson's Disease  =  MDC: 4.3 points  (Academy of Neurologic Physical Therapy (nd). Functional Gait Assessment. Retrieved from https://www.neuropt.org/docs/default-source/cpgs/core-outcome-measures/function-gait-assessment-pocket-guide-proof9-(2).pdf?sfvrsn=b34f35043_0.)  Berg Balance Test - 05/03/24  Sit to Stand Able to stand without using hands and stabilize independently   Standing Unsupported Able to stand safely 2 minutes   Sitting with Back Unsupported but Feet Supported on Floor or Stool Able to sit safely and securely 2 minutes   Stand to Sit Sits safely with minimal use of hands   Transfers Able to transfer safely, minor use of hands   Standing Unsupported with Eyes Closed Able to stand 10 seconds safely   Standing Unsupported with Feet Together Able to place feet together independently and stand 1 minute safely   From Standing, Reach Forward with Outstretched Arm Can reach confidently >25 cm (10)   From Standing Position, Pick up Object from Floor Able to pick up shoe safely and easily   From Standing Position, Turn to Look Behind Over each Shoulder Looks behind one side only/other side shows less weight shift   Turn 360 Degrees Able to turn 360 degrees safely in 4 seconds or less   Standing Unsupported, Alternately Place Feet on Step/Stool Able to stand independently and complete 8 steps >20 seconds   Standing Unsupported, One Foot  in Front Able to plae foot ahead of the other independently and hold 30 seconds   Standing on One Leg Tries to lift leg/unable to hold 3 seconds but remains standing independently   Total Score 50   Interpretation 46-51 = Moderate (>50%) fall risk     GAIT: Distance walked: clinic distances Assistive device utilized: None Level of assistance: Complete Independence and SBA Gait pattern: step through pattern, decreased stride length, decreased hip/knee flexion- Right, decreased hip/knee flexion- Left, lateral lean- Left, and trunk flexed Comments: decreased gait speed   TODAY'S TREATMENT:   05/13/2024  THERAPEUTIC EXERCISE: To improve strength and endurance.    Rec Bike - L2 x 6'  NEUROMUSCULAR RE-EDUCATION: To improve coordination, kinesthesia, and posture. Hooklying: TrA + GTB hip flexion march 2 x 10 TrA + GTB hip ABD/ER clam 2 x 10 TrA + Bridges with GTB hip ABD isometric 2 x 10 TrA + dead bug x 10 TrA + hip ADD isometric ball squeeze 10 x 5  THERAPEUTIC ACTIVITIES: To improve functional performance.  Demonstration, verbal and tactile cues throughout for technique.  Standing with UE support on counter: Supported squat 2 x 10 - cues to avoid knee fwd of toes and avoid >90/90 squat depth Heel/toe raises x 20  Hip ABD with looped RTB at distal thighs x 10 Hip extension with looped RTB at distal thighs x 10 Hip flexion march with looped RTB at distal thighs x 10  B side-stepping with looped RTB at distal thighs 3 x 10 ft Fwd/back monster walk with looped RTB at distal thighs 3 x 10 ft   05/08/2024  THERAPEUTIC EXERCISE: To improve strength and endurance.    NuStep - L4 x 6'  THERAPEUTIC ACTIVITIES: To improve functional performance.  Demonstration, verbal and tactile cues throughout for technique.  Standing with UE support on wall ladder: Heel/toe raises x 20  Hip ABD 2 x 10, 2nd set with looped RTB at distal thighs Hip extension 2 x 10, 2nd set with looped RTB at distal  thighs Hip flexion march 2 x 10, 2nd set with looped RTB at distal thighs Supported squat x 10  NEUROMUSCULAR RE-EDUCATION: To improve coordination, kinesthesia, and posture.  Seated: TrA + isometric UE press down into blue/green striped ball in lap 10 x 5 TrA + hip ADD isometric ball squeeze 10 x 5 TrA + RTB hip ABD/ER clam x 10 TrA + RTB hip flexion march 2 x 10 TrA + RTB alt unilateral hip ABD/ER clam x 10 Standing: TrA + RTB scap retraction + B shoulder rows x 10 TrA + RTB scap retraction + B shoulder extension x 10   05/03/24 NEUROMUSCULAR RE-EDUCATION: To improve coordination, kinesthesia, posture, and proprioception.  Hooklying Bent knee fallouts RTB with TrA 10x3' Marching RTB with TrA 10x3' Bridges RTB with TRA x 10 R sidelying clamshells x 10--- uncomfortable lying on L side Standing at counter top Heel/toe raises x 10 Mini squats x 10   Standardized Balance Assessment   Standardized Balance Assessment Berg Balance Test      Berg Balance Test   Sit to Stand Able to stand without using hands and stabilize independently    Standing Unsupported Able to stand safely 2 minutes    Sitting with Back Unsupported but Feet Supported on Floor or Stool Able to sit safely and securely 2 minutes    Stand to Sit Sits safely with minimal use of hands    Transfers Able to transfer safely, minor use of hands    Standing Unsupported with Eyes Closed Able to stand 10 seconds safely    Standing Unsupported with Feet Together Able to place feet together independently and stand 1 minute safely    From Standing, Reach Forward with Outstretched Arm Can reach confidently >25 cm (10)    From Standing Position, Pick up Object from Floor Able to pick up shoe safely and easily    From Standing Position, Turn to Look Behind Over each Shoulder Looks behind one side only/other side shows less weight shift    Turn 360 Degrees Able to turn 360 degrees safely in 4 seconds or less    Standing  Unsupported, Alternately Place Feet on Step/Stool Able to stand independently and complete 8 steps >20 seconds    Standing Unsupported, One Foot in Front Able to plae foot ahead of the other independently and hold 30 seconds    Standing on One Leg Tries to lift leg/unable to hold 3 seconds but remains standing independently    Total Score 50      THERAPEUTIC EXERCISE: To improve strength, endurance, ROM, and flexibility.  Bike x 5 min no resistance Reviewed HEP - figure 4 and KTOS stretches   04/29/2024 - Eval  SELF CARE:  Reviewed eval findings and role of PT in addressing identified deficits as well as instruction in initial HEP (see below).    PATIENT EDUCATION:  Education details: HEP update - standing hip strengthening, HEP  modification - alternating supine and standing exercises on opp days, and postural awareness  Person educated: Patient Education method: Explanation, Demonstration, Verbal cues, and Handouts Education comprehension: verbalized understanding, returned demonstration, verbal cues required, and needs further education  HOME EXERCISE PROGRAM: THERAPEUTIC ACTIVITIES: To improve functional performance.  Demonstration, verbal and tactile cues throughout for technique.  Standing with UE support on wall ladder: Heel/toe raises x 20  Hip ABD 2 x 10, 2nd set with looped RTB at distal thighs Hip extension 2 x 10, 2nd set with looped RTB at distal thighs Hip flexion march 2 x 10, 2nd set with looped RTB at distal thighs Supported squat x 10   ASSESSMENT:  CLINICAL IMPRESSION: Joanna Williams reports she prefers the supine exercises rather than the seated versions attempted last visit, other than noting some difficulty getting up/down from the floor.  Reviewed HEP with only minor clarification of positioning and/or technique with a few exercises.  Continued hip strengthening in standing with stationary and stepping activities with pt reporting better strengthening feel with  the stationary hip exercises, therefore added these to HEP to be performed every other day alternating days with supine core + hip strengthening.  Joanna Williams will benefit from continued skilled PT to address ongoing strength and balance deficits to improve mobility and activity tolerance with decreased pain interference and decreased risk for falls.    EVAL: Joanna Williams is a 83 y.o. female who was referred to physical therapy for evaluation and treatment for lumbar spinal stenosis s/p B L4-5 laminectomies and foraminectomies/partial facetectomies.  She reports her pain has been well-controlled since surgery but feels like her legs have gotten weak and her balance is more impaired, causing her to have to be very careful not to fall and preventing her from participating in her PLOF activities including playing pickle ball and walking in her neighborhood.  Patient has deficits in lumbar extension and B hip rotation ROM, proximal B LE flexibility, B LE strength, abnormal posture, and impaired balance which are interfering with ADLs and are impacting quality of life.  On Modified Oswestry patient scored 22/50 demonstrating 44% or severe disability.  Standardized balance testing revealed patient is at risk for falls and functional decline as evidenced by the following objective test measures: Gait speed 2.41 ft/sec, (2.62 ft/sec is needed for community access), TUG of 15.38 sec (>13.5 sec indicates increased risk for falls), and 5xSTS of 24.25 sec (>15 sec indicates increased risk for falls and decreased BLE power). ABC scale score of 57.5% indicates a moderate level of physical functioning.  Joanna Williams will benefit from skilled PT to address above deficits to improve mobility and activity tolerance to help reach the maximal level of functional independence and mobility with decreased pain interference. Patient demonstrates understanding of this POC and is in agreement with this plan.   OBJECTIVE  IMPAIRMENTS: Abnormal gait, decreased activity tolerance, decreased balance, decreased endurance, decreased knowledge of condition, decreased mobility, difficulty walking, decreased ROM, decreased strength, decreased safety awareness, increased fascial restrictions, impaired perceived functional ability, increased muscle spasms, impaired flexibility, improper body mechanics, postural dysfunction, and pain.   ACTIVITY LIMITATIONS: carrying, lifting, bending, sitting, standing, squatting, sleeping, stairs, transfers, bed mobility, and locomotion level  PARTICIPATION LIMITATIONS: meal prep, cleaning, laundry, interpersonal relationship, shopping, community activity, and yard work  PERSONAL FACTORS: Age, Past/current experiences, Time since onset of injury/illness/exacerbation, and 3+ comorbidities: B L4-5 laminectomies & foraminectomies/partial facetectomies 02/19/24; L THA 11/2022, R THA 2013, L carpal tunnel release 11/2023, osteoporosis, GERD,  macular degeneration, HLD, insomnia are also affecting patient's functional outcome.   REHAB POTENTIAL: Good  CLINICAL DECISION MAKING: Unstable/unpredictable  EVALUATION COMPLEXITY: High   GOALS: Goals reviewed with patient? Yes  SHORT TERM GOALS: Target date: 05/27/2024  Patient will be independent with initial HEP to improve outcomes and carryover.  Baseline: HEP initiated on eval 05/08/24 - pt reports no concerns but only reviewed squats today, standing heel-toe raises added Goal status: MET - 05/13/24  2.  Patient will improve 5x STS time to </= 20 seconds to demonstrate improved functional strength and transfer efficiency. Baseline: 24.25 sec Goal status: INITIAL  3.  Patient will demonstrate decreased TUG time to </= 13.5 sec to decrease risk for falls with transitional mobility Baseline: 15.38 sec Goal status: INITIAL   LONG TERM GOALS: Target date: 06/24/2024  Patient will be independent with ongoing/advanced HEP for self-management at  home.  Baseline:  Goal status: INITIAL  2.  Patient to demonstrate ability to achieve and maintain good spinal alignment/posturing and body mechanics needed for daily activities. Baseline:  Goal status: INITIAL  3.  Patient will demonstrate improved B LE strength to >/= 4 to 4+/5 for improved stability and ease of mobility. Baseline: Refer to above LE MMT table Goal status: INITIAL  4. Patient will report </= 32% on Modified Oswestry (MCID = 12%) to demonstrate improved functional ability with decreased pain interference. Baseline: 22 / 50 = 44.0 % Goal status: INITIAL  5.  Patient will report >/= 70% on ABC scale to demonstrate improved balance confidence and decreased risk for falls. Baseline: 920 / 1600 = 57.5 % Goal status: INITIAL  6.  Patient will be able to resume walking for exercise at least 3x/week without limitation due to LBP or impaired balance.  Baseline:  Goal status: INITIAL   7.  Patient will report ability to resume playing pickle ball without limitation due to low back pain or impaired balance.  Baseline:  Goal status: INITIAL   PLAN:  PT FREQUENCY: 1-2x/week  PT DURATION: 8 weeks  PLANNED INTERVENTIONS: 97164- PT Re-evaluation, 97750- Physical Performance Testing, 97110-Therapeutic exercises, 97530- Therapeutic activity, 97112- Neuromuscular re-education, 97535- Self Care, [id number]- Manual therapy, [id number]- Electrical stimulation (unattended), [phone]- Ionotophoresis 4mg /ml Dexamethasone , Patient/Family education, Balance training, Stair training, Taping, Joint mobilization, Spinal mobilization, Cryotherapy, and Moist heat  PLAN FOR NEXT SESSION: progress lumbopelvic and proximal LE strengthening, updating HEP accordingly   Elijah CHRISTELLA Hidden, PT 05/13/2024, 9:38 AM

## 2024-05-14 DIAGNOSIS — I700 Atherosclerosis of aorta: Secondary | ICD-10-CM | POA: Diagnosis not present

## 2024-05-14 DIAGNOSIS — E049 Nontoxic goiter, unspecified: Secondary | ICD-10-CM | POA: Diagnosis not present

## 2024-05-14 DIAGNOSIS — E785 Hyperlipidemia, unspecified: Secondary | ICD-10-CM | POA: Diagnosis not present

## 2024-05-14 DIAGNOSIS — E039 Hypothyroidism, unspecified: Secondary | ICD-10-CM | POA: Diagnosis not present

## 2024-05-14 DIAGNOSIS — H353290 Exudative age-related macular degeneration, unspecified eye, stage unspecified: Secondary | ICD-10-CM | POA: Diagnosis not present

## 2024-05-14 DIAGNOSIS — G4700 Insomnia, unspecified: Secondary | ICD-10-CM | POA: Diagnosis not present

## 2024-05-14 DIAGNOSIS — M4800 Spinal stenosis, site unspecified: Secondary | ICD-10-CM | POA: Diagnosis not present

## 2024-05-14 DIAGNOSIS — M810 Age-related osteoporosis without current pathological fracture: Secondary | ICD-10-CM | POA: Diagnosis not present

## 2024-05-14 DIAGNOSIS — K219 Gastro-esophageal reflux disease without esophagitis: Secondary | ICD-10-CM | POA: Diagnosis not present

## 2024-05-14 DIAGNOSIS — I2510 Atherosclerotic heart disease of native coronary artery without angina pectoris: Secondary | ICD-10-CM | POA: Diagnosis not present

## 2024-05-14 DIAGNOSIS — Z7983 Long term (current) use of bisphosphonates: Secondary | ICD-10-CM | POA: Diagnosis not present

## 2024-05-14 DIAGNOSIS — M1990 Unspecified osteoarthritis, unspecified site: Secondary | ICD-10-CM | POA: Diagnosis not present

## 2024-05-16 ENCOUNTER — Ambulatory Visit

## 2024-05-16 DIAGNOSIS — M6281 Muscle weakness (generalized): Secondary | ICD-10-CM

## 2024-05-16 DIAGNOSIS — M5459 Other low back pain: Secondary | ICD-10-CM

## 2024-05-16 DIAGNOSIS — R2681 Unsteadiness on feet: Secondary | ICD-10-CM | POA: Diagnosis not present

## 2024-05-16 NOTE — Therapy (Signed)
 OUTPATIENT PHYSICAL THERAPY TREATMENT   Patient Name: Joanna Williams MRN: [id number] DOB:[birth year], 83 y.o., female Today's Date: 05/16/2024  END OF SESSION:  PT End of Session - 05/16/24 1018     Visit Number 5    Date for Recertification  06/24/24    Authorization Type [employer]    Authorization Time Period 04/29/24 - 07/28/24    Authorization - Visit Number 5    Authorization - Number of Visits 10    Progress Note Due on Visit 10    PT Start Time 0932    PT Stop Time 1016    PT Time Calculation (min) 44 min    Activity Tolerance Patient tolerated treatment well    Behavior During Therapy [facility] for tasks assessed/performed             Past Medical History:  Diagnosis Date   GERD (gastroesophageal reflux disease)    Heart murmur    Hyperlipidemia    Macular degeneration 05/04/2017   Past Surgical History:  Procedure Laterality Date   CARPAL TUNNEL RELEASE Left 11/2023   gramig   CATARACT EXTRACTION Bilateral 2014   COLONOSCOPY  2 yrs ago   LUMBAR LAMINECTOMY/DECOMPRESSION MICRODISCECTOMY Bilateral 02/19/2024   Procedure: LUMBAR LAMINECTOMY/DECOMPRESSION MICRODISCECTOMY 1 LEVEL;  Surgeon: Onetha Kuba, MD;  Location: [facility] OR;  Service: Neurosurgery;  Laterality: Bilateral;  Laminectomy and Foraminotomy - L4-L5 - bilateral   TOTAL HIP ARTHROPLASTY  08/10/2011   Procedure: TOTAL HIP ARTHROPLASTY;  Surgeon: Dempsey JINNY Sensor, MD;  Location: [facility] OR;  Service: Orthopedics;  Laterality: Right;  DEPUY/ PENNACLE POLY OR CERAMIC   TOTAL HIP ARTHROPLASTY Left 11/08/2022   Procedure: TOTAL HIP ARTHROPLASTY;  Surgeon: Josefina Chew, MD;  Location: [facility] ORS;  Service: Orthopedics;  Laterality: Left;   Patient Active Problem List   Diagnosis Date Noted   Spinal stenosis of lumbar region 02/19/2024   Left hip pain 09/26/2022   Insomnia 09/26/2022   Pre-op examination 09/26/2022   Preventative health care 07/18/2021   Age-related osteoporosis without current pathological  fracture 06/04/2019   Murmur 06/04/2019   Tongue lesion 06/04/2019   Low back pain with radiation 06/04/2019   Right lower quadrant abdominal mass 01/24/2019   Carpal tunnel syndrome on both sides 11/06/2017   UTI (urinary tract infection) 05/12/2014   Sciatica 02/04/2014   GERD (gastroesophageal reflux disease) 08/13/2012   Hypothyroidism 06/15/2010   Benign neoplasm of skin 11/27/2009   Hyperlipidemia 02/13/2007    PCP: Antonio Cyndee Jamee JONELLE, DO   REFERRING PROVIDER: Onetha Kuba, MD   REFERRING DIAG: [phone] (ICD-10-CM) - Spinal stenosis, lumbar region with neurogenic claudication   THERAPY DIAG:  Muscle weakness (generalized)  Unsteadiness on feet  Other low back pain  RATIONALE FOR EVALUATION AND TREATMENT: Rehabilitation  ONSET DATE: 02/19/24 - Bilateral decompressive lumbar laminectomy L4-5 with foraminotomies the L4-L5 nerve roots bilaterally partial facetectomies.   NEXT MD VISIT:  None scheduled   SUBJECTIVE:  SUBJECTIVE STATEMENT: Pt is sore from blowing and raking leaves yesterday, wants to take it easy.  EVAL: Pt reports ever since her back surgery in August, she states her legs feel weak and her balance is off.  She has to be very careful not to fall, but has not had any falls.  Not able to be active like she previously was (playing pickle ball).  She denies any pain, numbness or tingling.  PAIN: Are you having pain? No  PERTINENT HISTORY:  B L4-5 laminectomies & foraminectomies/partial facetectomies 02/19/24; L THA 11/2022, R THA 2013, L carpal tunnel release 11/2023, osteoporosis, GERD, macular degeneration, HLD, insomnia  PRECAUTIONS: None  RED FLAGS: None  WEIGHT BEARING RESTRICTIONS: No  FALLS:  Has patient fallen in last 6 months? No  LIVING  ENVIRONMENT: Lives with: lives with their spouse Lives in: House Stairs: Yes: Internal: 17 steps; on right going up, on left going up, and can reach both and External: 2 steps; on right going up, on left going up, and can reach both Has following equipment at home: Single point cane, Walker - 2 wheeled, shower chair, and Grab bars  OCCUPATION: Retired  PLOF: Independent and Leisure: pickle ball 3-4x/wk, walking 3x/wk, yard work  PATIENT GOALS: To feel confident in my walking - feel steady.   OBJECTIVE: (objective measures completed at initial evaluation unless otherwise dated)  DIAGNOSTIC FINDINGS:  01/10/24 - MRI LUMBAR SPINE WITHOUT CONTRAST FINDINGS: Segmentation: 5 lumbar type non-rib-bearing vertebral bodies are presumed. The lowest well-formed disc space is labeled L5-S1.   Alignment: Straightening and slight reversal of the normal lumbar lordosis. Retrolisthesis of L2 on L3 and L3 on L4 and L4 on L5 similar to prior.   Vertebrae: Vertebral body heights are maintained. No bone marrow edema or evidence of fracture. Heterogeneous bone marrow signal intensity. Hemangiomas at multiple levels.   Conus medullaris and cauda equina: Conus extends to the L1 level. Conus and cauda equina appear normal.   Paraspinal and other soft tissues: The paraspinal soft tissues are unremarkable.   Disc levels:   T12-L1: Small disc bulge. No significant spinal canal or foraminal stenosis.   L1-2: No significant disc bulge. No significant spinal canal stenosis or foraminal stenosis.   L2-3: Mild disc height loss. Diffuse disc bulge eccentric to the left. Bilateral facet arthrosis indents the dorsal thecal sac. Lateral recess narrowing greater on the left. Moderate spinal canal stenosis, slightly increased from prior. Mild foraminal stenosis on the right.   L3-4: Moderate disc height loss. Disc bulge and posterior osteophytes resulting in lateral recess narrowing. Mild facet arthrosis  and thickening of the ligamentum flavum indents the dorsal thecal sac. There is moderate spinal canal stenosis, similar to prior. Mild bilateral foraminal stenosis. Lateral component of disc bulge likely contacts the extraforaminal right L3 nerve root.   L4-5: Moderate disc height loss. Diffuse disc bulge and central disc protrusion. Moderate facet arthrosis and thickening of the ligamentum flavum indents the dorsal thecal sac. Lateral recess narrowing and severe spinal canal stenosis. There is mild right and severe left foraminal stenosis.   L5-S1: Moderate disc height loss. Diffuse disc bulge and central disc protrusion resulting in lateral recess narrowing. Mild facet arthrosis. Mild spinal canal stenosis. There is moderate bilateral foraminal stenosis.   IMPRESSION: Degenerative changes as above. Disc bulge and facet arthrosis at L4-5 resulting in severe spinal canal stenosis and lateral recess narrowing, increased from prior. Additional moderate spinal canal stenosis at L2-3 is slightly increased from prior.  Multilevel foraminal stenosis, greatest and severe on the left at L4-5. Additional moderate foraminal stenosis bilaterally at L5-S1.   Lateral component of disc bulge at L3-4 likely contacts the extraforaminal portion of the right L3 nerve root.  PATIENT SURVEYS:  ABC scale: The Activities-Specific Balance Confidence (ABC) Scale 0% 10 20 30  40 50 60 70 80 90 100% No confidence<->completely confident  "How confident are you that you will not lose your balance or become unsteady when you . . .  Date tested 04/29/2024   Walk around the house 80%  2. Walk up or down stairs 60%  3. Bend over and pick up a slipper from in front of a closet floor 50%  4. Reach for a small can off a shelf at eye level 60%  5. Stand on tip toes and reach for something above your head 50%  6. Stand on a chair and reach for something 30%  7. Sweep the floor 90%  8. Walk outside the house to a  car parked in the driveway 90%  9. Get into or out of a car 90%  10. Walk across a parking lot to the mall 50%  11. Walk up or down a ramp 50%  12. Walk in a crowded mall where people rapidly walk past you 60%  13. Are bumped into by people as you walk through the mall 60%  14. Step onto or off of an escalator while you are holding onto the railing 50%  15. Step onto or off an escalator while holding onto parcels such that you cannot hold onto the railing 40%  16. Walk outside on icy sidewalks 10%  Total: #/16 920 / 1600 = 57.5 %  Level of Physical Functioning:  Moderate    Modified Oswestry:  MODIFIED OSWESTRY DISABILITY SCALE  Date:  04/29/2024   Pain intensity 3 =  Pain medication provides me with moderate relief from pain.  2. Personal care (washing, dressing, etc.) 0 =  I can take care of myself normally without causing increased pain.  3. Lifting 4 = I can lift only very light weights  4. Walking 2 =  Pain prevents me from walking more than  mile.  5. Sitting 2 =  Pain prevents me from sitting more than 1 hour.  6. Standing 5 =  Pain prevents me from standing at all  7. Sleeping 1 = I can sleep well only by using pain medication.  8. Social Life 2 = Pain prevents me from participating in more energetic activities (eg. sports, dancing).  9. Traveling 1 =  I can travel anywhere, but it increases my pain.  10. Employment/ Homemaking 1 = My normal homemaking/job activities increase my pain, but I can still perform all that is required of me  Total 22/50  % Disability 44.0 % - Severe   Interpretation of scores: Score Category Description  0-20% Minimal Disability The patient can cope with most living activities. Usually no treatment is indicated apart from advice on lifting, sitting and exercise  21-40% Moderate Disability The patient experiences more pain and difficulty with sitting, lifting and standing. Travel and social life are more difficult and they may be disabled from work.  Personal care, sexual activity and sleeping are not grossly affected, and the patient can usually be managed by conservative means  41-60% Severe Disability Pain remains the main problem in this group, but activities of daily living are affected. These patients require a detailed investigation  61-80% Crippled Back  pain impinges on all aspects of the patient's life. Positive intervention is required  81-100% Bed-bound These patients are either bed-bound or exaggerating their symptoms  Bluford FORBES Zoe DELENA Karon DELENA, et al. Surgery versus conservative management of stable thoracolumbar fracture: the PRESTO feasibility RCT. [place] ([place]): [employer]; 2021 Nov. [facility] Technology Assessment, No. 25.62.) Appendix 3, Oswestry Disability Index category descriptors. Available from: Findjewelers.cz  Minimally Clinically Important Difference (MCID) = 12.8%  SCREENING FOR RED FLAGS: Bowel or bladder incontinence: No Spinal tumors: No Cauda equina syndrome: No Compression fracture: No Abdominal aneurysm: No  COGNITION:  Overall cognitive status: Within functional limits for tasks assessed    SENSATION: WFL  POSTURE:  rounded shoulders, forward head, flexed trunk , and weight shift left  LUMBAR ROM:   Active  Eval  Flexion WNL  Extension 25% limited  Right lateral flexion WFL  Left lateral flexion WFL  Right rotation WFL  Left rotation WFL  (Blank rows = not tested)  MUSCLE LENGTH: Hamstrings: mild tight/WFL B ITB: mild tight/WFL B Piriformis: mod/severe tight B Hip flexors: mild tight B Quads:  Heelcord:   LOWER EXTREMITY ROM:    Limited in B hip IR/ER, otherwise grossly WFL  LOWER EXTREMITY MMT:    MMT Right eval Left eval  Hip flexion 4- 4- p! (Buttock)  Hip extension 3+ 3-  Hip abduction 4- 4-  Hip adduction 3- 3+  Hip internal rotation 4- 4- p! (Buttock)  Hip external rotation 4-  (Limited ROM) 4- p! (Limited ROM)  Knee  flexion 4- 4  Knee extension 4+ 4+  Ankle dorsiflexion 3+ 4-  Ankle plantarflexion 3 3-  Ankle inversion    Ankle eversion     (Blank rows = not tested)   FUNCTIONAL TESTS:  5 times sit to stand: 24.25 sec Timed up and go (TUG): 15.38 sec 10 meter walk test: 13.59 sec Gait speed: 2.41 ft/sec Functional gait assessment: 11/30, < 19 = high risk fall   Functional Gait  Assessment  Gait Level Surface Walks 20 ft, slow speed, abnormal gait pattern, evidence for imbalance or deviates 10-15 in outside of the 12 in walkway width. Requires more than 7 sec to ambulate 20 ft.   Change in Gait Speed Able to change speed, demonstrates mild gait deviations, deviates 6-10 in outside of the 12 in walkway width, or no gait deviations, unable to achieve a major change in velocity, or uses a change in velocity, or uses an assistive device.   Gait with Horizontal Head Turns Performs head turns with moderate changes in gait velocity, slows down, deviates 10-15 in outside 12 in walkway width but recovers, can continue to walk.   Gait with Vertical Head Turns Performs task with slight change in gait velocity (eg, minor disruption to smooth gait path), deviates 6 - 10 in outside 12 in walkway width or uses assistive device   Gait and Pivot Turn Pivot turns safely in greater than 3 sec and stops with no loss of balance, or pivot turns safely within 3 sec and stops with mild imbalance, requires small steps to catch balance.   Step Over Obstacle Is able to step over one shoe box (4.5 in total height) but must slow down and adjust steps to clear box safely. May require verbal cueing.   Gait with Narrow Base of Support Ambulates less than 4 steps heel to toe or cannot perform without assistance.   Gait with Eyes Closed Cannot walk 20 ft without assistance, severe  gait deviations or imbalance, deviates greater than 15 in outside 12 in walkway width or will not attempt task.   Ambulating Backwards Walks 20 ft, slow speed,  abnormal gait pattern, evidence for imbalance, deviates 10-15 in outside 12 in walkway width.   Steps Two feet to a stair, must use rail.   Total Score 11   FGA comment: < 19 = high risk fall      Interpretation of scores: Non-Specific Older Adults Cutoff Score: <=22/30 = risk of falls Parkinson's Disease Cutoff score <15/30= fall risk (Hoehn & Yahr 1-4)  Minimally Clinically Important Difference (MCID)  Stroke (acute, subacute, and chronic) = MDC: 4.2 points Vestibular (acute) = MDC: 6 points Community Dwelling Older Adults =  MCID: 4 points Parkinson's Disease  =  MDC: 4.3 points  (Academy of Neurologic Physical Therapy (nd). Functional Gait Assessment. Retrieved from https://www.neuropt.org/docs/default-source/cpgs/core-outcome-measures/function-gait-assessment-pocket-guide-proof9-(2).pdf?sfvrsn=b72f35043_0.)  Berg Balance Test - 05/03/24  Sit to Stand Able to stand without using hands and stabilize independently   Standing Unsupported Able to stand safely 2 minutes   Sitting with Back Unsupported but Feet Supported on Floor or Stool Able to sit safely and securely 2 minutes   Stand to Sit Sits safely with minimal use of hands   Transfers Able to transfer safely, minor use of hands   Standing Unsupported with Eyes Closed Able to stand 10 seconds safely   Standing Unsupported with Feet Together Able to place feet together independently and stand 1 minute safely   From Standing, Reach Forward with Outstretched Arm Can reach confidently >25 cm (10)   From Standing Position, Pick up Object from Floor Able to pick up shoe safely and easily   From Standing Position, Turn to Look Behind Over each Shoulder Looks behind one side only/other side shows less weight shift   Turn 360 Degrees Able to turn 360 degrees safely in 4 seconds or less   Standing Unsupported, Alternately Place Feet on Step/Stool Able to stand independently and complete 8 steps >20 seconds   Standing Unsupported, One Foot  in Front Able to plae foot ahead of the other independently and hold 30 seconds   Standing on One Leg Tries to lift leg/unable to hold 3 seconds but remains standing independently   Total Score 50   Interpretation 46-51 = Moderate (>50%) fall risk     GAIT: Distance walked: clinic distances Assistive device utilized: None Level of assistance: Complete Independence and SBA Gait pattern: step through pattern, decreased stride length, decreased hip/knee flexion- Right, decreased hip/knee flexion- Left, lateral lean- Left, and trunk flexed Comments: decreased gait speed   TODAY'S TREATMENT:  05/16/24 THERAPEUTIC EXERCISE: To improve strength and endurance.    Nustep L5x97min Supine figure 4 stretch R/L x 1 min Supine KTOS stretch R/L x 1 min  NEUROMUSCULAR RE-EDUCATION: To improve coordination, kinesthesia, and posture. Supine orange pball: DKTC x 10; trunk rotation x 10 B, small range bridges x 10 Tandem walk along wall front of clinic 3x down/back Fwd and retro gait along wall front of clinic 3x down/back- decreased step length L Standing on airex head turns x 5, looking up and looking down x 5 Stepping onto balance pads then to floor 3x for gait on various surfaces SLS with UE support cont knock down and pick up w/  5XSTS- 24.04 sec no UE   05/13/2024  THERAPEUTIC EXERCISE: To improve strength and endurance.    Rec Bike - L2 x 6'  NEUROMUSCULAR RE-EDUCATION: To improve coordination, kinesthesia, and posture. Hooklying:  TrA + GTB hip flexion march 2 x 10 TrA + GTB hip ABD/ER clam 2 x 10 TrA + Bridges with GTB hip ABD isometric 2 x 10 TrA + dead bug x 10 TrA + hip ADD isometric ball squeeze 10 x 5  THERAPEUTIC ACTIVITIES: To improve functional performance.  Demonstration, verbal and tactile cues throughout for technique.  Standing with UE support on counter: Supported squat 2 x 10 - cues to avoid knee fwd of toes and avoid >90/90 squat depth Heel/toe raises x 20  Hip ABD  with looped RTB at distal thighs x 10 Hip extension with looped RTB at distal thighs x 10 Hip flexion march with looped RTB at distal thighs x 10  B side-stepping with looped RTB at distal thighs 3 x 10 ft Fwd/back monster walk with looped RTB at distal thighs 3 x 10 ft   05/08/2024  THERAPEUTIC EXERCISE: To improve strength and endurance.    NuStep - L4 x 6'  THERAPEUTIC ACTIVITIES: To improve functional performance.  Demonstration, verbal and tactile cues throughout for technique.  Standing with UE support on wall ladder: Heel/toe raises x 20  Hip ABD 2 x 10, 2nd set with looped RTB at distal thighs Hip extension 2 x 10, 2nd set with looped RTB at distal thighs Hip flexion march 2 x 10, 2nd set with looped RTB at distal thighs Supported squat x 10  NEUROMUSCULAR RE-EDUCATION: To improve coordination, kinesthesia, and posture.  Seated: TrA + isometric UE press down into blue/green striped ball in lap 10 x 5 TrA + hip ADD isometric ball squeeze 10 x 5 TrA + RTB hip ABD/ER clam x 10 TrA + RTB hip flexion march 2 x 10 TrA + RTB alt unilateral hip ABD/ER clam x 10 Standing: TrA + RTB scap retraction + B shoulder rows x 10 TrA + RTB scap retraction + B shoulder extension x 10   05/03/24 NEUROMUSCULAR RE-EDUCATION: To improve coordination, kinesthesia, posture, and proprioception.  Hooklying Bent knee fallouts RTB with TrA 10x3' Marching RTB with TrA 10x3' Bridges RTB with TRA x 10 R sidelying clamshells x 10--- uncomfortable lying on L side Standing at counter top Heel/toe raises x 10 Mini squats x 10   Standardized Balance Assessment   Standardized Balance Assessment Berg Balance Test      Berg Balance Test   Sit to Stand Able to stand without using hands and stabilize independently    Standing Unsupported Able to stand safely 2 minutes    Sitting with Back Unsupported but Feet Supported on Floor or Stool Able to sit safely and securely 2 minutes    Stand to Sit Sits  safely with minimal use of hands    Transfers Able to transfer safely, minor use of hands    Standing Unsupported with Eyes Closed Able to stand 10 seconds safely    Standing Unsupported with Feet Together Able to place feet together independently and stand 1 minute safely    From Standing, Reach Forward with Outstretched Arm Can reach confidently >25 cm (10)    From Standing Position, Pick up Object from Floor Able to pick up shoe safely and easily    From Standing Position, Turn to Look Behind Over each Shoulder Looks behind one side only/other side shows less weight shift    Turn 360 Degrees Able to turn 360 degrees safely in 4 seconds or less    Standing Unsupported, Alternately Place Feet on Step/Stool Able to stand independently and complete 8  steps >20 seconds    Standing Unsupported, One Foot in Front Able to plae foot ahead of the other independently and hold 30 seconds    Standing on One Leg Tries to lift leg/unable to hold 3 seconds but remains standing independently    Total Score 50      THERAPEUTIC EXERCISE: To improve strength, endurance, ROM, and flexibility.  Bike x 5 min no resistance Reviewed HEP - figure 4 and KTOS stretches   04/29/2024 - Eval  SELF CARE:  Reviewed eval findings and role of PT in addressing identified deficits as well as instruction in initial HEP (see below).    PATIENT EDUCATION:  Education details: HEP update - standing hip strengthening, HEP modification - alternating supine and standing exercises on opp days, and postural awareness  Person educated: Patient Education method: Explanation, Demonstration, Verbal cues, and Handouts Education comprehension: verbalized understanding, returned demonstration, verbal cues required, and needs further education  HOME EXERCISE PROGRAM: THERAPEUTIC ACTIVITIES: To improve functional performance.  Demonstration, verbal and tactile cues throughout for technique.  Standing with UE support on wall  ladder: Heel/toe raises x 20  Hip ABD 2 x 10, 2nd set with looped RTB at distal thighs Hip extension 2 x 10, 2nd set with looped RTB at distal thighs Hip flexion march 2 x 10, 2nd set with looped RTB at distal thighs Supported squat x 10   ASSESSMENT:  CLINICAL IMPRESSION: Session focused primarily on balance walking on various surfaces, along with dynamic gait to improve overall stability. Pt was sore from raking leaves so we did not do much strengthening today. She does show more unsteadiness with gait on compliant surface and with retro gait. She improved 5xSTS score today by 0.20 seconds. Joanna Williams will benefit from continued skilled PT to address ongoing strength and balance deficits to improve mobility and activity tolerance with decreased pain interference and decreased risk for falls.    EVAL: Joanna Williams is a 83 y.o. female who was referred to physical therapy for evaluation and treatment for lumbar spinal stenosis s/p B L4-5 laminectomies and foraminectomies/partial facetectomies.  She reports her pain has been well-controlled since surgery but feels like her legs have gotten weak and her balance is more impaired, causing her to have to be very careful not to fall and preventing her from participating in her PLOF activities including playing pickle ball and walking in her neighborhood.  Patient has deficits in lumbar extension and B hip rotation ROM, proximal B LE flexibility, B LE strength, abnormal posture, and impaired balance which are interfering with ADLs and are impacting quality of life.  On Modified Oswestry patient scored 22/50 demonstrating 44% or severe disability.  Standardized balance testing revealed patient is at risk for falls and functional decline as evidenced by the following objective test measures: Gait speed 2.41 ft/sec, (2.62 ft/sec is needed for community access), TUG of 15.38 sec (>13.5 sec indicates increased risk for falls), and 5xSTS of 24.25 sec (>15  sec indicates increased risk for falls and decreased BLE power). ABC scale score of 57.5% indicates a moderate level of physical functioning.  Joanna Williams will benefit from skilled PT to address above deficits to improve mobility and activity tolerance to help reach the maximal level of functional independence and mobility with decreased pain interference. Patient demonstrates understanding of this POC and is in agreement with this plan.   OBJECTIVE IMPAIRMENTS: Abnormal gait, decreased activity tolerance, decreased balance, decreased endurance, decreased knowledge of condition, decreased mobility, difficulty walking,  decreased ROM, decreased strength, decreased safety awareness, increased fascial restrictions, impaired perceived functional ability, increased muscle spasms, impaired flexibility, improper body mechanics, postural dysfunction, and pain.   ACTIVITY LIMITATIONS: carrying, lifting, bending, sitting, standing, squatting, sleeping, stairs, transfers, bed mobility, and locomotion level  PARTICIPATION LIMITATIONS: meal prep, cleaning, laundry, interpersonal relationship, shopping, community activity, and yard work  PERSONAL FACTORS: Age, Past/current experiences, Time since onset of injury/illness/exacerbation, and 3+ comorbidities: B L4-5 laminectomies & foraminectomies/partial facetectomies 02/19/24; L THA 11/2022, R THA 2013, L carpal tunnel release 11/2023, osteoporosis, GERD, macular degeneration, HLD, insomnia are also affecting patient's functional outcome.   REHAB POTENTIAL: Good  CLINICAL DECISION MAKING: Unstable/unpredictable  EVALUATION COMPLEXITY: High   GOALS: Goals reviewed with patient? Yes  SHORT TERM GOALS: Target date: 05/27/2024  Patient will be independent with initial HEP to improve outcomes and carryover.  Baseline: HEP initiated on eval 05/08/24 - pt reports no concerns but only reviewed squats today, standing heel-toe raises added Goal status: MET -  05/13/24  2.  Patient will improve 5x STS time to </= 20 seconds to demonstrate improved functional strength and transfer efficiency. Baseline: 24.25 sec Goal status: IN PROGRESS- 24.04 seconds 05/16/24  3.  Patient will demonstrate decreased TUG time to </= 13.5 sec to decrease risk for falls with transitional mobility Baseline: 15.38 sec Goal status: INITIAL   LONG TERM GOALS: Target date: 06/24/2024  Patient will be independent with ongoing/advanced HEP for self-management at home.  Baseline:  Goal status: INITIAL  2.  Patient to demonstrate ability to achieve and maintain good spinal alignment/posturing and body mechanics needed for daily activities. Baseline:  Goal status: INITIAL  3.  Patient will demonstrate improved B LE strength to >/= 4 to 4+/5 for improved stability and ease of mobility. Baseline: Refer to above LE MMT table Goal status: INITIAL  4. Patient will report </= 32% on Modified Oswestry (MCID = 12%) to demonstrate improved functional ability with decreased pain interference. Baseline: 22 / 50 = 44.0 % Goal status: INITIAL  5.  Patient will report >/= 70% on ABC scale to demonstrate improved balance confidence and decreased risk for falls. Baseline: 920 / 1600 = 57.5 % Goal status: INITIAL  6.  Patient will be able to resume walking for exercise at least 3x/week without limitation due to LBP or impaired balance.  Baseline:  Goal status: INITIAL   7.  Patient will report ability to resume playing pickle ball without limitation due to low back pain or impaired balance.  Baseline:  Goal status: INITIAL   PLAN:  PT FREQUENCY: 1-2x/week  PT DURATION: 8 weeks  PLANNED INTERVENTIONS: 02835- PT Re-evaluation, 97750- Physical Performance Testing, 97110-Therapeutic exercises, 97530- Therapeutic activity, 97112- Neuromuscular re-education, 97535- Self Care, 02859- Manual therapy, G0283- Electrical stimulation (unattended), [phone]- Ionotophoresis 4mg /ml  Dexamethasone , Patient/Family education, Balance training, Stair training, Taping, Joint mobilization, Spinal mobilization, Cryotherapy, and Moist heat  PLAN FOR NEXT SESSION: progress lumbopelvic and proximal LE strengthening, updating HEP accordingly   Sol LITTIE Gaskins, PTA 05/16/2024, 10:18 AM

## 2024-05-20 ENCOUNTER — Ambulatory Visit: Admitting: Physical Therapy

## 2024-05-21 ENCOUNTER — Other Ambulatory Visit: Payer: Self-pay | Admitting: Family Medicine

## 2024-05-21 DIAGNOSIS — G4700 Insomnia, unspecified: Secondary | ICD-10-CM

## 2024-05-21 NOTE — Telephone Encounter (Signed)
 Requesting: zolpidem  (AMBIEN ) 5 MG tablet  Contract: Yes UDS: Yes 01/15/2024 Last Visit: 01/08/2024 Next Visit: Visit date not found Last Refill: 03/20/2024  Please Advise

## 2024-05-23 ENCOUNTER — Ambulatory Visit: Admitting: Physical Therapy

## 2024-05-27 ENCOUNTER — Ambulatory Visit: Admitting: Physical Therapy

## 2024-06-05 ENCOUNTER — Other Ambulatory Visit: Payer: Self-pay | Admitting: Family Medicine

## 2024-06-05 DIAGNOSIS — E785 Hyperlipidemia, unspecified: Secondary | ICD-10-CM

## 2024-06-06 DIAGNOSIS — M48062 Spinal stenosis, lumbar region with neurogenic claudication: Secondary | ICD-10-CM | POA: Diagnosis not present

## 2024-06-12 DIAGNOSIS — H43813 Vitreous degeneration, bilateral: Secondary | ICD-10-CM | POA: Diagnosis not present

## 2024-06-12 DIAGNOSIS — H353223 Exudative age-related macular degeneration, left eye, with inactive scar: Secondary | ICD-10-CM | POA: Diagnosis not present

## 2024-06-12 DIAGNOSIS — H353211 Exudative age-related macular degeneration, right eye, with active choroidal neovascularization: Secondary | ICD-10-CM | POA: Diagnosis not present

## 2024-06-12 DIAGNOSIS — H26493 Other secondary cataract, bilateral: Secondary | ICD-10-CM | POA: Diagnosis not present

## 2024-07-05 ENCOUNTER — Ambulatory Visit (INDEPENDENT_AMBULATORY_CARE_PROVIDER_SITE_OTHER): Admitting: Family Medicine

## 2024-07-05 ENCOUNTER — Encounter: Payer: Self-pay | Admitting: Family Medicine

## 2024-07-05 VITALS — BP 132/82 | HR 67 | Temp 97.7°F | Resp 18 | Ht 64.0 in | Wt 139.4 lb

## 2024-07-05 DIAGNOSIS — M25542 Pain in joints of left hand: Secondary | ICD-10-CM

## 2024-07-05 DIAGNOSIS — E785 Hyperlipidemia, unspecified: Secondary | ICD-10-CM | POA: Diagnosis not present

## 2024-07-05 DIAGNOSIS — M25541 Pain in joints of right hand: Secondary | ICD-10-CM | POA: Diagnosis not present

## 2024-07-05 DIAGNOSIS — G4700 Insomnia, unspecified: Secondary | ICD-10-CM | POA: Diagnosis not present

## 2024-07-05 LAB — COMPREHENSIVE METABOLIC PANEL WITH GFR
ALT: 21 U/L (ref 3–35)
AST: 25 U/L (ref 5–37)
Albumin: 4.2 g/dL (ref 3.5–5.2)
Alkaline Phosphatase: 111 U/L (ref 39–117)
BUN: 24 mg/dL — ABNORMAL HIGH (ref 6–23)
CO2: 31 meq/L (ref 19–32)
Calcium: 9.2 mg/dL (ref 8.4–10.5)
Chloride: 103 meq/L (ref 96–112)
Creatinine, Ser: 0.69 mg/dL (ref 0.40–1.20)
GFR: 79.63 mL/min
Glucose, Bld: 66 mg/dL — ABNORMAL LOW (ref 70–99)
Potassium: 4.2 meq/L (ref 3.5–5.1)
Sodium: 140 meq/L (ref 135–145)
Total Bilirubin: 0.4 mg/dL (ref 0.2–1.2)
Total Protein: 6.3 g/dL (ref 6.0–8.3)

## 2024-07-05 LAB — LIPID PANEL
Cholesterol: 170 mg/dL (ref 28–200)
HDL: 60.8 mg/dL
LDL Cholesterol: 77 mg/dL (ref 10–99)
NonHDL: 109.69
Total CHOL/HDL Ratio: 3
Triglycerides: 163 mg/dL — ABNORMAL HIGH (ref 10.0–149.0)
VLDL: 32.6 mg/dL (ref 0.0–40.0)

## 2024-07-05 MED ORDER — ATORVASTATIN CALCIUM 40 MG PO TABS: 40.0000 mg | ORAL_TABLET | Freq: Every day | ORAL | 0 refills | Status: AC

## 2024-07-05 MED ORDER — ZOLPIDEM TARTRATE 5 MG PO TABS: 5.0000 mg | ORAL_TABLET | Freq: Every evening | ORAL | 1 refills | Status: AC | PRN

## 2024-07-05 MED ORDER — MELOXICAM 7.5 MG PO TABS: ORAL_TABLET | ORAL | 0 refills | Status: AC

## 2024-07-05 NOTE — Progress Notes (Signed)
 "  Subjective:    Patient ID: Joanna Williams, female    DOB: Jan 07, 1941, 84 y.o.   MRN: [id number]  Chief Complaint  Patient presents with   Hyperlipidemia   Follow-up    HPI Patient is in today for fu hyperlipidemia.  Discussed the use of AI scribe software for clinical note transcription with the patient, who gave verbal consent to proceed.  History of Present Illness Joanna Williams is an 84 year old female who presents with leg weakness following back surgery.  She has been experiencing leg weakness since her back surgery in August, feeling unsteady and cautious when walking. Physical therapy was attempted without significant improvement.  She has developed arthritis pain in her hands, which is not alleviated by Tylenol  Extra Strength or Tylenol  Arthritis. Topical aspirin  provides some relief at night, but frequent hand washing complicates its use during the day.  Her current medications include atorvastatin  for cholesterol, a medication for reflux, and eye vitamins. She has previously taken meloxicam  for back pain and is considering its use for arthritis pain. She also has gabapentin  but has not been taking it regularly.    Past Medical History:  Diagnosis Date   GERD (gastroesophageal reflux disease)    Heart murmur    Hyperlipidemia    Macular degeneration 05/04/2017    Past Surgical History:  Procedure Laterality Date   CARPAL TUNNEL RELEASE Left 11/2023   gramig   CATARACT EXTRACTION Bilateral 2014   COLONOSCOPY  2 yrs ago   LUMBAR LAMINECTOMY/DECOMPRESSION MICRODISCECTOMY Bilateral 02/19/2024   Procedure: LUMBAR LAMINECTOMY/DECOMPRESSION MICRODISCECTOMY 1 LEVEL;  Surgeon: Onetha Kuba, MD;  Location: [facility] OR;  Service: Neurosurgery;  Laterality: Bilateral;  Laminectomy and Foraminotomy - L4-L5 - bilateral   TOTAL HIP ARTHROPLASTY  08/10/2011   Procedure: TOTAL HIP ARTHROPLASTY;  Surgeon: Dempsey JINNY Sensor, MD;  Location: [facility] OR;  Service: Orthopedics;  Laterality:  Right;  DEPUY/ PENNACLE POLY OR CERAMIC   TOTAL HIP ARTHROPLASTY Left 11/08/2022   Procedure: TOTAL HIP ARTHROPLASTY;  Surgeon: Josefina Chew, MD;  Location: [facility] ORS;  Service: Orthopedics;  Laterality: Left;    Family History  Problem Relation Age of Onset   Heart disease Mother    Cancer Father 28       brain   Cancer Sister    Macular degeneration Sister    Parkinson's disease Sister     Social History   Socioeconomic History   Marital status: Married    Spouse name: Not on file   Number of children: Not on file   Years of education: Not on file   Highest education level: Not on file  Occupational History   Occupation: retired    Associate Professor: RETIRED  Tobacco Use   Smoking status: Never   Smokeless tobacco: Never  Vaping Use   Vaping status: Never Used  Substance and Sexual Activity   Alcohol use: Not Currently    Alcohol/week: 1.0 standard drink of alcohol    Types: 1 Glasses of wine per week    Comment: rare   Drug use: Not Currently   Sexual activity: Yes    Partners: Male  Other Topics Concern   Not on file  Social History Narrative   Walking, yoga, pickle ball   Social Drivers of Health   Tobacco Use: Low Risk (07/05/2024)   Patient History    Smoking Tobacco Use: Never    Smokeless Tobacco Use: Never    Passive Exposure: Not on file  Financial Resource Strain:  Low Risk (10/26/2023)   Overall Financial Resource Strain (CARDIA)    Difficulty of Paying Living Expenses: Not hard at all  Food Insecurity: No Food Insecurity (10/26/2023)   Hunger Vital Sign    Worried About Running Out of Food in the Last Year: Never true    Ran Out of Food in the Last Year: Never true  Transportation Needs: No Transportation Needs (10/26/2023)   PRAPARE - Administrator, Civil Service (Medical): No    Lack of Transportation (Non-Medical): No  Physical Activity: Insufficiently Active (10/26/2023)   Exercise Vital Sign    Days of Exercise per Week: 3 days    Minutes  of Exercise per Session: 30 min  Stress: No Stress Concern Present (10/26/2023)   [employer] of Occupational Health - Occupational Stress Questionnaire    Feeling of Stress : Not at all  Social Connections: Moderately Integrated (10/26/2023)   Social Connection and Isolation Panel    Frequency of Communication with Friends and Family: More than three times a week    Frequency of Social Gatherings with Friends and Family: Three times a week    Attends Religious Services: More than 4 times per year    Active Member of Clubs or Organizations: No    Attends Banker Meetings: Never    Marital Status: Married  Catering Manager Violence: Not At Risk (10/26/2023)   Humiliation, Afraid, Rape, and Kick questionnaire    Fear of Current or Ex-Partner: No    Emotionally Abused: No    Physically Abused: No    Sexually Abused: No  Depression (PHQ2-9): Low Risk (10/26/2023)   Depression (PHQ2-9)    PHQ-2 Score: 1  Alcohol Screen: Low Risk (10/26/2023)   Alcohol Screen    Last Alcohol Screening Score (AUDIT): 0  Housing: Unknown (10/26/2023)   Housing Stability Vital Sign    Unable to Pay for Housing in the Last Year: No    Number of Times Moved in the Last Year: Not on file    Homeless in the Last Year: No  Utilities: Not At Risk (10/26/2023)   [employer] Utilities    Threatened with loss of utilities: No  Health Literacy: Adequate Health Literacy (10/26/2023)   B1300 Health Literacy    Frequency of need for help with medical instructions: Never    Outpatient Medications Prior to Visit  Medication Sig Dispense Refill   Multiple Vitamins-Minerals (PRESERVISION AREDS 2+MULTI VIT PO) Take 1 tablet by mouth at bedtime.     omeprazole  (PRILOSEC ) 20 MG capsule Take 1 capsule (20 mg total) by mouth daily. (Patient taking differently: Take 20 mg by mouth as needed.) 90 capsule 1   trolamine salicylate (ASPERCREME) 10 % cream Apply 1 Application topically as needed for muscle pain.      atorvastatin  (LIPITOR) 40 MG tablet TAKE 1 TABLET BY MOUTH EVERY DAY 90 tablet 0   zolpidem  (AMBIEN ) 5 MG tablet Take 1 tablet (5 mg total) by mouth at bedtime as needed. 30 tablet 1   cyclobenzaprine  (FLEXERIL ) 10 MG tablet Take 1 tablet (10 mg total) by mouth 3 (three) times daily as needed for muscle spasms. (Patient not taking: Reported on 07/05/2024) 30 tablet 0   gabapentin  (NEURONTIN ) 300 MG capsule Take 300 mg by mouth 2 (two) times daily. (Patient not taking: Reported on 07/05/2024)     HYDROcodone -acetaminophen  (NORCO/VICODIN) 5-325 MG tablet Take 1 tablet by mouth every 4 (four) hours as needed for moderate pain (pain score 4-6). (Patient not  taking: Reported on 07/05/2024) 30 tablet 0   meloxicam  (MOBIC ) 7.5 MG tablet TAKE 1 TO 2 TABLETS BY MOUTH DAILY (Patient not taking: Reported on 07/05/2024) 60 tablet 2   No facility-administered medications prior to visit.    Allergies  Allergen Reactions   Niacin Nausea Only    Review of Systems  Constitutional:  Negative for fever and malaise/fatigue.  HENT:  Negative for congestion.   Eyes:  Negative for blurred vision.  Respiratory:  Negative for shortness of breath.   Cardiovascular:  Negative for chest pain, palpitations and leg swelling.  Gastrointestinal:  Negative for abdominal pain, blood in stool and nausea.  Genitourinary:  Negative for dysuria and frequency.  Musculoskeletal:  Negative for falls.  Skin:  Negative for rash.  Neurological:  Negative for dizziness, loss of consciousness and headaches.  Endo/Heme/Allergies:  Negative for environmental allergies.  Psychiatric/Behavioral:  Negative for depression. The patient is not nervous/anxious.        Objective:    Physical Exam Vitals and nursing note reviewed.  Constitutional:      General: She is not in acute distress.    Appearance: Normal appearance. She is well-developed.  HENT:     Head: Normocephalic and atraumatic.  Eyes:     General: No scleral icterus.        Right eye: No discharge.        Left eye: No discharge.  Cardiovascular:     Rate and Rhythm: Normal rate and regular rhythm.     Heart sounds: No murmur heard. Pulmonary:     Effort: Pulmonary effort is normal. No respiratory distress.     Breath sounds: Normal breath sounds.  Musculoskeletal:        General: Normal range of motion.     Cervical back: Normal range of motion and neck supple.     Right lower leg: No edema.     Left lower leg: No edema.  Skin:    General: Skin is warm and dry.  Neurological:     Mental Status: She is alert and oriented to person, place, and time.  Psychiatric:        Mood and Affect: Mood normal.        Behavior: Behavior normal.        Thought Content: Thought content normal.        Judgment: Judgment normal.     BP 132/82 (BP Location: Left Arm, Patient Position: Sitting, Cuff Size: Normal)   Pulse 67   Temp 97.7 F (36.5 C) (Oral)   Resp 18   Ht 5' 4 (1.626 m)   Wt 139 lb 6.4 oz (63.2 kg)   SpO2 94%   BMI 23.93 kg/m  Wt Readings from Last 3 Encounters:  07/05/24 139 lb 6.4 oz (63.2 kg)  02/19/24 135 lb (61.2 kg)  02/12/24 137 lb 3.2 oz (62.2 kg)    Diabetic Foot Exam - Simple   No data filed    Lab Results  Component Value Date   WBC 8.3 02/12/2024   HGB 14.1 02/12/2024   HCT 44.7 02/12/2024   PLT 230 02/12/2024   GLUCOSE 66 (L) 07/05/2024   CHOL 170 07/05/2024   TRIG 163.0 (H) 07/05/2024   HDL 60.80 07/05/2024   LDLDIRECT 84.0 12/14/2020   LDLCALC 77 07/05/2024   ALT 21 07/05/2024   AST 25 07/05/2024   NA 140 07/05/2024   K 4.2 07/05/2024   CL 103 07/05/2024   CREATININE 0.69 07/05/2024  BUN 24 (H) 07/05/2024   CO2 31 07/05/2024   TSH 4.87 01/02/2024   INR 1.50 (H) 08/13/2011   HGBA1C 5.8 08/13/2012    Lab Results  Component Value Date   TSH 4.87 01/02/2024   Lab Results  Component Value Date   WBC 8.3 02/12/2024   HGB 14.1 02/12/2024   HCT 44.7 02/12/2024   MCV 100.0 02/12/2024   PLT 230 02/12/2024    Lab Results  Component Value Date   NA 140 07/05/2024   K 4.2 07/05/2024   CO2 31 07/05/2024   GLUCOSE 66 (L) 07/05/2024   BUN 24 (H) 07/05/2024   CREATININE 0.69 07/05/2024   BILITOT 0.4 07/05/2024   ALKPHOS 111 07/05/2024   AST 25 07/05/2024   ALT 21 07/05/2024   PROT 6.3 07/05/2024   ALBUMIN 4.2 07/05/2024   CALCIUM  9.2 07/05/2024   ANIONGAP 8 02/12/2024   GFR 79.63 07/05/2024   Lab Results  Component Value Date   CHOL 170 07/05/2024   Lab Results  Component Value Date   HDL 60.80 07/05/2024   Lab Results  Component Value Date   LDLCALC 77 07/05/2024   Lab Results  Component Value Date   TRIG 163.0 (H) 07/05/2024   Lab Results  Component Value Date   CHOLHDL 3 07/05/2024   Lab Results  Component Value Date   HGBA1C 5.8 08/13/2012       Assessment & Plan:  Hyperlipidemia, unspecified hyperlipidemia type -     Comprehensive metabolic panel with GFR -     Lipid panel -     Atorvastatin  Calcium ; Take 1 tablet (40 mg total) by mouth daily.  Dispense: 90 tablet; Refill: 0  Insomnia, unspecified type -     Zolpidem  Tartrate; Take 1 tablet (5 mg total) by mouth at bedtime as needed.  Dispense: 30 tablet; Refill: 1  Arthralgia of both hands -     Rheumatoid factor -     Meloxicam ; 1-2 po every day as needed pain  Dispense: 30 tablet; Refill: 0  Assessment and Plan Assessment & Plan Arthralgia of both hands   Recent onset of arthralgia in both hands, possibly related to arthritis. Tylenol  arthritis is ineffective. Differential diagnosis includes rheumatoid arthritis. Ordered rheumatoid factor test and prescribed meloxicam  for arthritis pain. Will consider referral to hand specialist if symptoms worsen.  Muscle weakness following back surgery   Muscle weakness in legs following back surgery in August. Physical therapy was ineffective. Weakness likely due to nerve effects from surgery, expected to improve over a year. Encouraged exercises to strengthen legs  and core. Recommended chair yoga or swimming for balance and strength. Advised against pickleball until strength improves.  Hyperlipidemia   Currently managed with atorvastatin . Continue atorvastatin  for hyperlipidemia management.    Sabreena Vogan R Lowne Chase, DO "

## 2024-07-08 ENCOUNTER — Ambulatory Visit: Payer: Self-pay | Admitting: Family Medicine

## 2024-07-09 LAB — RHEUMATOID FACTOR: Rheumatoid fact SerPl-aCnc: <10 [IU]/mL

## 2024-11-05 ENCOUNTER — Ambulatory Visit

## 2025-01-02 ENCOUNTER — Ambulatory Visit: Admitting: Family Medicine
